# Patient Record
Sex: Male | Born: 1955
Health system: Southern US, Community
[De-identification: ages and names within clinical notes are randomized; demographics above are authoritative.]

## PROBLEM LIST (undated history)

## (undated) ENCOUNTER — Ambulatory Visit: Admission: EM | Payer: BC Managed Care – PPO

## (undated) DIAGNOSIS — Z8701 Personal history of pneumonia (recurrent): Secondary | ICD-10-CM

## (undated) DIAGNOSIS — K5792 Diverticulitis of intestine, part unspecified, without perforation or abscess without bleeding: Secondary | ICD-10-CM

## (undated) DIAGNOSIS — E119 Type 2 diabetes mellitus without complications: Secondary | ICD-10-CM

## (undated) DIAGNOSIS — Z8619 Personal history of other infectious and parasitic diseases: Secondary | ICD-10-CM

## (undated) DIAGNOSIS — I209 Angina pectoris, unspecified: Secondary | ICD-10-CM

## (undated) DIAGNOSIS — E785 Hyperlipidemia, unspecified: Secondary | ICD-10-CM

## (undated) DIAGNOSIS — M1990 Unspecified osteoarthritis, unspecified site: Secondary | ICD-10-CM

## (undated) DIAGNOSIS — I10 Essential (primary) hypertension: Secondary | ICD-10-CM

## (undated) DIAGNOSIS — H40009 Preglaucoma, unspecified, unspecified eye: Secondary | ICD-10-CM

## (undated) DIAGNOSIS — K219 Gastro-esophageal reflux disease without esophagitis: Secondary | ICD-10-CM

## (undated) DIAGNOSIS — I2510 Atherosclerotic heart disease of native coronary artery without angina pectoris: Secondary | ICD-10-CM

## (undated) HISTORY — DX: Unspecified osteoarthritis, unspecified site: M19.90

## (undated) HISTORY — DX: Hyperlipidemia, unspecified: E78.5

## (undated) HISTORY — PX: TONSILLECTOMY: SUR1361

## (undated) HISTORY — DX: Type 2 diabetes mellitus without complications: E11.9

## (undated) HISTORY — DX: Diverticulitis of intestine, part unspecified, without perforation or abscess without bleeding: K57.92

## (undated) HISTORY — PX: SKIN CANCER EXCISION: SHX779

## (undated) HISTORY — DX: Personal history of other infectious and parasitic diseases: Z86.19

## (undated) HISTORY — DX: Personal history of pneumonia (recurrent): Z87.01

## (undated) HISTORY — DX: Essential (primary) hypertension: I10

## (undated) HISTORY — DX: Atherosclerotic heart disease of native coronary artery without angina pectoris: I25.10

## (undated) HISTORY — DX: Preglaucoma, unspecified, unspecified eye: H40.009

---

## 2003-06-14 ENCOUNTER — Other Ambulatory Visit: Payer: Self-pay

## 2005-09-07 ENCOUNTER — Emergency Department: Payer: Self-pay | Admitting: Unknown Physician Specialty

## 2006-07-25 DIAGNOSIS — I2510 Atherosclerotic heart disease of native coronary artery without angina pectoris: Secondary | ICD-10-CM

## 2006-07-25 HISTORY — DX: Atherosclerotic heart disease of native coronary artery without angina pectoris: I25.10

## 2006-07-25 HISTORY — PX: OTHER SURGICAL HISTORY: SHX169

## 2007-03-14 ENCOUNTER — Emergency Department: Payer: Self-pay | Admitting: Emergency Medicine

## 2007-03-20 ENCOUNTER — Ambulatory Visit: Payer: Self-pay | Admitting: Specialist

## 2007-03-26 HISTORY — PX: CORONARY STENT PLACEMENT: SHX1402

## 2007-04-05 ENCOUNTER — Inpatient Hospital Stay: Payer: Self-pay | Admitting: Cardiovascular Disease

## 2007-04-05 ENCOUNTER — Encounter: Payer: Self-pay | Admitting: Cardiovascular Disease

## 2007-04-06 ENCOUNTER — Other Ambulatory Visit: Payer: Self-pay

## 2007-04-06 ENCOUNTER — Encounter: Payer: Self-pay | Admitting: Cardiovascular Disease

## 2007-04-16 ENCOUNTER — Ambulatory Visit (HOSPITAL_COMMUNITY): Admission: RE | Admit: 2007-04-16 | Discharge: 2007-04-16 | Payer: Self-pay | Admitting: *Deleted

## 2007-04-16 ENCOUNTER — Encounter: Payer: Self-pay | Admitting: Cardiovascular Disease

## 2008-12-09 ENCOUNTER — Ambulatory Visit: Payer: Self-pay | Admitting: Gastroenterology

## 2009-02-13 ENCOUNTER — Encounter: Payer: Self-pay | Admitting: Cardiovascular Disease

## 2009-03-21 ENCOUNTER — Emergency Department: Payer: Self-pay | Admitting: Emergency Medicine

## 2009-06-16 ENCOUNTER — Encounter: Payer: Self-pay | Admitting: Cardiovascular Disease

## 2009-06-16 LAB — CONVERTED CEMR LAB
ALT: 45 units/L
AST: 29 units/L
Albumin: 4.6 g/dL
Alkaline Phosphatase: 60 units/L
BUN: 10 mg/dL
CO2: 23 meq/L
Calcium: 9.3 mg/dL
Chloride: 101 meq/L
Creatinine, Ser: 0.86 mg/dL
Glucose, Bld: 87 mg/dL
HCT: 48.8 %
Hemoglobin: 16.2 g/dL
MCV: 90 fL
Potassium: 4.4 meq/L
RBC: 5.42 M/uL
RDW: 13.9 %
Sodium: 134 meq/L
Total Bilirubin: 0.5 mg/dL
Total Protein: 6.8 g/dL
WBC: 7.3 10*3/uL

## 2009-07-06 ENCOUNTER — Encounter: Payer: Self-pay | Admitting: Cardiovascular Disease

## 2009-07-25 HISTORY — PX: COLONOSCOPY: SHX174

## 2009-11-30 ENCOUNTER — Ambulatory Visit: Payer: Self-pay | Admitting: Gastroenterology

## 2009-11-30 LAB — HM COLONOSCOPY

## 2009-12-22 ENCOUNTER — Encounter: Payer: Self-pay | Admitting: Cardiovascular Disease

## 2009-12-22 ENCOUNTER — Ambulatory Visit: Payer: Self-pay

## 2009-12-22 ENCOUNTER — Encounter (HOSPITAL_COMMUNITY): Admission: RE | Admit: 2009-12-22 | Discharge: 2010-02-23 | Payer: Self-pay | Admitting: Cardiovascular Disease

## 2009-12-22 ENCOUNTER — Telehealth (INDEPENDENT_AMBULATORY_CARE_PROVIDER_SITE_OTHER): Payer: Self-pay

## 2009-12-22 ENCOUNTER — Ambulatory Visit: Payer: Self-pay | Admitting: Cardiology

## 2010-01-19 ENCOUNTER — Ambulatory Visit: Payer: Self-pay | Admitting: Gastroenterology

## 2010-02-04 ENCOUNTER — Encounter: Payer: Self-pay | Admitting: Cardiovascular Disease

## 2010-03-01 ENCOUNTER — Encounter: Payer: Self-pay | Admitting: Cardiovascular Disease

## 2010-05-11 ENCOUNTER — Encounter: Payer: Self-pay | Admitting: Cardiovascular Disease

## 2010-06-10 ENCOUNTER — Telehealth: Payer: Self-pay | Admitting: Cardiovascular Disease

## 2010-06-15 ENCOUNTER — Ambulatory Visit: Payer: Self-pay | Admitting: Cardiovascular Disease

## 2010-06-15 DIAGNOSIS — E785 Hyperlipidemia, unspecified: Secondary | ICD-10-CM

## 2010-06-15 DIAGNOSIS — E1169 Type 2 diabetes mellitus with other specified complication: Secondary | ICD-10-CM | POA: Insufficient documentation

## 2010-06-15 DIAGNOSIS — I2510 Atherosclerotic heart disease of native coronary artery without angina pectoris: Secondary | ICD-10-CM | POA: Insufficient documentation

## 2010-06-15 DIAGNOSIS — I10 Essential (primary) hypertension: Secondary | ICD-10-CM | POA: Insufficient documentation

## 2010-06-28 ENCOUNTER — Telehealth: Payer: Self-pay | Admitting: Cardiovascular Disease

## 2010-08-17 ENCOUNTER — Encounter: Payer: Self-pay | Admitting: Cardiovascular Disease

## 2010-08-24 ENCOUNTER — Telehealth: Payer: Self-pay | Admitting: Cardiovascular Disease

## 2010-08-24 NOTE — Assessment & Plan Note (Signed)
Summary: Cardiology Nuclear Study  Nuclear Med Background Indications for Stress Test: Evaluation for Ischemia   History: Heart Catheterization, Myocardial Perfusion Study, Stents  History Comments: 04/06/07 MPS: (+)> Cath: RCA stents x2 overlapping. 04/16/07 Cath: patent RCA stents, residual 60% LAD per intravascular [place], N/O CAD.  Symptoms: Chest Pain, Palpitations    Nuclear Pre-Procedure Cardiac Risk Factors: Hypertension, Lipids, Smoker Caffeine/Decaff Intake: None NPO After: 10:00 PM Lungs: clear IV 0.9% NS with Angio Cath: 18G     IV Site: (R) AC IV Started by: Stanton Kidney EMT-P Chest Size (in) 46-48     Height (in): 69 Weight (lb): 233 BMI: 34.53  Nuclear Med Study 1 or 2 day study:  1 day     Stress Test Type:  Stress Reading MD:  Marca Ancona, MD     Referring MD:  Julien Nordmann Resting Radionuclide:  Technetium 22m Tetrofosmin     Resting Radionuclide Dose:  11 mCi  Stress Radionuclide:  Technetium 49m Tetrofosmin     Stress Radionuclide Dose:  33 mCi   Stress Protocol Exercise Time (min):  9:30 min     Max HR:  160 bpm     Predicted Max HR:  168 bpm       Percent Max HR:  95.24 %     METS: 10.9    Stress Test Technologist:  Milana Na EMT-P     Nuclear Technologist:  Domenic Polite CNMT  Rest Procedure  Myocardial perfusion imaging was performed at rest 45 minutes following the intravenous administration of Myoview Technetium 65m Tetrofosmin.  Stress Procedure  The patient exercised for 9:30. The patient stopped due to fatigue and denied any chest pain.  There were no significant ST-T wave changes and occ pvcs: Bigemeny.  Myoview was injected at peak exercise and myocardial perfusion imaging was performed after a brief delay.  QPS Raw Data Images:  Normal; no motion artifact; normal heart/lung ratio. Stress Images:  Very small, mild apical perfusion defect.  Rest Images:  Small, mild apical perfusion defect and small mid-anterior perfusion defect.    Subtraction (SDS):  Small, mild fixed apical perfusion defect and small mid anterior perfusion defect seen only on rest imaging and likely artifactual.  Transient Ischemic Dilatation:  .92  (Normal <1.22)  Lung/Heart Ratio:  .38  (Normal <0.45)  Quantitative Gated Spect Images QGS EDV:  119 ml QGS ESV:  44 ml QGS EF:  63 % QGS cine images:  Normal wall motion.    Overall Impression  Exercise Capacity: Good exercise capacity. BP Response: Hypertensive blood pressure response. Clinical Symptoms: Mild chest pain even before study begun, no change with exercise.  ECG Impression: Baseline NSR with poor anterior R wave progression.  Borderline significant ST depression in the inferior leads.  Brief ventricular bigeminy during exercise.  Overall Impression: Probable apical thinning with no ischemia and no definite evidence for infarction. The stress perfusion looks near-normal (better than rest imaging).  Overall Impression Comments: Good exercise tolerance with hypertensive BP response.  Borderline ECG changes in inferior leads are nonspecific.  EF and wall motion normal.   Appended Document: Cardiology Nuclear Study I have discussed results with patient. We will monitor his signs for now. medical management

## 2010-08-24 NOTE — Assessment & Plan Note (Signed)
Summary: ROV   Visit Type:  Follow-up Primary Provider:  Orson Aloe, M.D.  CC:  Denies chest pain or shortness of breath..  History of Present Illness: [name] is 55 year old gentleman with known coronary artery disease, stent placed in his proximal to mid RCA in September 2008, remote history of smoking stopped in 08 recent episodes of chest pain earlier this year with negative stress test at that time who presented to reestablish care. He was seen [facility] last in December 2010.  He reports that he has been doing well. He denies any chest pain. 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 He has had back and knee pain. He has trouble sleeping.  LDL in November 2010 with 77.  More recently it was in the 80s. He is an obese and has difficulty with his weight.  Stress test was last in May 2011 showing no significant. Prior stress test was September 2008  Normal sinus rhythm with rate 73 beats per minute, no significant ST or T wave changes  Preventive Screening-Counseling & Management  Alcohol-Tobacco     Smoking Status: never  Caffeine-Diet-Exercise     Does Patient Exercise: yes      Drug Use:  no.    Current Medications (verified): 1)  Plavix 75 Mg Tabs (Clopidogrel Bisulfate) .[name].. 1 Tab Once Daily 2)  Aspirin 325 Mg Tabs (Aspirin) .[name].. 1 Tab Once Daily 3)  Crestor 20 Mg Tabs (Rosuvastatin Calcium) .[name].. 1 Tablet Once Daily With Every Other Day of 40 Mg Qhs 4)  Enalapril Maleate 20 Mg Tabs (Enalapril Maleate) .... One Tablet Once Daily 5)  Nitrostat 0.4 Mg Subl (Nitroglycerin) .[name].. 1 Tablet Under Tongue At Onset of Chest Pain; You May Repeat Every 5 Minutes For Up To 3 Doses. 6)  Omeprazole 40 Mg Cpdr (Omeprazole) .... One Tablet Once Daily 7)  Fish Oil Concentrate 1000 Mg Caps (Omega-3 Fatty Acids) .... One Tablet Two Times A Day 8)  Centrum Silver  Tabs (Multiple  Vitamins-Minerals) .... One Tablet Once Daily 9)  Glucosamine 500 Mg Caps (Glucosamine Sulfate) .... One Tablet Once Daily  Allergies (verified): No Known Drug Allergies  Past History:  Past Surgical History: Last updated: 02/04/2010 Stent placed in September of 2008  Family History: Last updated: 07/06/10 Father: Deceased; old age Mother: Deceased lung & liver cancer   Social History: Last updated: 2010/07/06 Full Time Single  Tobacco Use - No.  Alcohol Use - yes Regular Exercise - yes Drug Use - no  Risk Factors: Exercise: yes (07-06-10)  Risk Factors: Smoking Status: never (07-06-2010)  Past Medical History: CAD Hyperlipidemia Hypertension  Family History: Father: Deceased; old age Mother: Deceased lung & liver cancer   Social History: Full Time Single  Tobacco Use - No.  Alcohol Use - yes Regular Exercise - yes Drug Use - no Smoking Status:  never Does Patient Exercise:  yes Drug Use:  no  Review of Systems  The patient denies fever, weight loss, weight gain, vision loss, decreased hearing, hoarseness, chest pain, syncope, dyspnea on exertion, peripheral edema, prolonged cough, abdominal pain, incontinence, muscle weakness, depression, and enlarged lymph nodes.    Vital Signs:  Patient profile:   55 year old male Height:      69 inches Weight:      232 pounds BMI:     34.38 Pulse rate:   72 / minute BP sitting:  152 / 92  (left arm) Cuff size:   large  Vitals Entered By: Bishop Dublin, CMA (June 15, 2010 10:52 AM)  Physical Exam  General:  Well developed, well nourished, in no acute distress. Head:  normocephalic and atraumatic Neck:  Neck supple, no JVD. No masses, thyromegaly or abnormal cervical nodes. Lungs:  Clear bilaterally to auscultation and percussion. Heart:  Non-displaced PMI, chest non-tender; regular rate and rhythm, S1, S2 without murmurs, rubs or gallops. Carotid upstroke normal, no bruit. Pedals normal pulses. No  edema, no varicosities. Abdomen:  Bowel sounds positive; abdomen soft and non-tender without masses Msk:  Back normal, normal gait. Muscle strength and tone normal. Pulses:  pulses normal in all 4 extremities Extremities:  No clubbing or cyanosis. Neurologic:  Alert and oriented x 3. Skin:  Intact without lesions or rashes. Psych:  Normal affect.   Impression & Recommendations:  Problem # 1:  CAD, NATIVE VESSEL (ICD-414.01) no symptoms of angina at this time. Recent negative stress test  His updated medication list for this problem includes:    Plavix 75 Mg Tabs (Clopidogrel bisulfate) .[name]... 1 tab once daily    Aspirin 325 Mg Tabs (Aspirin) .[name]... 1 tab once daily    Enalapril Maleate 20 Mg Tabs (Enalapril maleate) ..... One tablet once daily    Nitrostat 0.4 Mg Subl (Nitroglycerin) .[name]... 1 tablet under tongue at onset of chest pain; you may repeat every 5 minutes for up to 3 doses.  Problem # 2:  HYPERTENSION, BENIGN (ICD-401.1) Blood pressure is borderline elevated today. He reports good blood pressures at home. We will have him monitor his blood pressure at home.  His updated medication list for this problem includes:    Aspirin 325 Mg Tabs (Aspirin) .[name]... 1 tab once daily    Enalapril Maleate 20 Mg Tabs (Enalapril maleate) ..... One tablet once daily  Problem # 3:  OVERWEIGHT/OBESITY (ICD-278.02) We have encouraged him to exercise, watch his diet in an effort to lose weight  Problem # 4:  HYPERLIPIDEMIA-MIXED (ICD-272.4) cholesterol is not at goal. We have encouraged him to weight  His updated medication list for this problem includes:    Crestor 20 Mg Tabs (Rosuvastatin calcium) .[name]... 1 tablet once daily with every other day of 40 mg qhs  Patient Instructions: 1)  Your physician recommends that you schedule a follow-up appointment in: 1 year 2)  Your physician has recommended you make the following change in your medication: Start taking Cialis 20mg  as needed.    Prescriptions: CIALIS 5 MG TABS (TADALAFIL) Take 1 tablet by mouth once a day  #30 x 0   Entered by:   Cloyde Reams RN   Authorized by:   Dossie Arbour MD   Signed by:   Cloyde Reams RN on 06/15/2010   Method used:   Print then Give to Patient   RxID:   [id number]

## 2010-08-24 NOTE — Progress Notes (Signed)
Summary: Nuc. Pre-Procedure  Phone Note Outgoing Call Call back at [facility] Phone [phone]   Call placed by: Irean Hong, RN,  Dec 22, 2009 10:42 AM Summary of Call: Left message with information on Myoview Information Sheet (see scanned document for details).      Nuclear Med Background Indications for Stress Test: Evaluation for Ischemia   History: Heart Catheterization  History Comments: 04/06/07 RCA stents x2 overlapping. 04/16/07 Cath: patent RCA stents, residual 60% LAD per intravascular [place], N/O CAD.     Nuclear Pre-Procedure Cardiac Risk Factors: Hypertension, Lipids, Smoker

## 2010-08-24 NOTE — Progress Notes (Signed)
Summary: RX  Phone Note Refill Request Call back at Home Phone [phone] Message from:  Patient on June 28, 2010 3:20 PM  Refills Requested: Medication #1:  CRESTOR 20 MG TABS 1 tablet once daily with every other day of 40 mg qhs   Notes: SHOULD BE 40 MG [facility]  Initial call taken by: Harlon Flor,  June 28, 2010 3:21 PM    Prescriptions: CRESTOR 20 MG TABS (ROSUVASTATIN CALCIUM) 1 tablet once daily with every other day of 40 mg qhs  #30 x 6   Entered by:   Lysbeth Galas CMA   Authorized by:   Dossie Arbour MD   Signed by:   Lysbeth Galas CMA on 06/28/2010   Method used:   Electronically to        [facility] [phone]* (retail)       [address] [place], [place]  [address]       Ph: [phone]       Fax: [phone]   RxID:   [id number]   Appended Document: RX Crestor per append to lab result pt should be taking Crestor 40mg  once daily. New Rx sent to Pharmacy. [facility]   Clinical Lists Changes  Medications: Changed medication from CRESTOR 20 MG TABS (ROSUVASTATIN CALCIUM) 1 tablet once daily with every other day of 40 mg qhs to CRESTOR 40 MG TABS (ROSUVASTATIN CALCIUM) Take one tablet by mouth daily. - Signed Rx of CRESTOR 40 MG TABS (ROSUVASTATIN CALCIUM) Take one tablet by mouth daily.;  #30 x 6;  Signed;  Entered by: Lanny Hurst RN;  Authorized by: Dossie Arbour MD;  Method used: Electronically to [facility] [phone]*, [address] [place], [place], [place]  [address], Ph: [phone], Fax: [phone]    Prescriptions: CRESTOR 40 MG TABS (ROSUVASTATIN CALCIUM) Take one tablet by mouth daily.  #30 x 6   Entered by:   Lanny Hurst RN   Authorized by:   Dossie Arbour MD   Signed by:   Lanny Hurst RN on 06/28/2010   Method used:   Electronically to        [facility] [phone]* (retail)       [address] [place], [place]  [address]       Ph: [phone]       Fax: [phone]  RxID:   [id number]

## 2010-08-24 NOTE — Letter (Signed)
Summary: Medical Record Release  Medical Record Release   Imported By: Harlon Flor 06/02/2010 15:54:21  _____________________________________________________________________  External Attachment:    Type:   Image     Comment:   External Document

## 2010-08-24 NOTE — Cardiovascular Report (Signed)
[facility]   [facility] Cath Report   Imported By: Roderic Ovens 06/30/2010 15:30:06  _____________________________________________________________________  External Attachment:    Type:   Image     Comment:   External Document

## 2010-08-24 NOTE — Miscellaneous (Signed)
Summary: Nitro  Clinical Lists Changes  Medications: Added new medication of NITROSTAT 0.4 MG SUBL (NITROGLYCERIN) 1 tablet under tongue at onset of chest pain; you may repeat every 5 minutes for up to 3 doses. - Signed Rx of NITROSTAT 0.4 MG SUBL (NITROGLYCERIN) 1 tablet under tongue at onset of chest pain; you may repeat every 5 minutes for up to 3 doses.;  #25 x 2;  Signed;  Entered by: Benedict Needy, RN;  Authorized by: Dossie Arbour MD;  Method used: Electronically to [facility]. [phone]*, [address] [place], [place], [place]  [address], Ph: [phone], Fax: [phone]    Prescriptions: NITROSTAT 0.4 MG SUBL (NITROGLYCERIN) 1 tablet under tongue at onset of chest pain; you may repeat every 5 minutes for up to 3 doses.  #25 x 2   Entered by:   Benedict Needy, RN   Authorized by:   Dossie Arbour MD   Signed by:   Benedict Needy, RN on 03/01/2010   Method used:   Electronically to        [facility] [phone]* (retail)       [address] [place], [place]  [address]       Ph: [phone]       Fax: [phone]   RxID:   [id number]

## 2010-08-24 NOTE — Progress Notes (Signed)
Summary: RESULTS  Phone Note Call from Patient Call back at [phone]   Caller: Patient Call For: [facility] Summary of Call: PT RETURNED YOUR CALL REGARDING LAB RESULTS Initial call taken by: [place],  June 10, 2010 9:43 AM  Follow-up for Phone Call        Attempted TCB.  LMOM TCB. Cloyde Reams RN  June 10, 2010 10:15 AM  Notified patient of cholesterol results and medications. Follow-up by: Bishop Dublin, CMA,  June 10, 2010 10:23 AM

## 2010-08-24 NOTE — Cardiovascular Report (Signed)
Summary: [facility]   [facility]   Imported By: Roderic Ovens 06/30/2010 16:01:29  _____________________________________________________________________  External Attachment:    Type:   Image     Comment:   External Document

## 2010-08-24 NOTE — Letter (Signed)
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 [address]   [place], [place] [address]   Phone: [phone]  Fax: [phone]    12/22/2009  TO: Eric [name] IT MAY CONCERN   RE: Eric [name] [id number] [id number]   The above named individual is under my medical care and may return to work on: June 1,2011.  Please excuse absences for 12/21/09-12/22/09.  If you have any further questions or need additional information, please call.     Sincerely,    Dr Julien Nordmann

## 2010-08-24 NOTE — Letter (Signed)
Summary: [facility] Office Note   [facility] Office Note   Imported By: Roderic Ovens 06/30/2010 15:31:48  _____________________________________________________________________  External Attachment:    Type:   Image     Comment:   External Document

## 2010-08-28 ENCOUNTER — Encounter: Payer: Self-pay | Admitting: Cardiovascular Disease

## 2010-08-31 ENCOUNTER — Telehealth: Payer: Self-pay | Admitting: Cardiovascular Disease

## 2010-09-01 NOTE — Progress Notes (Signed)
  Phone Note Call from Patient   Caller: Patient Details for Reason: BP medication change. 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  His blood pressure last week while at Dr. Blockers office was 145/90.  Mr. Otting did not start the increase dose of enalapril until today because he wanted to check his blood pressure for a week to see if BP indeed did stay increased and therefore he did get higher readings so he will start the increased dose of enalapril today.  He just wanted to make sure that Dr. Mariah Milling was aware of this and did agree with Dr. Sharrell Ku decision. Initial call taken by: Bishop Dublin, CMA,  August 24, 2010 11:35 AM  Follow-up for Phone Call        I told Mr. Mees that Dr. Mariah Milling would agree to the increased dose of enalapril and to keep a log of blood pressure and heart rate for about two weeks and then contact Dr. Sharrell Ku office if does not see any decrease in BP. Follow-up by: Bishop Dublin, CMA,  August 24, 2010 11:37 AM  Additional Follow-up for Phone Call Additional follow up Details #1::        enalapril 40 is ok.  It may not drop pressure much as it is reaching the upper end. Will probably need to add a low dose diuretic to what he is taking. Would track numbers and call us back over next two weeks     Appended Document:  Spoke to pt, notified him that enalapril 40mg  is ok, he will monitor BP and write results down for the next 2 weeks and call our office.

## 2010-09-09 NOTE — Progress Notes (Signed)
Summary: RX  Phone Note Refill Request Call back at Home Phone [phone] Message from:  Patient on August 31, 2010 1:40 PM  Refills Requested: Medication #1:  PLAVIX 75 MG TABS 1 tab once daily [facility] [address]  Initial call taken by: Harlon Flor,  August 31, 2010 1:40 PM    Prescriptions: PLAVIX 75 MG TABS (CLOPIDOGREL BISULFATE) 1 tab once daily  #30 x 6   Entered by:   Bishop Dublin, CMA   Authorized by:   Dossie Arbour MD   Signed by:   Bishop Dublin, CMA on 08/31/2010   Method used:   Electronically to        [facility] [phone]* (retail)       [address] [place], [place]  [address]       Ph: [phone]       Fax: [phone]   RxID:   [id number]

## 2010-09-28 ENCOUNTER — Telehealth: Payer: Self-pay | Admitting: Cardiovascular Disease

## 2010-10-12 NOTE — Letter (Signed)
Summary: At Home BP Readings  At Home BP Readings   Imported By: Harlon Flor 10/05/2010 10:34:22  _____________________________________________________________________  External Attachment:    Type:   Image     Comment:   External Document  Appended Document: At Home BP Readings Recent phone note states pt's BP's have been high recently and he wants to know if he should be seen. Pt has had recent shoulder injection for bone spurs and believes this pain is main reason BP's are elevated.

## 2010-10-12 NOTE — Progress Notes (Signed)
Summary: BP Readings  Phone Note Call from Patient Call back at Home Phone [phone]   Caller: Self Call For: Gollan Summary of Call: Pt is calling with BP results.  Pt wants to know if he needs to make an appt. Initial call taken by: Harlon Flor,  September 28, 2010 1:57 PM  Follow-up for Phone Call        [facility] TCB Lysbeth Galas [facility]  September 29, 2010 9:32 AM   [facility] TCB Lysbeth Galas CMA  September 30, 2010 2:16 PM   [facility] TCB Lysbeth Galas CMA  October 01, 2010 10:35 AM   Additional Follow-up for Phone Call Additional follow up Details #1::        notified pt to drop BP readings off at office and we will let Dr.Gollan look at them. Will call patient with recommendations. pt will drop of readings tomorrow. Additional Follow-up by: Lysbeth Galas CMA,  October 04, 2010 9:20 AM     Appended Document: BP Readings would start HCTZ 25 mg daily and monitor BP at home  Appended Document: BP Readings The patient wanted you to know has been in a lot of pain with shoulder spurs and has recieved a cortisone shot; thought this may be the reason BP elevated.  Told patient to start HCTZ 25 mg and to monitor BP at home and call with any concerns.  Appended Document: BP Readings    Clinical Lists Changes  Medications: Changed medication from ENALAPRIL MALEATE 20 MG TABS (ENALAPRIL MALEATE) one tablet once daily to ENALAPRIL MALEATE 20 MG TABS (ENALAPRIL MALEATE) take two tablet once daily Added new medication of HYDROCHLOROTHIAZIDE 25 MG TABS (HYDROCHLOROTHIAZIDE) one tablet once daily - Signed Rx of HYDROCHLOROTHIAZIDE 25 MG TABS (HYDROCHLOROTHIAZIDE) one tablet once daily;  #30 x 6;  Signed;  Entered by: Bishop Dublin, CMA;  Authorized by: Dossie Arbour MD;  Method used: Electronically to [facility]. [phone]*, [address] [place], [place], [place]  [address], Ph: [phone], Fax: [phone]    Prescriptions: HYDROCHLOROTHIAZIDE 25 MG TABS (HYDROCHLOROTHIAZIDE) one tablet once daily  #30  x 6   Entered by:   Bishop Dublin, CMA   Authorized by:   Dossie Arbour MD   Signed by:   Bishop Dublin, CMA on 10/08/2010   Method used:   Electronically to        [facility] [phone]* (retail)       [address] [place], [place]  [address]       Ph: [phone]       Fax: [phone]   RxID:   [id number]

## 2010-11-16 ENCOUNTER — Encounter: Payer: Self-pay | Admitting: Cardiovascular Disease

## 2010-12-07 NOTE — Cardiovascular Report (Signed)
NAME:  STEPHANIE, MCGLONE NO.:  [id number]   MEDICAL RECORD NO.:  [id number]          PATIENT TYPE:  OIB   LOCATION:  2867                         FACILITY:  [facility]   PHYSICIAN:  Darlin Priestly, MD  DATE OF BIRTH:  [birth year]   DATE OF PROCEDURE:  04/16/2007  DATE OF DISCHARGE:                            CARDIAC CATHETERIZATION   PROCEDURES:  1. Coronary angiography.  2. LAD-mid - Intravascular ultrasound imaging.   ATTENDING:  Dr. Lenise Herald   COMPLICATIONS:  None.   INDICATION:  Mr. Kras is a 55 year old male patient of Dr. Dossie Arbour and Dr. Orson Aloe, both in [place], with a history of  hypertension, tobacco use, hyperlipidemia who underwent cardiac  catheterization in [facility] on April 05, 2007 revealing  significant disease in the RCA as well as mid-LAD.  He did undergo  stenting of his RCA by Dr. Jacinto Halim on April 06, 2007.  Since that  time, he has had no recurrent chest pain.  He is now brought for  intravascular ultrasound imaging of the LAD.   DESCRIPTION OF OPERATION:  After obtaining informed consent, the patient  was brought to the cardiac cath lab where the right groin was shaved,  prepped and draped in a sterile fashion.  Anesthesia monitor  established.  Utilizing the modified Seldinger technique, a #6  intraarterial sheath was inserted into the right femoral artery.  A 6-  [place] JR-4 diagnostic catheter was engaged in the right coronary  ostium.   The right coronary artery is a large vessel, it is dominant and gives  rise to both PDA as well as right lateral branch and has two overlapping  stents noted.  There is mild 30% proximal RCA narrowing.  The stents are  widely patent.  There is mild 30% distal RCA irregularities.  The PDA  and posterolateral branch have no evidence of disease.   A 6-French JL-4 is then used to perform left coronary angiography.   The left main is a large vessel with no significant  disease.   The LAD is a large vessel which courses to the apex and gives rise to  one diagonal branch.  There is mild calcification noted in the early mid-  portion of the LAD.   The left coronary artery also gives rise to a medium-size ramus  intermedius 40% ostial lesion.   The circumflex is a medium-size vessel which gives rise to one obtuse  marginal branch.  There is mild 30% proximal circumflex narrowing.   The first OM is a medium-size vessel with no significant disease.   Following the diagnostic angiography, a #6-French JL-4 guiding catheter  with side-holes was coaxially engaged in the left coronary ostium.  Next, a 0.014 Forte __________ wire was advanced out of the guiding  catheter and positioned in the distal LAD without difficulty.  A 4  megahertz IVUS imaging catheter was advanced out of the guiding catheter  and used to cross the mid-LAD stenotic lesion.  Mechanical pullback was  then performed throughout the mid and proximal LAD.  The vessel diameter  at the area of stenosis was  approximately 4.0 x 4.0 mm.  The  intraluminal diameter at the tightest area was 2.1 x 1.9 mm.  It should  be noted that the LAD had mild diffuse disease all the way back to the  left main.  This gave approximately 60% vessel stenosis.   5000 units of intravenous heparin given prior to the IVUS imaging.   Final orthogonal angiograms reveal approximately 60% residual stenosis  in the LAD with TIMI-3 flow to the distal vessel.  At this point,  elected to conclude the procedure.  All balloons, wires and catheters  were removed.  Hemostatic sheath was sewn in place and the patient  transferred directly to the ward in stable condition.   CONCLUSIONS:  Successful intravascular ultrasound imaging of the mid-LAD  stenotic lesion revealing approximately 60% calcified stenosis in the  mid-LAD.      Darlin Priestly, MD  Electronically Signed     RHM/MEDQ  D:  04/16/2007  T:  04/16/2007   Job:  [id number]   cc:   Orson Aloe, MD  Antonieta Iba, MD

## 2011-02-08 ENCOUNTER — Encounter: Payer: Self-pay | Admitting: Cardiovascular Disease

## 2011-02-22 ENCOUNTER — Ambulatory Visit: Payer: Self-pay | Admitting: Cardiovascular Disease

## 2011-02-23 ENCOUNTER — Ambulatory Visit (INDEPENDENT_AMBULATORY_CARE_PROVIDER_SITE_OTHER): Payer: BC Managed Care – PPO | Admitting: Cardiovascular Disease

## 2011-02-23 ENCOUNTER — Encounter: Payer: Self-pay | Admitting: Cardiovascular Disease

## 2011-02-23 DIAGNOSIS — E663 Overweight: Secondary | ICD-10-CM

## 2011-02-23 DIAGNOSIS — I2510 Atherosclerotic heart disease of native coronary artery without angina pectoris: Secondary | ICD-10-CM

## 2011-02-23 DIAGNOSIS — I10 Essential (primary) hypertension: Secondary | ICD-10-CM

## 2011-02-23 DIAGNOSIS — E785 Hyperlipidemia, unspecified: Secondary | ICD-10-CM

## 2011-02-23 MED ORDER — TADALAFIL 5 MG PO TABS: 5.0000 mg | ORAL_TABLET | Freq: Every day | ORAL | Status: DC

## 2011-02-23 NOTE — Patient Instructions (Signed)
You are doing well. No medication changes were made. Please call us if you have new issues that need to be addressed before your next appt.  We will call you for a follow up Appt. In 12 months  

## 2011-02-23 NOTE — Assessment & Plan Note (Signed)
Currently with no symptoms of angina. No further workup at this time. Continue current medication regimen. 

## 2011-02-23 NOTE — Progress Notes (Signed)
Patient ID: Eric [name], male    DOB: 04-Jan-1956, 55 y.o.   MRN: [id number]  HPI Comments: Eric [name] is 55 year old gentleman with coronary artery disease, stent placed in his proximal to mid RCA in September 2008, remote history of smoking stopped in 08, episodes of chest pain in 2011 with negative stress test at that time who presents for routine followup.    He reports that he has been doing well. He denies any chest pain. He has been working as a [employer]. 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  He has trouble sleeping. He does not exercise, snacks on cookies and other fast food and has had continued problems with his weight gain. He reports that his weight is the heaviest today that it has been a while. He just had fast food for lunch.   Stress test was last in May 2011 showing no significant. Prior stress test was September 2008   Normal sinus rhythm with rate 81 beats per minute, no significant ST or T wave changes      Outpatient Encounter Prescriptions as of 02/23/2011  Medication Sig Dispense Refill  . aspirin 325 MG tablet Take 325 mg by mouth daily.        . clopidogrel (PLAVIX) 75 MG tablet Take 75 mg by mouth daily.        . DiphenhydrAMINE HCl (BENADRYL ALLERGY PO) Take by mouth.        . enalapril (VASOTEC) 20 MG tablet Take 40 mg by mouth daily.        . Glucosamine 500 MG CAPS Take 500 mg by mouth daily.        . hydrochlorothiazide 25 MG tablet Take 25 mg by mouth daily.        . Melatonin 3 MG TABS Take 3 mg by mouth once.        . Multiple Vitamins-Minerals (CENTRUM SILVER) tablet Take 1 tablet by mouth daily.        . nitroGLYCERIN (NITROSTAT) 0.4 MG SL tablet Place 0.4 mg under the tongue every 5 (five) minutes as needed. For up to 3 doses.       . Omega-3 Fatty Acids (FISH OIL CONCENTRATE) 1000 MG CAPS Take 1,000 mg by mouth 2 (two) times daily.        [name] omeprazole (PRILOSEC) 40 MG capsule Take 40 mg by mouth daily.        .  rosuvastatin (CRESTOR) 40 MG tablet Take 40 mg by mouth daily.        . tadalafil (CIALIS) 5 MG tablet Take 1 tablet (5 mg total) by mouth daily.  30 tablet  11    Review of Systems  Constitutional: Positive for unexpected weight change.  HENT: Negative.   Eyes: Negative.   Respiratory: Negative.   Cardiovascular: Negative.   Gastrointestinal: Negative.   Musculoskeletal: Negative.   Skin: Negative.   Neurological: Negative.   Hematological: Negative.   Psychiatric/Behavioral: Negative.   All other systems reviewed and are negative.    BP 145/82  Pulse 85  Ht 5\' 9"  (1.753 m)  Wt 247 lb (112.038 kg)  BMI 36.48 kg/m2  Physical Exam  Nursing note and vitals reviewed. Constitutional: He is oriented to person, place, and time. He appears well-developed and well-nourished.       obese  HENT:  Head: Normocephalic.  Nose: Nose normal.  Mouth/Throat: Oropharynx is clear and moist.  Eyes: Conjunctivae are normal. Pupils are  equal, round, and reactive to light.  Neck: Normal range of motion. Neck supple. No JVD present.  Cardiovascular: Normal rate, regular rhythm, S1 normal, S2 normal, normal heart sounds and intact distal pulses.  Exam reveals no gallop and no friction rub.   No murmur heard. Pulmonary/Chest: Effort normal and breath sounds normal. No respiratory distress. He has no wheezes. He has no rales. He exhibits no tenderness.  Abdominal: Soft. Bowel sounds are normal. He exhibits no distension. There is no tenderness.  Musculoskeletal: Normal range of motion. He exhibits no edema and no tenderness.  Lymphadenopathy:    He has no cervical adenopathy.  Neurological: He is alert and oriented to person, place, and time. Coordination normal.  Skin: Skin is warm and dry. No rash noted. No erythema.  Psychiatric: He has a normal mood and affect. His behavior is normal. Judgment and thought content normal.           Assessment and Plan

## 2011-02-23 NOTE — Assessment & Plan Note (Signed)
Cholesterol is at goal on the current lipid regimen. No changes to the medications were made.  

## 2011-02-23 NOTE — Assessment & Plan Note (Signed)
We stand most of his visit talking about his weight and how to change his diet. I suggested he refrain from cookies while driving, have also given him a dietary diet to follow. He does not have time to exercise on the work week but does only work a four-day week and has 3 days off where he could exercise.

## 2011-02-23 NOTE — Assessment & Plan Note (Signed)
Blood pressure is well controlled on today's visit. No changes made to the medications. 

## 2011-03-23 ENCOUNTER — Telehealth: Payer: Self-pay

## 2011-03-23 MED ORDER — CLOPIDOGREL BISULFATE 75 MG PO TABS: 75.0000 mg | ORAL_TABLET | Freq: Every day | ORAL | Status: DC

## 2011-03-23 NOTE — Telephone Encounter (Signed)
Requested refill for plavix 75 mg take one tablet daily.

## 2011-04-22 ENCOUNTER — Telehealth: Payer: Self-pay

## 2011-04-22 MED ORDER — ROSUVASTATIN CALCIUM 40 MG PO TABS: 40.0000 mg | ORAL_TABLET | Freq: Every day | ORAL | Status: DC

## 2011-04-22 MED ORDER — HYDROCHLOROTHIAZIDE 25 MG PO TABS: 25.0000 mg | ORAL_TABLET | Freq: Every day | ORAL | Status: DC

## 2011-04-22 NOTE — Telephone Encounter (Signed)
Refill sent for crestor 40 mg take one tablet daily & hydrochlorothiazide.

## 2011-05-05 LAB — BASIC METABOLIC PANEL
BUN: 8
CO2: 26
Calcium: 9.3
Chloride: 103
Creatinine, Ser: 0.88
GFR calc Af Amer: >60
GFR calc non Af Amer: >60
Glucose, Bld: 92
Potassium: 4
Sodium: 138

## 2011-05-05 LAB — PROTIME-INR
INR: 0.9
Prothrombin Time: 12.7

## 2011-05-05 LAB — CBC
HCT: 47.5
Hemoglobin: 16.3
MCHC: 34.2
MCV: 91.7
Platelets: 258
RBC: 5.18
RDW: 13.4
WBC: 8.5

## 2011-05-05 LAB — APTT: aPTT: 31

## 2011-10-19 ENCOUNTER — Other Ambulatory Visit: Payer: Self-pay | Admitting: *Deleted

## 2011-10-19 MED ORDER — CLOPIDOGREL BISULFATE 75 MG PO TABS: 75.0000 mg | ORAL_TABLET | Freq: Every day | ORAL | Status: DC

## 2011-10-28 ENCOUNTER — Emergency Department: Payer: Self-pay | Admitting: Emergency Medicine

## 2011-10-28 LAB — CBC
HCT: 44.8 % (ref 40.0–52.0)
HGB: 15 g/dL (ref 13.0–18.0)
MCH: 29.9 pg (ref 26.0–34.0)
MCHC: 33.5 g/dL (ref 32.0–36.0)
MCV: 89 fL (ref 80–100)
Platelet: 172 10*3/uL (ref 150–440)
RBC: 5.02 10*6/uL (ref 4.40–5.90)
RDW: 14 % (ref 11.5–14.5)
WBC: 5.1 10*3/uL (ref 3.8–10.6)

## 2011-10-28 LAB — URINALYSIS, COMPLETE
Bacteria: NONE SEEN
Bilirubin,UR: NEGATIVE
Blood: NEGATIVE
Glucose,UR: NEGATIVE mg/dL (ref 0–75)
Ketone: NEGATIVE
Leukocyte Esterase: NEGATIVE
Nitrite: NEGATIVE
Ph: 7 (ref 4.5–8.0)
Protein: NEGATIVE
RBC,UR: 1 /HPF (ref 0–5)
Specific Gravity: 1.008 (ref 1.003–1.030)
Squamous Epithelial: NONE SEEN
WBC UR: NONE SEEN /HPF (ref 0–5)

## 2011-10-28 LAB — COMPREHENSIVE METABOLIC PANEL
Albumin: 4.1 g/dL (ref 3.4–5.0)
Alkaline Phosphatase: 62 U/L (ref 50–136)
Anion Gap: 9 (ref 7–16)
BUN: 9 mg/dL (ref 7–18)
Bilirubin,Total: 0.3 mg/dL (ref 0.2–1.0)
Calcium, Total: 9 mg/dL (ref 8.5–10.1)
Chloride: 101 mmol/L (ref 98–107)
Co2: 26 mmol/L (ref 21–32)
Creatinine: 0.92 mg/dL (ref 0.60–1.30)
EGFR (African American): >60
EGFR (Non-African Amer.): >60
Glucose: 97 mg/dL (ref 65–99)
Osmolality: 271 (ref 275–301)
Potassium: 3.8 mmol/L (ref 3.5–5.1)
SGOT(AST): 76 U/L — ABNORMAL HIGH (ref 15–37)
SGPT (ALT): 109 U/L — ABNORMAL HIGH
Sodium: 136 mmol/L (ref 136–145)
Total Protein: 7.7 g/dL (ref 6.4–8.2)

## 2011-10-28 LAB — LIPASE, BLOOD: Lipase: 103 U/L (ref 73–393)

## 2011-11-18 ENCOUNTER — Telehealth: Payer: Self-pay

## 2011-11-18 MED ORDER — ROSUVASTATIN CALCIUM 40 MG PO TABS: 40.0000 mg | ORAL_TABLET | Freq: Every day | ORAL | Status: DC

## 2011-11-18 MED ORDER — CLOPIDOGREL BISULFATE 75 MG PO TABS: 75.0000 mg | ORAL_TABLET | Freq: Every day | ORAL | Status: DC

## 2011-11-18 MED ORDER — HYDROCHLOROTHIAZIDE 25 MG PO TABS: 25.0000 mg | ORAL_TABLET | Freq: Every day | ORAL | Status: DC

## 2011-11-18 NOTE — Telephone Encounter (Signed)
Refill sent for plavix, hydrochlorothiazide, and crestor.

## 2011-11-21 ENCOUNTER — Telehealth: Payer: Self-pay | Admitting: Cardiovascular Disease

## 2011-11-21 NOTE — Telephone Encounter (Signed)
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 hasnt has one in 2 years. Pt needs it on a Tuesday.

## 2011-11-22 NOTE — Telephone Encounter (Signed)
Would you like to order the stress test?

## 2011-11-23 NOTE — Telephone Encounter (Signed)
LMOM to have patient call to set up GXT in office per Dr. Mariah Milling.

## 2011-11-23 NOTE — Telephone Encounter (Signed)
We could schedule this on a Tuesday  Treadmill in the office with me

## 2011-11-24 ENCOUNTER — Other Ambulatory Visit: Payer: Self-pay

## 2011-11-24 DIAGNOSIS — I2510 Atherosclerotic heart disease of native coronary artery without angina pectoris: Secondary | ICD-10-CM

## 2011-11-24 NOTE — Telephone Encounter (Signed)
Eric [name]

## 2011-11-24 NOTE — Telephone Encounter (Signed)
Notified patient need to have GXT with Dr. Mariah Milling. The patient states does not need for another 3 months.

## 2012-01-16 ENCOUNTER — Encounter: Payer: Self-pay | Admitting: Cardiovascular Disease

## 2012-01-16 ENCOUNTER — Ambulatory Visit (INDEPENDENT_AMBULATORY_CARE_PROVIDER_SITE_OTHER): Payer: BC Managed Care – PPO | Admitting: Cardiovascular Disease

## 2012-01-16 VITALS — BP 100/70 | Ht 69.0 in | Wt 237.0 lb

## 2012-01-16 DIAGNOSIS — I2510 Atherosclerotic heart disease of native coronary artery without angina pectoris: Secondary | ICD-10-CM

## 2012-01-16 MED ORDER — NITROGLYCERIN 0.4 MG SL SUBL: 0.4000 mg | SUBLINGUAL_TABLET | SUBLINGUAL | Status: DC | PRN

## 2012-01-16 MED ORDER — TADALAFIL 5 MG PO TABS: 5.0000 mg | ORAL_TABLET | Freq: Every day | ORAL | Status: DC

## 2012-01-16 NOTE — Procedures (Signed)
Exercise Treadmill Test  Treadmill ordered for DOT physical  Resting EKG shows NSR with rate of 78 bpm, T wave abn in V6, II, III, aVF Resting blood pressure of 100/70 Stand bruce protocal was used.  Patient exercised for 9 min 25 sec,  Peak heart rate of 144 bpm.  This was 85% of the maximum predicted heart rate (target heart rate 141). Achieved 12 METS No symptoms of chest pain or lightheadedness were reported at peak stress or in recovery.  Peak Blood pressure recorded was 158/74. Heart rate at 3 minutes in recovery was 100 bpm.  FINAL IMPRESSION: Normal exercise stress test. No significant EKG changes concerning for ischemia. Excellent exercise tolerance.

## 2012-01-16 NOTE — Patient Instructions (Addendum)
Normal stress test No further testing needed. Cleared for DOT, no further work up needed

## 2012-06-08 ENCOUNTER — Emergency Department: Payer: Self-pay | Admitting: Emergency Medicine

## 2012-06-08 LAB — URINALYSIS, COMPLETE
Bacteria: NONE SEEN
Bilirubin,UR: NEGATIVE
Blood: NEGATIVE
Glucose,UR: NEGATIVE mg/dL (ref 0–75)
Ketone: NEGATIVE
Leukocyte Esterase: NEGATIVE
Nitrite: NEGATIVE
Ph: 7 (ref 4.5–8.0)
Protein: NEGATIVE
RBC,UR: 1 /HPF (ref 0–5)
Specific Gravity: 1.011 (ref 1.003–1.030)
Squamous Epithelial: NONE SEEN
WBC UR: NONE SEEN /HPF (ref 0–5)

## 2012-06-08 LAB — COMPREHENSIVE METABOLIC PANEL
Albumin: 3.9 g/dL (ref 3.4–5.0)
Alkaline Phosphatase: 68 U/L (ref 50–136)
Anion Gap: 9 (ref 7–16)
BUN: 9 mg/dL (ref 7–18)
Bilirubin,Total: 0.4 mg/dL (ref 0.2–1.0)
Calcium, Total: 8.8 mg/dL (ref 8.5–10.1)
Chloride: 104 mmol/L (ref 98–107)
Co2: 23 mmol/L (ref 21–32)
Creatinine: 0.84 mg/dL (ref 0.60–1.30)
EGFR (African American): >60
EGFR (Non-African Amer.): >60
Glucose: 90 mg/dL (ref 65–99)
Osmolality: 270 (ref 275–301)
Potassium: 3.6 mmol/L (ref 3.5–5.1)
SGOT(AST): 67 U/L — ABNORMAL HIGH (ref 15–37)
SGPT (ALT): 92 U/L — ABNORMAL HIGH (ref 12–78)
Sodium: 136 mmol/L (ref 136–145)
Total Protein: 7.9 g/dL (ref 6.4–8.2)

## 2012-06-08 LAB — CBC
HCT: 46.7 % (ref 40.0–52.0)
HGB: 16.3 g/dL (ref 13.0–18.0)
MCH: 30.7 pg (ref 26.0–34.0)
MCHC: 35 g/dL (ref 32.0–36.0)
MCV: 88 fL (ref 80–100)
Platelet: 246 10*3/uL (ref 150–440)
RBC: 5.33 10*6/uL (ref 4.40–5.90)
RDW: 14.2 % (ref 11.5–14.5)
WBC: 9 10*3/uL (ref 3.8–10.6)

## 2012-06-08 LAB — LIPASE, BLOOD: Lipase: 140 U/L (ref 73–393)

## 2012-06-18 ENCOUNTER — Other Ambulatory Visit: Payer: Self-pay | Admitting: Cardiovascular Disease

## 2012-06-18 NOTE — Telephone Encounter (Signed)
Refill sent for HCTZ 

## 2012-07-03 ENCOUNTER — Other Ambulatory Visit: Payer: Self-pay | Admitting: Cardiovascular Disease

## 2012-07-23 ENCOUNTER — Telehealth: Payer: Self-pay

## 2012-07-23 NOTE — Telephone Encounter (Signed)
I can see him once a year

## 2012-07-23 NOTE — Telephone Encounter (Signed)
Patient calling to see if Dr. 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 He thought the stress test was like a follow up appointment. Please advise.

## 2012-08-02 NOTE — Telephone Encounter (Signed)
Notified patient Dr. Mariah Milling will see him once a year for a follow up. Told patient we will have a letter mailed to him 2 months prior to his recall in Aug. 2014.

## 2012-09-22 LAB — HM DIABETES EYE EXAM

## 2012-11-13 ENCOUNTER — Other Ambulatory Visit: Payer: Self-pay | Admitting: Cardiovascular Disease

## 2013-01-16 ENCOUNTER — Other Ambulatory Visit: Payer: Self-pay | Admitting: Cardiovascular Disease

## 2013-01-17 ENCOUNTER — Telehealth (HOSPITAL_COMMUNITY): Payer: Self-pay | Admitting: *Deleted

## 2013-01-17 NOTE — Telephone Encounter (Signed)
PT. NEEDS TO MAKE AN APPT. BEFORE MORE REFILLS. Nina,CMA.

## 2013-01-17 NOTE — Telephone Encounter (Signed)
Refill sent for CRESTOR 40 MG, HYDROCHLOROTHIAZLDE 25 MG, AND NITROSTAT 0.4 MG. Coralee North, CMA

## 2013-01-18 ENCOUNTER — Telehealth: Payer: Self-pay | Admitting: *Deleted

## 2013-01-18 NOTE — Telephone Encounter (Signed)
[facility] Would like to talk to Dr. Mariah Milling about the Plavix and his genetic testing.

## 2013-01-18 NOTE — Telephone Encounter (Signed)
See below

## 2013-01-23 NOTE — Telephone Encounter (Signed)
Maybe we can try to obtain a copy of the test Would probably not use effient, Would go with 81 mg aspirin with brilinta  Perhaps we should talk about this in clinic May be able to go with aspirin alone

## 2013-01-23 NOTE — Telephone Encounter (Signed)
Will wait and address at OV 7/8 Faxing [place] lab results

## 2013-01-23 NOTE — Telephone Encounter (Signed)
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 Mariah Milling to review and suggests we change pt to Effient since Plavix in ineffective based on lab results Pt will remain on Plavix until Dr. Mariah Milling gives suggestion to change him to another medication Will await lab results He is faxing now

## 2013-01-23 NOTE — Telephone Encounter (Signed)
Pharmacy calling back about pt plavix

## 2013-01-29 ENCOUNTER — Ambulatory Visit (INDEPENDENT_AMBULATORY_CARE_PROVIDER_SITE_OTHER): Payer: BC Managed Care – PPO | Admitting: Cardiovascular Disease

## 2013-01-29 ENCOUNTER — Encounter: Payer: Self-pay | Admitting: Cardiovascular Disease

## 2013-01-29 VITALS — BP 120/82 | HR 79 | Ht 69.0 in | Wt 243.5 lb

## 2013-01-29 DIAGNOSIS — I10 Essential (primary) hypertension: Secondary | ICD-10-CM

## 2013-01-29 DIAGNOSIS — E119 Type 2 diabetes mellitus without complications: Secondary | ICD-10-CM

## 2013-01-29 DIAGNOSIS — E785 Hyperlipidemia, unspecified: Secondary | ICD-10-CM

## 2013-01-29 DIAGNOSIS — E663 Overweight: Secondary | ICD-10-CM

## 2013-01-29 DIAGNOSIS — E118 Type 2 diabetes mellitus with unspecified complications: Secondary | ICD-10-CM | POA: Insufficient documentation

## 2013-01-29 DIAGNOSIS — I2510 Atherosclerotic heart disease of native coronary artery without angina pectoris: Secondary | ICD-10-CM

## 2013-01-29 DIAGNOSIS — E1169 Type 2 diabetes mellitus with other specified complication: Secondary | ICD-10-CM | POA: Insufficient documentation

## 2013-01-29 MED ORDER — METFORMIN HCL 500 MG PO TABS: 1500.0000 mg | ORAL_TABLET | Freq: Three times a day (TID) | ORAL | Status: DC

## 2013-01-29 MED ORDER — TADALAFIL 5 MG PO TABS: 5.0000 mg | ORAL_TABLET | Freq: Every day | ORAL | Status: DC | PRN

## 2013-01-29 NOTE — Assessment & Plan Note (Signed)
We have encouraged continued exercise, careful diet management in an effort to lose weight. 

## 2013-01-29 NOTE — Assessment & Plan Note (Signed)
Blood pressure is well controlled on today's visit. No changes made to the medications. 

## 2013-01-29 NOTE — Progress Notes (Signed)
Patient ID: Eric [name], male    DOB: Dec 15, 1955, 57 y.o.   MRN: [id number]  HPI Comments: Mr. Azer is 57 year old gentleman with coronary artery disease, stent placed in his proximal to mid RCA in September 2008, remote history of smoking stopped in 08, episodes of chest pain in 2011 with negative stress test at that time who presents for routine followup. Recent diagnosis of diabetes, hemoglobin A1c 7.4. Problem with his weight.   He continues to work as a [employer], has difficult work shifts at all times. Diet is poor in general. Typically he eats what he wants. Denies any significant chest pain or shortness of breath with exertion. He eats out and has fast food frequently for lunch and dinner. Blood pressure has been well-controlled.  Recent laboratory hemoglobin A1c 7.4, cholesterol well-controlled, less than 120 with LDL 54. 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   Stress test was last in May 2011 showing no significant. Prior stress test was September 2008   Normal sinus rhythm with rate 879 beats per minute, nonspecific T wave abnormality in V5 and V6, 3 and aVF      Outpatient Encounter Prescriptions as of 01/29/2013  Medication Sig Dispense Refill  . aspirin 162 MG EC tablet Take 162 mg by mouth daily.      . clopidogrel (PLAVIX) 75 MG tablet take 1 tablet by mouth once daily  30 tablet  6  . CRESTOR 40 MG tablet take 1 tablet by mouth once daily  30 tablet  0  . cyclobenzaprine (FLEXERIL) 10 MG tablet Take 10 mg by mouth as needed.       . diphenhydrAMINE (SOMINEX) 25 MG tablet Take 25 mg by mouth at bedtime as needed.      . DiphenhydrAMINE HCl (BENADRYL ALLERGY PO) Take by mouth.        Tery Sanfilippo Calcium (STOOL SOFTENER PO) Take by mouth daily.      . enalapril (VASOTEC) 20 MG tablet  Take 40 mg by mouth daily.        . Glucosamine 500 MG CAPS Take 3 tablets daily.      . hydrochlorothiazide (HYDRODIURIL) 25 MG tablet take 1 tablet by mouth once daily  30 tablet  0  . HYDROcodone-acetaminophen (VICODIN) 5-500 MG per tablet Take 1 tablet by mouth every 6 (six) hours as needed.      . Multiple Vitamins-Minerals (CENTRUM SILVER) tablet Take 1 tablet by mouth daily.        [name] NITROSTAT 0.4 MG SL tablet place 1 tablet under the tongue if needed every 5 minutes for chest pain for 3 doses IF NO RELIEF AFTER 3RD DOSE CALL PRESCRIBER OR 911.  25 tablet  0  . Omega-3 Fatty Acids (FISH OIL CONCENTRATE) 1000 MG CAPS Take 1,200 mg by mouth 2 (two) times daily.       [name] omeprazole (PRILOSEC) 40 MG capsule Take 40 mg by mouth daily.        . metFORMIN (GLUCOPHAGE) 500 MG tablet Take 500 mg by mouth.       Review of Systems  Constitutional: Negative.   HENT: Negative.   Eyes: Negative.   Respiratory: Negative.   Cardiovascular: Negative.   Gastrointestinal: Negative.   Musculoskeletal: Negative.   Skin: Negative.   Neurological:  Negative.   Psychiatric/Behavioral: Negative.   All other systems reviewed and are negative.    BP 120/82  Pulse 79  Ht 5\' 9"  (1.753 m)  Wt 243 lb 8 oz (110.451 kg)  BMI 35.94 kg/m2  Physical Exam  Nursing note and vitals reviewed. Constitutional: He is oriented to person, place, and time. He appears well-developed and well-nourished.  HENT:  Head: Normocephalic.  Nose: Nose normal.  Mouth/Throat: Oropharynx is clear and moist.  Eyes: Conjunctivae are normal. Pupils are equal, round, and reactive to light.  Neck: Normal range of motion. Neck supple. No JVD present.  Cardiovascular: Normal rate, regular rhythm, S1 normal, S2 normal, normal heart sounds and intact distal pulses.  Exam reveals no gallop and no friction rub.   No murmur heard. Pulmonary/Chest: Effort normal and breath sounds normal. No respiratory distress. He has no wheezes. He has no  rales. He exhibits no tenderness.  Abdominal: Soft. Bowel sounds are normal. He exhibits no distension. There is no tenderness.  Musculoskeletal: Normal range of motion. He exhibits no edema and no tenderness.  Lymphadenopathy:    He has no cervical adenopathy.  Neurological: He is alert and oriented to person, place, and time. Coordination normal.  Skin: Skin is warm and dry. No rash noted. No erythema.  Psychiatric: He has a normal mood and affect. His behavior is normal. Judgment and thought content normal.      Assessment and Plan

## 2013-01-29 NOTE — Patient Instructions (Addendum)
You are doing well.  Please increase the metformin to 500 mg two to three times a day No bread, low carbo diet Continue on aspirin 81 mg x 2 with plavix  Please call us if you have new issues that need to be addressed before your next appt.  Your physician wants you to follow-up in: 12 months.  You will receive a reminder letter in the mail two months in advance. If you don't receive a letter, please call our office to schedule the follow-up appointment.

## 2013-01-29 NOTE — Assessment & Plan Note (Signed)
Hemoglobin A1c 7.4. Will increase his metformin to 500 mg twice a day, prescription written for 3 times a day depending on his diet (suggested he take this 3 times a day if he continues to eat poorly).

## 2013-01-29 NOTE — Assessment & Plan Note (Signed)
Cholesterol is at goal on the current lipid regimen. No changes to the medications were made.  

## 2013-01-29 NOTE — Assessment & Plan Note (Signed)
Currently with no symptoms of angina. No further workup at this time. Continue current medication regimen. Nonspecific abnormality on his EKG.Eric [name]

## 2013-03-18 ENCOUNTER — Other Ambulatory Visit: Payer: Self-pay | Admitting: Cardiovascular Disease

## 2013-03-18 NOTE — Telephone Encounter (Signed)
Refill Hydrochlorothiazide #30 R#5 and Crestor #30 R#5 sent to [facility].

## 2013-04-10 ENCOUNTER — Other Ambulatory Visit: Payer: Self-pay

## 2013-04-10 ENCOUNTER — Ambulatory Visit: Payer: BC Managed Care – PPO | Admitting: Family Medicine

## 2013-04-10 LAB — HEMOGLOBIN A1C
Est. average glucose Bld gHb Est-mCnc: 134 mg/dL
Hgb A1c MFr Bld: 6.3 % — ABNORMAL HIGH (ref 4.8–5.6)

## 2013-04-16 ENCOUNTER — Ambulatory Visit: Payer: BC Managed Care – PPO | Admitting: Family Medicine

## 2013-04-19 ENCOUNTER — Telehealth: Payer: Self-pay

## 2013-04-19 NOTE — Telephone Encounter (Signed)
Message copied by Marilynne Halsted on Fri Apr 19, 2013 10:17 AM ------      Message from: Antonieta Iba      Created: Fri Apr 19, 2013  7:48 AM       Diabetes number is great, <6.5      Keep up the good work ------

## 2013-04-22 NOTE — Telephone Encounter (Signed)
Spoke w/ pt.  He is aware of results.  States that he has lost 20 lbs recently, as he "really wants to come off of that medicine".

## 2013-05-07 ENCOUNTER — Ambulatory Visit: Payer: BC Managed Care – PPO | Admitting: Family Medicine

## 2013-05-28 ENCOUNTER — Other Ambulatory Visit: Payer: Self-pay | Admitting: *Deleted

## 2013-05-28 ENCOUNTER — Other Ambulatory Visit: Payer: Self-pay | Admitting: Cardiovascular Disease

## 2013-05-28 MED ORDER — CLOPIDOGREL BISULFATE 75 MG PO TABS: ORAL_TABLET | ORAL | Status: DC

## 2013-05-28 NOTE — Telephone Encounter (Signed)
Requested Prescriptions   Signed Prescriptions Disp Refills  . clopidogrel (PLAVIX) 75 MG tablet 30 tablet 6    Sig: take 1 tablet by mouth once daily    Authorizing Provider: Antonieta Iba    Ordering User: Kendrick Fries

## 2013-07-09 ENCOUNTER — Encounter: Payer: Self-pay | Admitting: Family Medicine

## 2013-07-09 ENCOUNTER — Ambulatory Visit (INDEPENDENT_AMBULATORY_CARE_PROVIDER_SITE_OTHER): Payer: BC Managed Care – PPO | Admitting: Family Medicine

## 2013-07-09 VITALS — BP 124/78 | HR 68 | Temp 97.7°F | Ht 68.5 in | Wt 221.8 lb

## 2013-07-09 DIAGNOSIS — K5792 Diverticulitis of intestine, part unspecified, without perforation or abscess without bleeding: Secondary | ICD-10-CM

## 2013-07-09 DIAGNOSIS — E785 Hyperlipidemia, unspecified: Secondary | ICD-10-CM

## 2013-07-09 DIAGNOSIS — I10 Essential (primary) hypertension: Secondary | ICD-10-CM

## 2013-07-09 DIAGNOSIS — K5732 Diverticulitis of large intestine without perforation or abscess without bleeding: Secondary | ICD-10-CM

## 2013-07-09 DIAGNOSIS — K5790 Diverticulosis of intestine, part unspecified, without perforation or abscess without bleeding: Secondary | ICD-10-CM | POA: Insufficient documentation

## 2013-07-09 DIAGNOSIS — E119 Type 2 diabetes mellitus without complications: Secondary | ICD-10-CM

## 2013-07-09 DIAGNOSIS — E663 Overweight: Secondary | ICD-10-CM

## 2013-07-09 DIAGNOSIS — I2510 Atherosclerotic heart disease of native coronary artery without angina pectoris: Secondary | ICD-10-CM

## 2013-07-09 LAB — LIPID PANEL
Cholesterol: 129 mg/dL (ref 0–200)
HDL: 43 mg/dL (ref >39.00)
LDL Cholesterol: 64 mg/dL (ref 0–99)
Total CHOL/HDL Ratio: 3
Triglycerides: 111 mg/dL (ref 0.0–149.0)
VLDL: 22.2 mg/dL (ref 0.0–40.0)

## 2013-07-09 LAB — COMPREHENSIVE METABOLIC PANEL
ALT: 37 U/L (ref 0–53)
AST: 22 U/L (ref 0–37)
Albumin: 4.4 g/dL (ref 3.5–5.2)
Alkaline Phosphatase: 42 U/L (ref 39–117)
BUN: 15 mg/dL (ref 6–23)
CO2: 25 mEq/L (ref 19–32)
Calcium: 9.3 mg/dL (ref 8.4–10.5)
Chloride: 100 mEq/L (ref 96–112)
Creatinine, Ser: 0.9 mg/dL (ref 0.4–1.5)
GFR: 95.17 mL/min (ref >60.00)
Glucose, Bld: 118 mg/dL — ABNORMAL HIGH (ref 70–99)
Potassium: 4.1 mEq/L (ref 3.5–5.1)
Sodium: 135 mEq/L (ref 135–145)
Total Bilirubin: 0.8 mg/dL (ref 0.3–1.2)
Total Protein: 7.4 g/dL (ref 6.0–8.3)

## 2013-07-09 LAB — HEMOGLOBIN A1C: Hgb A1c MFr Bld: 6.4 % (ref 4.6–6.5)

## 2013-07-09 MED ORDER — TRAMADOL HCL 50 MG PO TABS: 50.0000 mg | ORAL_TABLET | Freq: Three times a day (TID) | ORAL | Status: DC | PRN

## 2013-07-09 NOTE — Progress Notes (Signed)
Subjective:    Patient ID: Eric [name], male    DOB: 03/13/1956, 57 y.o.   MRN: [id number]  HPI CC:new pt to establish  Prior saw Dr. Leavy Cella in [place].  Prior was professional wrestler.  H/o recurrent diverticulitis flares, states prior PCP kept cipro and flagyl on file at pharmacy for him.  Last flare was 6 wks ago.  Has had ER visits for diverticulitis - treated with tramadol, cipro and flagyl.  Tramadol tends to help pain better than hydrocodone.  Has had 10 different flares.  Has had colonoscopy, latest 2-3 yrs ago.  Has been suggested citrucel in past.  Requests refill of tramadol.  CAD - s/p stents x2 (2008), sees Dr. Mariah Milling HTN - stable on current regimen. HLD - stable on crestor.  No myalgias. DM - dx 11/2012.  On metformin alone 1000mg  bid.  Has lost 20 lbs.  Healthy eating, more active.  Does not check sugars.  Foot exam today.  Last eye exam 09/2012.  R lateral epicondylitis - currently with tennis elbow.  Requests stretching exercises today.  Shoulder spurs - takes hydrocodone as needed.  Sees ortho Dr. Royann Shivers.  Does not use pain meds regularly.   Lives with brother Occupation: truck Hospital doctor, stocks stores Activity: no regular exercise Diet: good water daily, fruits/vegetables some  Preventative: Thinks last CPE was 11/2012 Colonoscopy 2-3 yrs ago Flu shot 2014 Tetanus 2007  Medications and allergies reviewed and updated in chart.  Past histories reviewed and updated if relevant as below. Patient Active Problem List   Diagnosis Date Noted  . Diabetes 01/29/2013  . HYPERLIPIDEMIA-MIXED 06/15/2010  . OVERWEIGHT/OBESITY 06/15/2010  . HYPERTENSION, BENIGN 06/15/2010  . CAD, NATIVE VESSEL 06/15/2010   Past Medical History  Diagnosis Date  . Coronary artery disease 2008    2 stents   . Hyperlipidemia   . Hypertension   . Arthritis   . History of chicken pox   . Diverticulitis 2009    recurrent episodes  . Diabetes type 2, controlled    Past  Surgical History  Procedure Laterality Date  . Coronary stent placement  9/08  . Colonoscopy     History  Substance Use Topics  . Smoking status: Former Smoker -- 20 years    Types: Cigarettes    Quit date: 07/25/2006  . Smokeless tobacco: Never Used     Comment: Tobacco use-no  . Alcohol Use: Yes     Comment: occasional   Family History  Problem Relation Age of Onset  . Cancer Mother     Lung and liver  . Hypertension Father   . Hypertension Sister   . CAD Brother     CABG  . Diabetes Neg Hx   . Stroke Neg Hx    No Known Allergies Current Outpatient Prescriptions on File Prior to Visit  Medication Sig Dispense Refill  . aspirin 162 MG EC tablet Take 162 mg by mouth daily.      . clopidogrel (PLAVIX) 75 MG tablet take 1 tablet by mouth once daily  30 tablet  6  . cyclobenzaprine (FLEXERIL) 10 MG tablet Take 10 mg by mouth as needed.       . diphenhydrAMINE (SOMINEX) 25 MG tablet Take 25 mg by mouth at bedtime as needed.      Tery Sanfilippo Calcium (STOOL SOFTENER PO) Take by mouth daily.      . enalapril (VASOTEC) 20 MG tablet Take 40 mg by mouth daily.        [name]  Glucosamine 500 MG CAPS Take 3 tablets daily.      . hydrochlorothiazide (HYDRODIURIL) 25 MG tablet take 1 tablet by mouth once daily  30 tablet  5  . HYDROcodone-acetaminophen (VICODIN) 5-500 MG per tablet Take 1 tablet by mouth every 6 (six) hours as needed.      . Multiple Vitamins-Minerals (CENTRUM SILVER) tablet Take 1 tablet by mouth daily.        [name] NITROSTAT 0.4 MG SL tablet place 1 tablet under the tongue if needed every 5 minutes for chest pain for 3 doses IF NO RELIEF AFTER 3RD DOSE CALL PRESCRIBER OR 911.  25 tablet  0  . Omega-3 Fatty Acids (FISH OIL CONCENTRATE) 1000 MG CAPS Take 3,600 mg by mouth daily.       [name] omeprazole (PRILOSEC) 40 MG capsule Take 40 mg by mouth daily.        . tadalafil (CIALIS) 5 MG tablet Take 1 tablet (5 mg total) by mouth daily as needed for erectile dysfunction.  30 tablet  1    No current facility-administered medications on file prior to visit.     Review of Systems  Constitutional: Negative for fever, chills, activity change, appetite change, fatigue and unexpected weight change (weight loss , trying).  HENT: Negative for hearing loss.   Eyes: Negative for visual disturbance.  Respiratory: Negative for cough, chest tightness, shortness of breath and wheezing.   Cardiovascular: Negative for chest pain, palpitations and leg swelling.  Gastrointestinal: Positive for abdominal pain and diarrhea. Negative for nausea, vomiting, constipation, blood in stool and abdominal distention.       H/o diverticulitis flares  Genitourinary: Negative for hematuria and difficulty urinating.  Musculoskeletal: Negative for arthralgias, myalgias and neck pain.  Skin: Negative for rash.  Neurological: Negative for dizziness, seizures, syncope and headaches.  Hematological: Negative for adenopathy. Bruises/bleeds easily.  Psychiatric/Behavioral: Negative for dysphoric mood. The patient is not nervous/anxious.    Per HPI    Objective:   Physical Exam  Nursing note and vitals reviewed. Constitutional: He is oriented to person, place, and time. He appears well-developed and well-nourished. No distress.  HENT:  Head: Normocephalic and atraumatic.  Right Ear: Hearing, tympanic membrane, external ear and ear canal normal.  Left Ear: Hearing, tympanic membrane, external ear and ear canal normal.  Nose: Nose normal.  Mouth/Throat: Oropharynx is clear and moist. No oropharyngeal exudate.  Eyes: Conjunctivae and EOM are normal. Pupils are equal, round, and reactive to light. No scleral icterus.  Neck: Normal range of motion. Neck supple. No thyromegaly present.  Cardiovascular: Normal rate, regular rhythm, normal heart sounds and intact distal pulses.   No murmur heard. Pulses:      Radial pulses are 2+ on the right side, and 2+ on the left side.  Pulmonary/Chest: Effort normal and  breath sounds normal. No respiratory distress. He has no wheezes. He has no rales.  Abdominal: Soft. Bowel sounds are normal. He exhibits no distension and no mass. There is no tenderness. There is no rebound and no guarding.  Musculoskeletal: Normal range of motion. He exhibits no edema.  Lymphadenopathy:    He has no cervical adenopathy.  Neurological: He is alert and oriented to person, place, and time.  CN grossly intact, station and gait intact  Skin: Skin is warm and dry. No rash noted.  Psychiatric: He has a normal mood and affect. His behavior is normal. Judgment and thought content normal.       Assessment & Plan:

## 2013-07-09 NOTE — Assessment & Plan Note (Signed)
Check FLP today as pt fasting. 

## 2013-07-09 NOTE — Assessment & Plan Note (Signed)
Encouraged continued weight loss through healthy diet and lifestyle changes. Wt Readings from Last 3 Encounters:  07/09/13 221 lb 12 oz (100.585 kg)  01/29/13 243 lb 8 oz (110.451 kg)  01/16/12 237 lb (107.502 kg)

## 2013-07-09 NOTE — Assessment & Plan Note (Signed)
Stable, continue meds 

## 2013-07-09 NOTE — Assessment & Plan Note (Signed)
A1c improved on last check to 6.3% - recheck today.  If good control (expected given recent weight loss), will decrease metformin.

## 2013-07-09 NOTE — Assessment & Plan Note (Signed)
Tramadol refilled. Reviewed need to be seen here if persistent lower abd pain, but ok to send in abx given frequency of flares while pt comes in (in future). Has had 2 colonsocopies recently - have requested records.

## 2013-07-09 NOTE — Assessment & Plan Note (Signed)
Stable, asxs. Continue medical management.  F/u with cards.

## 2013-07-09 NOTE — Progress Notes (Signed)
Pre-visit discussion using our clinic review tool. No additional management support is needed unless otherwise documented below in the visit note.  

## 2013-07-09 NOTE — Patient Instructions (Addendum)
Blood work today. I've refilled tramadol for you. If persistent pain past 2-3 days in lower abdomen, please come in to be evaluated. We will call you with results of blood work and plan for metformin. Good to meet you today, call us with questions. Return in 3-4 months for follow up visit.

## 2013-07-12 ENCOUNTER — Encounter: Payer: Self-pay | Admitting: *Deleted

## 2013-09-09 ENCOUNTER — Other Ambulatory Visit: Payer: Self-pay | Admitting: Family Medicine

## 2013-09-09 ENCOUNTER — Other Ambulatory Visit: Payer: Self-pay | Admitting: Cardiovascular Disease

## 2013-09-09 MED ORDER — ENALAPRIL MALEATE 20 MG PO TABS: ORAL_TABLET | ORAL | Status: DC

## 2013-09-09 NOTE — Addendum Note (Signed)
Addended by: Royann Shivers A on: 09/09/2013 03:08 PM   Modules accepted: Orders

## 2013-09-22 DIAGNOSIS — H40009 Preglaucoma, unspecified, unspecified eye: Secondary | ICD-10-CM

## 2013-09-22 HISTORY — DX: Preglaucoma, unspecified, unspecified eye: H40.009

## 2013-10-06 ENCOUNTER — Other Ambulatory Visit: Payer: Self-pay | Admitting: Cardiovascular Disease

## 2013-10-07 ENCOUNTER — Other Ambulatory Visit: Payer: Self-pay | Admitting: Cardiovascular Disease

## 2013-10-08 LAB — HM DIABETES EYE EXAM

## 2013-10-17 ENCOUNTER — Encounter: Payer: Self-pay | Admitting: Family Medicine

## 2013-10-17 ENCOUNTER — Other Ambulatory Visit: Payer: Self-pay | Admitting: Family Medicine

## 2013-11-05 ENCOUNTER — Ambulatory Visit (INDEPENDENT_AMBULATORY_CARE_PROVIDER_SITE_OTHER): Payer: BC Managed Care – PPO | Admitting: Family Medicine

## 2013-11-05 ENCOUNTER — Encounter: Payer: Self-pay | Admitting: Family Medicine

## 2013-11-05 VITALS — BP 128/84 | HR 76 | Temp 97.5°F | Wt 228.0 lb

## 2013-11-05 DIAGNOSIS — I10 Essential (primary) hypertension: Secondary | ICD-10-CM

## 2013-11-05 DIAGNOSIS — E663 Overweight: Secondary | ICD-10-CM

## 2013-11-05 DIAGNOSIS — E785 Hyperlipidemia, unspecified: Secondary | ICD-10-CM

## 2013-11-05 DIAGNOSIS — E119 Type 2 diabetes mellitus without complications: Secondary | ICD-10-CM

## 2013-11-05 DIAGNOSIS — I2510 Atherosclerotic heart disease of native coronary artery without angina pectoris: Secondary | ICD-10-CM

## 2013-11-05 DIAGNOSIS — K5732 Diverticulitis of large intestine without perforation or abscess without bleeding: Secondary | ICD-10-CM

## 2013-11-05 DIAGNOSIS — K5792 Diverticulitis of intestine, part unspecified, without perforation or abscess without bleeding: Secondary | ICD-10-CM

## 2013-11-05 LAB — MICROALBUMIN / CREATININE URINE RATIO
Creatinine,U: 36.8 mg/dL
Microalb Creat Ratio: 1.1 mg/g (ref 0.0–30.0)
Microalb, Ur: 0.4 mg/dL (ref 0.0–1.9)

## 2013-11-05 LAB — HEMOGLOBIN A1C: Hgb A1c MFr Bld: 6.2 % (ref 4.6–6.5)

## 2013-11-05 NOTE — Assessment & Plan Note (Signed)
Body mass index is 34.16 kg/(m^2).  Continued discussion on healthy changes in diet and increased activity for weight loss

## 2013-11-05 NOTE — Assessment & Plan Note (Signed)
Chronic, stable. Continue meds. 

## 2013-11-05 NOTE — Assessment & Plan Note (Signed)
Complaint with aspirin and plavix.

## 2013-11-05 NOTE — Progress Notes (Signed)
Pre visit review using our clinic review tool, if applicable. No additional management support is needed unless otherwise documented below in the visit note. 

## 2013-11-05 NOTE — Progress Notes (Signed)
BP 128/84  Pulse 76  Temp(Src) 97.5 F (36.4 C) (Oral)  Wt 228 lb (103.42 kg)   CC: 4 mo f/u  Subjective:    Patient ID: Eric [name], male    DOB: May 14, 1956, 58 y.o.   MRN: [id number]  HPI: Eric [name] is a 58 y.o. male presenting on 11/05/2013 for Follow-up   CAD - s/p stents x2 (2008), sees Dr. Rockey Situ. Compliant with aspirin and plavix daily. HTN - stable on current regimen of enalapril 40mg  daily and hctz 25mg  daily.  No HA, vision changes, CP/tightness, SOB, leg swelling. HLD - stable on crestor 40mg . No myalgias.  DM - dx 11/2012. On metformin alone 1000mg  bid. Healthy eating, more active. Does not check sugars. Last eye exam 09/2013.  No paresthesias. Lab Results  Component Value Date   HGBA1C 6.4 07/09/2013   Working on low carb diet.  Weight coming down some. Wt Readings from Last 3 Encounters:  11/05/13 228 lb (103.42 kg)  07/09/13 221 lb 12 oz (100.585 kg)  01/29/13 243 lb 8 oz (110.451 kg)  Body mass index is 34.16 kg/(m^2).  Preventative: Unsure last CPE.  Does get DOT physical Q2 years. Colon cancer screening 2012 [name])  Relevant past medical, surgical, family and social history reviewed and updated as indicated.  Allergies and medications reviewed and updated. Current Outpatient Prescriptions on File Prior to Visit  Medication Sig  . aspirin 162 MG EC tablet Take 162 mg by mouth daily.  . clopidogrel (PLAVIX) 75 MG tablet take 1 tablet by mouth once daily  . cyclobenzaprine (FLEXERIL) 10 MG tablet Take 10 mg by mouth as needed.   . diphenhydrAMINE (SOMINEX) 25 MG tablet Take 25 mg by mouth at bedtime as needed.  [name] Calcium (STOOL SOFTENER PO) Take by mouth daily.  . enalapril (VASOTEC) 20 MG tablet take 2 tablets by mouth once daily  . Glucosamine 500 MG CAPS Take 3 tablets daily.  . hydrochlorothiazide (HYDRODIURIL) 25 MG tablet take 1 tablet by mouth once daily  . HYDROcodone-acetaminophen (VICODIN) 5-500 MG per tablet Take 1  tablet by mouth every 6 (six) hours as needed.  . Multiple Vitamins-Minerals (CENTRUM SILVER) tablet Take 1 tablet by mouth daily.    [name] NITROSTAT 0.4 MG SL tablet place 1 tablet under the tongue if needed every 5 minutes for chest pain for 3 doses IF NO RELIEF AFTER 3RD DOSE CALL PRESCRIBER OR 911.  . Omega-3 Fatty Acids (FISH OIL CONCENTRATE) 1000 MG CAPS Take 3,600 mg by mouth daily.   [name] omeprazole (PRILOSEC) 40 MG capsule take 1 capsule by mouth once daily  . rosuvastatin (CRESTOR) 40 MG tablet take 1 tablet by mouth once nightly  . traMADol (ULTRAM) 50 MG tablet Take 1 tablet (50 mg total) by mouth every 8 (eight) hours as needed.   No current facility-administered medications on file prior to visit.    Review of Systems Per HPI unless specifically indicated above    Objective:    BP 128/84  Pulse 76  Temp(Src) 97.5 F (36.4 C) (Oral)  Wt 228 lb (103.42 kg)  Physical Exam  Nursing note and vitals reviewed. Constitutional: He appears well-developed and well-nourished. No distress.  HENT:  Head: Normocephalic and atraumatic.  [name] Ear: External ear normal.  Left Ear: External ear normal.  Nose: Nose normal.  Mouth/Throat: Oropharynx is clear and moist. No oropharyngeal exudate.  Eyes: Conjunctivae and EOM are normal. Pupils are equal, round, and reactive to light. No scleral  icterus.  Neck: Normal range of motion. Neck supple.  Cardiovascular: Normal rate, regular rhythm, normal heart sounds and intact distal pulses.   No murmur heard. Pulmonary/Chest: Effort normal and breath sounds normal. No respiratory distress. He has no wheezes. He has no rales.  Musculoskeletal: He exhibits no edema.  Diabetic foot exam: Normal inspection No skin breakdown No calluses  Normal DP/PT pulses Normal sensation to light touch and monofilament Nails normal  Lymphadenopathy:    He has no cervical adenopathy.  Skin: Skin is warm and dry. No rash noted.  Psychiatric: He has a normal mood  and affect.   Results for orders placed in visit on 10/17/13  HM DIABETES EYE EXAM      Result Value Ref Range   HM Diabetic Eye Exam No Retinopathy  No Retinopathy      Assessment & Plan:  rtc 4-5 mo for CPE.  Problem List Items Addressed This Visit   HYPERLIPIDEMIA-MIXED      Tolerating strong dose of crestor with LDL at goal. Continue med. Lab Results  Component Value Date   LDLCALC 64 07/09/2013      OVERWEIGHT/OBESITY     Body mass index is 34.16 kg/(m^2).  Continued discussion on healthy changes in diet and increased activity for weight loss    HYPERTENSION, BENIGN     Chronic, stable. Continue meds.    CAD, NATIVE VESSEL     Complaint with aspirin and plavix.    Diabetes type 2, controlled - Primary     Chronic, stable on recent checks.   Recheck A1c today per patient request as well as urine microalb/cr ratio.  Anticipate good control. Foot exam today.  UTD eye exam. Pt's goal is for enough weight loss to be able to get off metformin.    Relevant Medications      metFORMIN (GLUCOPHAGE) 500 MG tablet   Other Relevant Orders      Hemoglobin A1c      Microalbumin / creatinine urine ratio      HM DIABETES FOOT EXAM (Completed)   Diverticulitis     Discussed high fiber diet for prevention.        Follow up plan: Return in about 4 months (around 03/07/2014), or as needed, for annual exam, prior fasting for blood work.

## 2013-11-05 NOTE — Assessment & Plan Note (Signed)
Discussed high fiber diet for prevention.

## 2013-11-05 NOTE — Assessment & Plan Note (Signed)
Chronic, stable on recent checks.   Recheck A1c today per patient request as well as urine microalb/cr ratio.  Anticipate good control. Foot exam today.  UTD eye exam. Pt's goal is for enough weight loss to be able to get off metformin.

## 2013-11-05 NOTE — Patient Instructions (Addendum)
Sign release of records for colonoscopy report 2012 from Dr. [name] Continue to work towards healthy diet and lifestyle changes for weight loss - increase walking daily. Blood work today, urine check today. Return in 4-5 months for annual exam. Good to see you today, call us with questions.

## 2013-11-05 NOTE — Assessment & Plan Note (Signed)
Tolerating strong dose of crestor with LDL at goal. Continue med. Lab Results  Component Value Date   LDLCALC 64 07/09/2013

## 2013-11-06 ENCOUNTER — Encounter: Payer: Self-pay | Admitting: *Deleted

## 2013-11-06 ENCOUNTER — Other Ambulatory Visit: Payer: Self-pay | Admitting: *Deleted

## 2013-11-06 ENCOUNTER — Other Ambulatory Visit: Payer: Self-pay | Admitting: Family Medicine

## 2013-11-06 ENCOUNTER — Telehealth: Payer: Self-pay | Admitting: Family Medicine

## 2013-11-06 NOTE — Telephone Encounter (Signed)
Relevant patient education mailed to patient.  

## 2013-11-19 ENCOUNTER — Telehealth: Payer: Self-pay

## 2013-11-19 NOTE — Telephone Encounter (Signed)
Relevant patient education mailed to patient.  

## 2013-11-23 ENCOUNTER — Encounter: Payer: Self-pay | Admitting: Family Medicine

## 2013-12-12 ENCOUNTER — Encounter: Payer: Self-pay | Admitting: Family Medicine

## 2014-01-06 ENCOUNTER — Other Ambulatory Visit: Payer: Self-pay | Admitting: Cardiovascular Disease

## 2014-02-04 ENCOUNTER — Encounter: Payer: Self-pay | Admitting: Cardiovascular Disease

## 2014-02-04 ENCOUNTER — Ambulatory Visit (INDEPENDENT_AMBULATORY_CARE_PROVIDER_SITE_OTHER): Payer: BC Managed Care – PPO | Admitting: Cardiovascular Disease

## 2014-02-04 VITALS — BP 124/82 | HR 72 | Ht 68.0 in | Wt 235.0 lb

## 2014-02-04 DIAGNOSIS — E119 Type 2 diabetes mellitus without complications: Secondary | ICD-10-CM

## 2014-02-04 DIAGNOSIS — I2510 Atherosclerotic heart disease of native coronary artery without angina pectoris: Secondary | ICD-10-CM

## 2014-02-04 DIAGNOSIS — I10 Essential (primary) hypertension: Secondary | ICD-10-CM

## 2014-02-04 DIAGNOSIS — E785 Hyperlipidemia, unspecified: Secondary | ICD-10-CM

## 2014-02-04 MED ORDER — SILDENAFIL CITRATE 100 MG PO TABS: 100.0000 mg | ORAL_TABLET | Freq: Every day | ORAL | Status: DC | PRN

## 2014-02-04 NOTE — Assessment & Plan Note (Signed)
Currently with no symptoms of angina. No further workup at this time. Continue current medication regimen. 

## 2014-02-04 NOTE — Assessment & Plan Note (Signed)
We have encouraged continued exercise, careful diet management in an effort to lose weight. Hemoglobin A1c relatively well controlled

## 2014-02-04 NOTE — Assessment & Plan Note (Signed)
Cholesterol is at goal on the current lipid regimen. No changes to the medications were made.  

## 2014-02-04 NOTE — Assessment & Plan Note (Signed)
Blood pressure is well controlled on today's visit. No changes made to the medications. 

## 2014-02-04 NOTE — Patient Instructions (Signed)
You are doing well. No medication changes were made.  Please call us if you have new issues that need to be addressed before your next appt.  Your physician wants you to follow-up in: 12 months.  You will receive a reminder letter in the mail two months in advance. If you don't receive a letter, please call our office to schedule the follow-up appointment. 

## 2014-02-04 NOTE — Progress Notes (Signed)
Patient ID: Eric [name], male    DOB: Nov 30, 1955, 58 y.o.   MRN: [id number]  HPI Comments: Eric [name] is 58 year old gentleman with coronary artery disease, stent placed in his proximal to mid RCA in September 2008, remote history of smoking stopped in 08, episodes of chest pain in 2011 with negative stress test at that time who presents for routine followup. Recent diagnosis of diabetes, hemoglobin A1c 7.4. Problem with his weight.   He continues to work as a Administrator, has difficult work shifts at all times.  He reports that his diet was doing much better and he lost weight. Since then he has gained some of that weight back. He is down from 244 pounds down to 235 pounds on today's visit.  Denies any significant chest pain or shortness of breath with exertion.  Blood pressure has been well-controlled.  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   Stress test was last in May 2011 showing no significant. Prior stress test was September 2008   Normal sinus rhythm with rate 72 beats per minute, nonspecific T wave abnormality in V5 and V6, 3 and aVF      Outpatient Encounter Prescriptions as of 02/04/2014  Medication Sig  . aspirin 162 MG EC tablet Take 162 mg by mouth daily.  . clopidogrel (PLAVIX) 75 MG tablet take 1 tablet by mouth once daily  . cyclobenzaprine (FLEXERIL) 10 MG tablet Take 10 mg by mouth as needed.   . diphenhydrAMINE (SOMINEX) 25 MG tablet Take 25 mg by mouth at bedtime as needed.  [name] Calcium (STOOL SOFTENER PO) Take by mouth daily.  . enalapril (VASOTEC) 20 MG tablet take 2 tablets by mouth once daily  . Glucosamine 500 MG CAPS Take 3 tablets daily.  . hydrochlorothiazide (HYDRODIURIL) 25 MG tablet take 1 tablet by mouth once daily  . HYDROcodone-acetaminophen (NORCO/VICODIN) 5-325 MG per tablet Take 1 tablet by mouth every 6 (six) hours as  needed for moderate pain.  . metFORMIN (GLUCOPHAGE) 500 MG tablet take 1000 mg every morning  . Multiple Vitamins-Minerals (CENTRUM SILVER) tablet Take 1 tablet by mouth daily.    [name] NITROSTAT 0.4 MG SL tablet place 1 tablet under the tongue if needed every 5 minutes for chest pain for 3 doses IF NO RELIEF AFTER 3RD DOSE CALL PRESCRIBER OR 911.  . Omega-3 Fatty Acids (FISH OIL CONCENTRATE) 1000 MG CAPS Take 1,200 mg by mouth daily.   [name] omeprazole (PRILOSEC) 40 MG capsule take 1 capsule by mouth once daily  . rosuvastatin (CRESTOR) 40 MG tablet take 1 tablet by mouth once nightly  . traMADol (ULTRAM) 50 MG tablet Take 1 tablet (50 mg total) by mouth every 8 (eight) hours as needed.    Review of Systems  Constitutional: Negative.   HENT: Negative.   Eyes: Negative.   Respiratory: Negative.   Cardiovascular: Negative.   Gastrointestinal: Negative.   Endocrine: Negative.   Musculoskeletal: Negative.   Skin: Negative.   Allergic/Immunologic: Negative.   Neurological: Negative.   Hematological: Negative.   Psychiatric/Behavioral: Negative.   All other systems reviewed and are negative.   BP 124/82  Pulse 72  Ht 5\' 8"  (1.727 m)  Wt 235 lb (106.595 kg)  BMI 35.74 kg/m2  Physical Exam  Nursing note and vitals reviewed. Constitutional: He is oriented to person, place, and time. He appears well-developed and well-nourished.  HENT:  Head: Normocephalic.  Nose: Nose normal.  Mouth/Throat: Oropharynx is clear and moist.  Eyes: Conjunctivae are normal. Pupils are equal, round, and reactive to light.  Neck: Normal range of motion. Neck supple. No JVD present.  Cardiovascular: Normal rate, regular rhythm, S1 normal, S2 normal, normal heart sounds and intact distal pulses.  Exam reveals no gallop and no friction rub.   No murmur heard. Pulmonary/Chest: Effort normal and breath sounds normal. No respiratory distress. He has no wheezes. He has no rales. He exhibits no tenderness.  Abdominal:  Soft. Bowel sounds are normal. He exhibits no distension. There is no tenderness.  Musculoskeletal: Normal range of motion. He exhibits no edema and no tenderness.  Lymphadenopathy:    He has no cervical adenopathy.  Neurological: He is alert and oriented to person, place, and time. Coordination normal.  Skin: Skin is warm and dry. No rash noted. No erythema.  Psychiatric: He has a normal mood and affect. His behavior is normal. Judgment and thought content normal.      Assessment and Plan

## 2014-02-08 ENCOUNTER — Other Ambulatory Visit: Payer: Self-pay | Admitting: Family Medicine

## 2014-03-01 ENCOUNTER — Other Ambulatory Visit: Payer: Self-pay | Admitting: Family Medicine

## 2014-03-01 DIAGNOSIS — E785 Hyperlipidemia, unspecified: Secondary | ICD-10-CM

## 2014-03-01 DIAGNOSIS — I10 Essential (primary) hypertension: Secondary | ICD-10-CM

## 2014-03-01 DIAGNOSIS — E119 Type 2 diabetes mellitus without complications: Secondary | ICD-10-CM

## 2014-03-01 DIAGNOSIS — Z125 Encounter for screening for malignant neoplasm of prostate: Secondary | ICD-10-CM

## 2014-03-04 ENCOUNTER — Other Ambulatory Visit (INDEPENDENT_AMBULATORY_CARE_PROVIDER_SITE_OTHER): Payer: BC Managed Care – PPO

## 2014-03-04 DIAGNOSIS — I10 Essential (primary) hypertension: Secondary | ICD-10-CM

## 2014-03-04 DIAGNOSIS — E119 Type 2 diabetes mellitus without complications: Secondary | ICD-10-CM

## 2014-03-04 DIAGNOSIS — E785 Hyperlipidemia, unspecified: Secondary | ICD-10-CM

## 2014-03-04 DIAGNOSIS — Z125 Encounter for screening for malignant neoplasm of prostate: Secondary | ICD-10-CM

## 2014-03-04 LAB — BASIC METABOLIC PANEL
BUN: 14 mg/dL (ref 6–23)
CO2: 26 mEq/L (ref 19–32)
Calcium: 9.6 mg/dL (ref 8.4–10.5)
Chloride: 100 mEq/L (ref 96–112)
Creatinine, Ser: 0.9 mg/dL (ref 0.4–1.5)
GFR: 89.08 mL/min (ref >60.00)
Glucose, Bld: 120 mg/dL — ABNORMAL HIGH (ref 70–99)
Potassium: 4.7 mEq/L (ref 3.5–5.1)
Sodium: 135 mEq/L (ref 135–145)

## 2014-03-04 LAB — LIPID PANEL
Cholesterol: 122 mg/dL (ref 0–200)
HDL: 34.2 mg/dL — ABNORMAL LOW (ref >39.00)
LDL Cholesterol: 55 mg/dL (ref 0–99)
NonHDL: 87.8
Total CHOL/HDL Ratio: 4
Triglycerides: 165 mg/dL — ABNORMAL HIGH (ref 0.0–149.0)
VLDL: 33 mg/dL (ref 0.0–40.0)

## 2014-03-04 LAB — PSA: PSA: 0.74 ng/mL (ref 0.10–4.00)

## 2014-03-04 LAB — HEMOGLOBIN A1C: Hgb A1c MFr Bld: 6.5 % (ref 4.6–6.5)

## 2014-03-11 ENCOUNTER — Encounter (INDEPENDENT_AMBULATORY_CARE_PROVIDER_SITE_OTHER): Payer: Self-pay

## 2014-03-11 ENCOUNTER — Encounter: Payer: Self-pay | Admitting: Family Medicine

## 2014-03-11 ENCOUNTER — Ambulatory Visit (INDEPENDENT_AMBULATORY_CARE_PROVIDER_SITE_OTHER): Payer: BC Managed Care – PPO | Admitting: Family Medicine

## 2014-03-11 VITALS — BP 118/76 | HR 80 | Temp 98.0°F | Ht 68.5 in | Wt 238.2 lb

## 2014-03-11 DIAGNOSIS — I2510 Atherosclerotic heart disease of native coronary artery without angina pectoris: Secondary | ICD-10-CM

## 2014-03-11 DIAGNOSIS — E785 Hyperlipidemia, unspecified: Secondary | ICD-10-CM

## 2014-03-11 DIAGNOSIS — Z0000 Encounter for general adult medical examination without abnormal findings: Secondary | ICD-10-CM | POA: Insufficient documentation

## 2014-03-11 DIAGNOSIS — I10 Essential (primary) hypertension: Secondary | ICD-10-CM

## 2014-03-11 DIAGNOSIS — E119 Type 2 diabetes mellitus without complications: Secondary | ICD-10-CM

## 2014-03-11 DIAGNOSIS — E663 Overweight: Secondary | ICD-10-CM

## 2014-03-11 NOTE — Assessment & Plan Note (Signed)
Preventative protocols reviewed and updated unless pt declined. Discussed healthy diet and lifestyle.  

## 2014-03-11 NOTE — Assessment & Plan Note (Signed)
Chronic, trig and HDL not optimal.  Reviewed dietary and lifestyle changes to improve these cholesterol levels.

## 2014-03-11 NOTE — Assessment & Plan Note (Signed)
No anginal sxs. Continue current regimen.

## 2014-03-11 NOTE — Progress Notes (Signed)
BP 118/76  Pulse 80  Temp(Src) 98 F (36.7 C) (Oral)  Ht 5' 8.5" (1.74 m)  Wt 238 lb 4 oz (108.069 kg)  BMI 35.69 kg/m2   CC: CPE  Subjective:    Patient ID: Eric [name], male    DOB: 01/17/1956, 58 y.o.   MRN: [id number]  HPI: Eric [name] is a 58 y.o. male presenting on 03/11/2014 for Annual Exam   Wt Readings from Last 3 Encounters:  03/11/14 238 lb 4 oz (108.069 kg)  02/04/14 235 lb (106.595 kg)  11/05/13 228 lb (103.42 kg)   Body mass index is 35.69 kg/(m^2).  Preventative: COLONOSCOPY Date: 2011 mult diverticula, int hem, rpt 10 yrs Eric [name]) Prostate cancer screening - has had normal in past. Will screen today and then readdress in 2-3 years. Flu shot 03/10/2014 Pneumovax 03/10/2014 [facility]). Tetanus 2007  Lives with brother  Occupation: truck driver, stocks stores  Activity: no regular exercise  Diet: good water, fruits/vegetables some   Relevant past medical, surgical, family and social history reviewed and updated as indicated.  Allergies and medications reviewed and updated. Current Outpatient Prescriptions on File Prior to Visit  Medication Sig  . aspirin 162 MG EC tablet Take 162 mg by mouth daily.  . clopidogrel (PLAVIX) 75 MG tablet take 1 tablet by mouth once daily  . cyclobenzaprine (FLEXERIL) 10 MG tablet Take 10 mg by mouth as needed.   . diphenhydrAMINE (SOMINEX) 25 MG tablet Take 25 mg by mouth at bedtime as needed.  [name] Calcium (STOOL SOFTENER PO) Take by mouth daily.  . enalapril (VASOTEC) 20 MG tablet take 2 tablets by mouth once daily  . Glucosamine 500 MG CAPS Take 3 tablets daily.  . hydrochlorothiazide (HYDRODIURIL) 25 MG tablet take 1 tablet by mouth once daily  . HYDROcodone-acetaminophen (NORCO/VICODIN) 5-325 MG per tablet Take 1 tablet by mouth every 6 (six) hours as needed for moderate pain.  . metFORMIN (GLUCOPHAGE) 500 MG tablet take 1000 mg every morning  . Multiple Vitamins-Minerals (CENTRUM SILVER) tablet Take  1 tablet by mouth daily.    [name] NITROSTAT 0.4 MG SL tablet place 1 tablet under the tongue if needed every 5 minutes for chest pain for 3 doses IF NO RELIEF AFTER 3RD DOSE CALL PRESCRIBER OR 911.  . Omega-3 Fatty Acids (FISH OIL CONCENTRATE) 1000 MG CAPS Take 1,200 mg by mouth daily.   [name] omeprazole (PRILOSEC) 40 MG capsule take 1 capsule by mouth once daily  . rosuvastatin (CRESTOR) 40 MG tablet take 1 tablet by mouth once nightly  . sildenafil (VIAGRA) 100 MG tablet Take 1 tablet (100 mg total) by mouth daily as needed for erectile dysfunction.  . traMADol (ULTRAM) 50 MG tablet Take 1 tablet (50 mg total) by mouth every 8 (eight) hours as needed.   No current facility-administered medications on file prior to visit.    Review of Systems  Constitutional: Negative for fever, chills, activity change, appetite change, fatigue and unexpected weight change.  HENT: Negative for hearing loss.   Eyes: Negative for visual disturbance.  Respiratory: Positive for wheezing (night time). Negative for cough, chest tightness and shortness of breath.   Cardiovascular: Negative for chest pain, palpitations and leg swelling.  Gastrointestinal: Negative for nausea, vomiting, abdominal pain, diarrhea, constipation, blood in stool and abdominal distention.  Genitourinary: Negative for hematuria and difficulty urinating.  Musculoskeletal: Negative for arthralgias, myalgias and neck pain.  Skin: Negative for rash.  Neurological: Negative for dizziness, seizures, syncope and  headaches.  Hematological: Negative for adenopathy. Does not bruise/bleed easily.  Psychiatric/Behavioral: Negative for dysphoric mood. The patient is not nervous/anxious.    Per HPI unless specifically indicated above    Objective:    BP 118/76  Pulse 80  Temp(Src) 98 F (36.7 C) (Oral)  Ht 5' 8.5" (1.74 m)  Wt 238 lb 4 oz (108.069 kg)  BMI 35.69 kg/m2  Physical Exam  Nursing note and vitals reviewed. Constitutional: He is oriented  to person, place, and time. He appears well-developed and well-nourished. No distress.  HENT:  Head: Normocephalic and atraumatic.  [name] Ear: Hearing, tympanic membrane, external ear and ear canal normal.  Left Ear: Hearing, tympanic membrane, external ear and ear canal normal.  Nose: Nose normal.  Mouth/Throat: Uvula is midline, oropharynx is clear and moist and mucous membranes are normal. No oropharyngeal exudate, posterior oropharyngeal edema or posterior oropharyngeal erythema.  Eyes: Conjunctivae and EOM are normal. Pupils are equal, round, and reactive to light. No scleral icterus.  Neck: Normal range of motion. Neck supple. No thyromegaly present.  Cardiovascular: Normal rate, regular rhythm, normal heart sounds and intact distal pulses.   No murmur heard. Pulses:      Radial pulses are 2+ on the [name] side, and 2+ on the left side.  Pulmonary/Chest: Effort normal and breath sounds normal. No respiratory distress. He has no wheezes. He has no rales.  Abdominal: Soft. Bowel sounds are normal. He exhibits no distension and no mass. There is no tenderness. There is no rebound and no guarding.  Genitourinary: Rectum normal and prostate normal. Rectal exam shows no external hemorrhoid, no internal hemorrhoid, no fissure, no mass, no tenderness and anal tone normal. Prostate is not enlarged (20gm) and not tender.  Musculoskeletal: Normal range of motion. He exhibits no edema.  Lymphadenopathy:    He has no cervical adenopathy.  Neurological: He is alert and oriented to person, place, and time.  CN grossly intact, station and gait intact  Skin: Skin is warm and dry. No rash noted.  Psychiatric: He has a normal mood and affect. His behavior is normal. Judgment and thought content normal.   Results for orders placed in visit on 03/04/14  LIPID PANEL      Result Value Ref Range   Cholesterol 122  0 - 200 mg/dL   Triglycerides 165.0 (*) 0.0 - 149.0 mg/dL   HDL 34.20 (*) >39.00 mg/dL    VLDL 33.0  0.0 - 40.0 mg/dL   LDL Cholesterol 55  0 - 99 mg/dL   Total CHOL/HDL Ratio 4     NonHDL 08.65    BASIC METABOLIC PANEL      Result Value Ref Range   Sodium 135  135 - 145 mEq/L   Potassium 4.7  3.5 - 5.1 mEq/L   Chloride 100  96 - 112 mEq/L   CO2 26  19 - 32 mEq/L   Glucose, Bld 120 (*) 70 - 99 mg/dL   BUN 14  6 - 23 mg/dL   Creatinine, Ser 0.9  0.4 - 1.5 mg/dL   Calcium 9.6  8.4 - 10.5 mg/dL   GFR 89.08  >60.00 mL/min  HEMOGLOBIN A1C      Result Value Ref Range   Hemoglobin A1C 6.5  4.6 - 6.5 %  PSA      Result Value Ref Range   PSA 0.74  0.10 - 4.00 ng/mL      Assessment & Plan:   Problem List Items Addressed This Visit  HYPERLIPIDEMIA-MIXED     Chronic, trig and HDL not optimal.  Reviewed dietary and lifestyle changes to improve these cholesterol levels.    Obesity     Body mass index is 35.69 kg/(m^2). Reviewed weight gain noted. Pt motivated to make healthy changes to lifestyle for weight loss.    HYPERTENSION, BENIGN     Chronic, stable. Continue regimen.    CAD, NATIVE VESSEL     No anginal sxs. Continue current regimen.    Diabetes type 2, controlled     Chronic, stable. Continue current regimen of metformin bid.    Health maintenance examination - Primary     Preventative protocols reviewed and updated unless pt declined. Discussed healthy diet and lifestyle.         Follow up plan: Return in about 6 months (around 09/11/2014), or as needed, for diabetes follow up.

## 2014-03-11 NOTE — Assessment & Plan Note (Signed)
Chronic, stable. Continue regimen. 

## 2014-03-11 NOTE — Progress Notes (Signed)
Pre visit review using our clinic review tool, if applicable. No additional management support is needed unless otherwise documented below in the visit note. 

## 2014-03-11 NOTE — Assessment & Plan Note (Signed)
Body mass index is 35.69 kg/(m^2). Reviewed weight gain noted. Pt motivated to make healthy changes to lifestyle for weight loss.

## 2014-03-11 NOTE — Patient Instructions (Signed)
Good to see you today, call us with questions Work on cholesterol levels through healthy diet changes - Decrease added sugars, eliminate trans fats, increase fiber and limit alcohol.  All these changes together can drop triglycerides by almost 50%. Increase aerobic exercises to help raise good cholesterol (HDL) . Return as needed or in 6 months for follow up visit.

## 2014-03-11 NOTE — Assessment & Plan Note (Signed)
Chronic, stable. Continue current regimen of metformin bid.  

## 2014-04-01 ENCOUNTER — Other Ambulatory Visit: Payer: Self-pay | Admitting: Cardiovascular Disease

## 2014-04-08 LAB — HM DIABETES EYE EXAM

## 2014-04-19 ENCOUNTER — Emergency Department: Payer: Self-pay | Admitting: Emergency Medicine

## 2014-04-19 LAB — COMPREHENSIVE METABOLIC PANEL
Albumin: 3.8 g/dL (ref 3.4–5.0)
Alkaline Phosphatase: 47 U/L
Anion Gap: 6 — ABNORMAL LOW (ref 7–16)
BUN: 10 mg/dL (ref 7–18)
Bilirubin,Total: 0.4 mg/dL (ref 0.2–1.0)
Calcium, Total: 8.9 mg/dL (ref 8.5–10.1)
Chloride: 103 mmol/L (ref 98–107)
Co2: 27 mmol/L (ref 21–32)
Creatinine: 1.02 mg/dL (ref 0.60–1.30)
EGFR (African American): >60
EGFR (Non-African Amer.): >60
Glucose: 132 mg/dL — ABNORMAL HIGH (ref 65–99)
Osmolality: 273 (ref 275–301)
Potassium: 4.1 mmol/L (ref 3.5–5.1)
SGOT(AST): 41 U/L — ABNORMAL HIGH (ref 15–37)
SGPT (ALT): 67 U/L — ABNORMAL HIGH
Sodium: 136 mmol/L (ref 136–145)
Total Protein: 7.5 g/dL (ref 6.4–8.2)

## 2014-04-19 LAB — URINALYSIS, COMPLETE
Bacteria: NONE SEEN
Bilirubin,UR: NEGATIVE
Blood: NEGATIVE
Glucose,UR: NEGATIVE mg/dL (ref 0–75)
Ketone: NEGATIVE
Leukocyte Esterase: NEGATIVE
Nitrite: NEGATIVE
Ph: 6 (ref 4.5–8.0)
Protein: NEGATIVE
RBC,UR: <1 /HPF (ref 0–5)
Specific Gravity: 1.005 (ref 1.003–1.030)
Squamous Epithelial: NONE SEEN
WBC UR: NONE SEEN /HPF (ref 0–5)

## 2014-04-19 LAB — CBC WITH DIFFERENTIAL/PLATELET
Basophil #: 0.1 10*3/uL (ref 0.0–0.1)
Basophil %: 0.9 %
Eosinophil #: 0.7 10*3/uL (ref 0.0–0.7)
Eosinophil %: 6.6 %
HCT: 49.3 % (ref 40.0–52.0)
HGB: 15.8 g/dL (ref 13.0–18.0)
Lymphocyte #: 2 10*3/uL (ref 1.0–3.6)
Lymphocyte %: 19.1 %
MCH: 29.1 pg (ref 26.0–34.0)
MCHC: 32.2 g/dL (ref 32.0–36.0)
MCV: 91 fL (ref 80–100)
Monocyte #: 1 x10 3/mm (ref 0.2–1.0)
Monocyte %: 9.5 %
Neutrophil #: 6.7 10*3/uL — ABNORMAL HIGH (ref 1.4–6.5)
Neutrophil %: 63.9 %
Platelet: 191 10*3/uL (ref 150–440)
RBC: 5.44 10*6/uL (ref 4.40–5.90)
RDW: 13.7 % (ref 11.5–14.5)
WBC: 10.5 10*3/uL (ref 3.8–10.6)

## 2014-04-19 LAB — LIPASE, BLOOD: Lipase: 132 U/L (ref 73–393)

## 2014-04-20 ENCOUNTER — Encounter: Payer: Self-pay | Admitting: Family Medicine

## 2014-04-29 ENCOUNTER — Ambulatory Visit (INDEPENDENT_AMBULATORY_CARE_PROVIDER_SITE_OTHER): Payer: BC Managed Care – PPO | Admitting: Family Medicine

## 2014-04-29 ENCOUNTER — Encounter: Payer: Self-pay | Admitting: Family Medicine

## 2014-04-29 VITALS — BP 130/80 | HR 84 | Temp 98.1°F | Wt 236.8 lb

## 2014-04-29 DIAGNOSIS — K5732 Diverticulitis of large intestine without perforation or abscess without bleeding: Secondary | ICD-10-CM

## 2014-04-29 NOTE — Progress Notes (Signed)
BP 130/80  Pulse 84  Temp(Src) 98.1 F (36.7 C) (Oral)  Wt 236 lb 12 oz (107.389 kg)   CC: ER f/u  Subjective:    Patient ID: Eric [name], male    DOB: 10-23-1955, 58 y.o.   MRN: [id number]  HPI: Eric [name] is a 58 y.o. male presenting on 04/29/2014 for Follow-up   Recently seen at [facility] ER with diverticulitis. RLQ pain - was worried about appendix. Seen on 04/19/2014. No records available.  CT scan showed diverticulitis, placed on cipro/flagyl. Today is last day of abx.  COLONOSCOPY Date: 2011 mult diverticula, int hem, rpt 10 yrs [name])  ER doc suggested surgical evaluation. H/o recurrent diverticulitis - with ascending, descending and transverse colon affected.  Lab Results  Component Value Date   HGBA1C 6.5 03/04/2014    Relevant past medical, surgical, family and social history reviewed and updated as indicated.  Allergies and medications reviewed and updated. Current Outpatient Prescriptions on File Prior to Visit  Medication Sig  . aspirin 162 MG EC tablet Take 162 mg by mouth daily.  . clopidogrel (PLAVIX) 75 MG tablet take 1 tablet by mouth once daily  . CRESTOR 40 MG tablet take 1 tablet by mouth once daily  . cyclobenzaprine (FLEXERIL) 10 MG tablet Take 10 mg by mouth as needed.   . diphenhydrAMINE (SOMINEX) 25 MG tablet Take 25 mg by mouth at bedtime as needed.  [name] Calcium (STOOL SOFTENER PO) Take by mouth daily.  . enalapril (VASOTEC) 20 MG tablet take 2 tablets by mouth once daily  . Glucosamine 500 MG CAPS Take 3 tablets daily.  . hydrochlorothiazide (HYDRODIURIL) 25 MG tablet take 1 tablet by mouth once daily  . HYDROcodone-acetaminophen (NORCO/VICODIN) 5-325 MG per tablet Take 1 tablet by mouth every 6 (six) hours as needed for moderate pain.  . metFORMIN (GLUCOPHAGE) 500 MG tablet take 1000 mg every morning  . Multiple Vitamins-Minerals (CENTRUM SILVER) tablet Take 1 tablet by mouth daily.    [name] NITROSTAT 0.4 MG SL tablet place 1  tablet under the tongue if needed every 5 minutes for chest pain for 3 doses IF NO RELIEF AFTER 3RD DOSE CALL PRESCRIBER OR 911.  . Omega-3 Fatty Acids (FISH OIL CONCENTRATE) 1000 MG CAPS Take 1,200 mg by mouth daily.   [name] omeprazole (PRILOSEC) 40 MG capsule take 1 capsule by mouth once daily  . traMADol (ULTRAM) 50 MG tablet Take 1 tablet (50 mg total) by mouth every 8 (eight) hours as needed.   No current facility-administered medications on file prior to visit.    Review of Systems Per HPI unless specifically indicated above    Objective:    BP 130/80  Pulse 84  Temp(Src) 98.1 F (36.7 C) (Oral)  Wt 236 lb 12 oz (107.389 kg)  Physical Exam  Nursing note and vitals reviewed. Constitutional: He appears well-developed and well-nourished. No distress.  HENT:  Mouth/Throat: Oropharynx is clear and moist. No oropharyngeal exudate.  Cardiovascular: Normal rate, regular rhythm, normal heart sounds and intact distal pulses.   No murmur heard. Pulmonary/Chest: Effort normal and breath sounds normal. No respiratory distress. He has no wheezes. He has no rales.  Abdominal: Soft. Normal appearance and bowel sounds are normal. He exhibits no distension and no mass. There is no hepatosplenomegaly. There is no tenderness. There is no rigidity, no rebound, no guarding, no CVA tenderness and negative Murphy's sign.  Psychiatric: He has a normal mood and affect.   Results for orders  placed in visit on 04/20/14  HM DIABETES EYE EXAM      Result Value Ref Range   HM Diabetic Eye Exam No Retinopathy  No Retinopathy      Assessment & Plan:   Problem List Items Addressed This Visit   Diverticulitis - Primary     No ER records available. Overall improving well. UTD colonoscopy. I don't think we are at stage for surgical evaluation - no recent diverticulitis, and entire colon affected in the past. Emphasized importance of preventative measures. Discussed low residue diet while active flare, then  restart high fiber diet for prevention. Provided with fiber in diet handout.        Follow up plan: Return if symptoms worsen or fail to improve.

## 2014-04-29 NOTE — Progress Notes (Signed)
Pre visit review using our clinic review tool, if applicable. No additional management support is needed unless otherwise documented below in the visit note. 

## 2014-04-29 NOTE — Assessment & Plan Note (Addendum)
No ER records available. Overall improving well. UTD colonoscopy. I don't think we are at stage for surgical evaluation - no recent diverticulitis, and entire colon affected in the past. Emphasized importance of preventative measures. Discussed low residue diet while active flare, then restart high fiber diet for prevention. Provided with fiber in diet handout.

## 2014-04-29 NOTE — Patient Instructions (Signed)
I'm glad you're getting better. Bland diet while having diverticulitis flare, then once feeling better return to high fiber diet.  Fiber in food handout provided.

## 2014-05-06 ENCOUNTER — Telehealth: Payer: Self-pay

## 2014-05-06 ENCOUNTER — Other Ambulatory Visit: Payer: Self-pay | Admitting: Cardiovascular Disease

## 2014-05-06 NOTE — Telephone Encounter (Signed)
Error

## 2014-05-06 NOTE — Telephone Encounter (Addendum)
Please review for refill on his metformin

## 2014-05-07 MED ORDER — METFORMIN HCL 500 MG PO TABS: 1500.0000 mg | ORAL_TABLET | Freq: Every day | ORAL | Status: DC

## 2014-05-07 NOTE — Telephone Encounter (Addendum)
Spoke with patient. He normally takes 3 500mg  tablets at once in the AM. Changed on med list and refilled.

## 2014-05-07 NOTE — Telephone Encounter (Signed)
plz verify what metformin dose pt is taking - our chart has 1000mg  in am but pharmacy requests 500mg  tid.

## 2014-05-07 NOTE — Addendum Note (Signed)
Addended by: Royann Shivers A on: 05/07/2014 11:39 AM   Modules accepted: Orders

## 2014-06-03 ENCOUNTER — Ambulatory Visit: Payer: Self-pay | Admitting: Unknown Physician Specialty

## 2014-06-06 ENCOUNTER — Other Ambulatory Visit: Payer: Self-pay | Admitting: Cardiovascular Disease

## 2014-07-01 ENCOUNTER — Ambulatory Visit (INDEPENDENT_AMBULATORY_CARE_PROVIDER_SITE_OTHER): Payer: BC Managed Care – PPO | Admitting: Cardiovascular Disease

## 2014-07-01 ENCOUNTER — Encounter: Payer: Self-pay | Admitting: Cardiovascular Disease

## 2014-07-01 VITALS — BP 132/78 | HR 76 | Ht 68.5 in | Wt 245.5 lb

## 2014-07-01 DIAGNOSIS — E785 Hyperlipidemia, unspecified: Secondary | ICD-10-CM

## 2014-07-01 DIAGNOSIS — E119 Type 2 diabetes mellitus without complications: Secondary | ICD-10-CM

## 2014-07-01 DIAGNOSIS — J069 Acute upper respiratory infection, unspecified: Secondary | ICD-10-CM | POA: Insufficient documentation

## 2014-07-01 DIAGNOSIS — I2510 Atherosclerotic heart disease of native coronary artery without angina pectoris: Secondary | ICD-10-CM

## 2014-07-01 DIAGNOSIS — I10 Essential (primary) hypertension: Secondary | ICD-10-CM

## 2014-07-01 MED ORDER — AZITHROMYCIN 250 MG PO TABS: ORAL_TABLET | ORAL | Status: DC

## 2014-07-01 NOTE — Assessment & Plan Note (Signed)
Currently with no symptoms of angina. No further workup at this time. Continue current medication regimen. 

## 2014-07-01 NOTE — Progress Notes (Signed)
Patient ID: Eric [name], male    DOB: 06/04/1956, 58 y.o.   MRN: [id number]  HPI Comments: Eric [name] is 58 year old gentleman with coronary artery disease, stent placed in his proximal to mid RCA in September 2008, remote history of smoking stopped in 08, episodes of chest pain in 2011 with negative stress test at that time who presents for routine followup of his coronary artery disease. Recent diagnosis of diabetes, hemoglobin A1c 7.4. Problem with his weight. He does report his episodes of diverticulitis requiring Flagyl and ciprofloxacin  In follow-up today, he reports that he is doing well. Recent upper respiratory infection with sinus congestion, significant cough with sputum. Symptoms are getting worse. He is requesting antibiotics Otherwise denies having any anginal symptoms, no chest pain, no head pressure He reports that his diabetes and cholesterol has been well-controlled Review his hemoglobin A1c typically in the 6 range Total cholesterol 122, LDL 55 He continues to work as a Administrator, has difficult work shifts at all times.  Diet is still difficult, trying to lose weight Denies any significant chest pain or shortness of breath with exertion.  Blood pressure has been well-controlled.  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   Stress test was last in May 2011 showing no significant. Prior stress test was September 2008     Outpatient Encounter Prescriptions as of 07/01/2014  Medication Sig  . aspirin 162 MG EC tablet Take 162 mg by mouth daily.  . clopidogrel (PLAVIX) 75 MG tablet take 1 tablet by mouth once daily  . CRESTOR 40 MG tablet take 1 tablet by mouth once daily  . cyclobenzaprine (FLEXERIL) 10 MG tablet Take 10 mg by mouth as needed.   .  diphenhydrAMINE (SOMINEX) 25 MG tablet Take 25 mg by mouth at bedtime as needed.  [name] Calcium (STOOL SOFTENER PO) Take by mouth daily.  . enalapril (VASOTEC) 20 MG tablet take 2 tablets by mouth once daily  . fluconazole (DIFLUCAN) 150 MG tablet Take 150 mg by mouth once a week.  . Glucosamine 500 MG CAPS Take 3 tablets daily.  . hydrochlorothiazide (HYDRODIURIL) 25 MG tablet take 1 tablet by mouth once daily  . HYDROcodone-acetaminophen (NORCO/VICODIN) 5-325 MG per tablet Take 1 tablet by mouth every 6 (six) hours as needed for moderate pain.  . metFORMIN (GLUCOPHAGE) 500 MG tablet Take 3 tablets (1,500 mg total) by mouth daily with breakfast.  . Multiple Vitamins-Minerals (CENTRUM SILVER) tablet Take 1 tablet by mouth daily.    [name] NITROSTAT 0.4 MG SL tablet place 1 tablet under the tongue if needed every 5 minutes for chest pain for 3 doses IF NO RELIEF AFTER 3RD DOSE CALL PRESCRIBER OR 911.  . Omega-3 Fatty Acids (FISH OIL CONCENTRATE) 1000 MG CAPS Take 1,200 mg by mouth daily.   [name] omeprazole (PRILOSEC) 40 MG capsule take 1 capsule by mouth once daily  . oxyCODONE-acetaminophen (PERCOCET/ROXICET) 5-325 MG per tablet Take 1 tablet by mouth every 6 (six) hours as needed for severe pain.  . traMADol (ULTRAM) 50 MG tablet Take 1 tablet (50 mg total) by mouth every 8 (eight) hours as needed.  . [DISCONTINUED] ciprofloxacin (CIPRO) 500 MG tablet Take 500 mg by mouth 2 (two) times daily.  . [DISCONTINUED] metroNIDAZOLE (FLAGYL) 500  MG tablet Take 500 mg by mouth 3 (three) times daily.   Social Hx:  reports that he quit smoking about 7 years ago. His smoking use included Cigarettes. He smoked 0.00 packs per day for 20 years. He has never used smokeless tobacco. He reports that he drinks alcohol. He reports that he does not use illicit drugs.  Review of Systems  Constitutional: Negative.   HENT: Positive for congestion.   Respiratory: Positive for cough.   Cardiovascular: Negative.    Gastrointestinal: Negative.   Endocrine: Negative.   Musculoskeletal: Negative.   Neurological: Negative.   Hematological: Negative.   Psychiatric/Behavioral: Negative.   All other systems reviewed and are negative.   BP 132/78 mmHg  Pulse 76  Ht 5' 8.5" (1.74 m)  Wt 245 lb 8 oz (111.358 kg)  BMI 36.78 kg/m2  Physical Exam  Constitutional: He is oriented to person, place, and time. He appears well-developed and well-nourished.  HENT:  Head: Normocephalic.  Nose: Nose normal.  Mouth/Throat: Oropharynx is clear and moist.  Eyes: Conjunctivae are normal. Pupils are equal, round, and reactive to light.  Neck: Normal range of motion. Neck supple. No JVD present.  Cardiovascular: Normal rate, regular rhythm, S1 normal, S2 normal, normal heart sounds and intact distal pulses.  Exam reveals no gallop and no friction rub.   No murmur heard. Pulmonary/Chest: Effort normal and breath sounds normal. No respiratory distress. He has no wheezes. He has no rales. He exhibits no tenderness.  Abdominal: Soft. Bowel sounds are normal. He exhibits no distension. There is no tenderness.  Musculoskeletal: Normal range of motion. He exhibits no edema or tenderness.  Lymphadenopathy:    He has no cervical adenopathy.  Neurological: He is alert and oriented to person, place, and time. Coordination normal.  Skin: Skin is warm and dry. No rash noted. No erythema.  Psychiatric: He has a normal mood and affect. His behavior is normal. Judgment and thought content normal.      Assessment and Plan   Nursing note and vitals reviewed.

## 2014-07-01 NOTE — Patient Instructions (Addendum)
You are doing well. No medication changes were made.  Please take Z pak 2 pills today then one pill daily  Please call us if you have new issues that need to be addressed before your next appt.  Your physician wants you to follow-up in: 6 months.  You will receive a reminder letter in the mail two months in advance. If you don't receive a letter, please call our office to schedule the follow-up appointment.

## 2014-07-01 NOTE — Assessment & Plan Note (Signed)
We have encouraged continued exercise, careful diet management in an effort to lose weight. 

## 2014-07-01 NOTE — Assessment & Plan Note (Signed)
Blood pressure is well controlled on today's visit. No changes made to the medications. 

## 2014-07-01 NOTE — Assessment & Plan Note (Signed)
He is requesting Z-Pak for worsening symptoms of sinus congestion, thick cough. Most likely viral though he reports having previous symptoms in the past responding well to Z-Pak. Z-Pak provided to him and requested that he call us if symptoms do not improve

## 2014-07-01 NOTE — Assessment & Plan Note (Signed)
Cholesterol is at goal on the current lipid regimen. No changes to the medications were made.  

## 2014-07-02 ENCOUNTER — Telehealth: Payer: Self-pay | Admitting: *Deleted

## 2014-07-02 NOTE — Telephone Encounter (Signed)
Would hold Plavix 5 days Continue on low-dose aspirin Restart Plavix after one or 2 days recovery

## 2014-07-02 NOTE — Telephone Encounter (Signed)
Patient called regarding a procedure he is having on Tues. 07/08/14 for removal of fatty tumor on his neck at [facility]. Needs to know how long to be off Plavix.

## 2014-07-03 NOTE — Telephone Encounter (Signed)
Left message for pt to call back  °

## 2014-07-03 NOTE — Telephone Encounter (Signed)
Spoke w/ pt's brother, as they live together and use the same voicemail. Advised him of Dr. Donivan Scull recommendation.  He verbalizes understanding and will have pt call us w/ any questions or concerns.

## 2014-08-08 ENCOUNTER — Other Ambulatory Visit: Payer: Self-pay | Admitting: Cardiovascular Disease

## 2014-09-16 ENCOUNTER — Encounter: Payer: Self-pay | Admitting: Family Medicine

## 2014-09-16 ENCOUNTER — Ambulatory Visit (INDEPENDENT_AMBULATORY_CARE_PROVIDER_SITE_OTHER): Payer: BLUE CROSS/BLUE SHIELD | Admitting: Family Medicine

## 2014-09-16 VITALS — BP 118/80 | HR 74 | Temp 97.6°F | Wt 243.0 lb

## 2014-09-16 DIAGNOSIS — E785 Hyperlipidemia, unspecified: Secondary | ICD-10-CM

## 2014-09-16 DIAGNOSIS — E119 Type 2 diabetes mellitus without complications: Secondary | ICD-10-CM

## 2014-09-16 DIAGNOSIS — I10 Essential (primary) hypertension: Secondary | ICD-10-CM

## 2014-09-16 DIAGNOSIS — I2510 Atherosclerotic heart disease of native coronary artery without angina pectoris: Secondary | ICD-10-CM

## 2014-09-16 DIAGNOSIS — E669 Obesity, unspecified: Secondary | ICD-10-CM

## 2014-09-16 LAB — HEMOGLOBIN A1C: Hgb A1c MFr Bld: 6.8 % — ABNORMAL HIGH (ref 4.6–6.5)

## 2014-09-16 LAB — BASIC METABOLIC PANEL
BUN: 10 mg/dL (ref 6–23)
CO2: 30 mEq/L (ref 19–32)
Calcium: 9.9 mg/dL (ref 8.4–10.5)
Chloride: 101 mEq/L (ref 96–112)
Creatinine, Ser: 0.93 mg/dL (ref 0.40–1.50)
GFR: 88.91 mL/min (ref >60.00)
Glucose, Bld: 114 mg/dL — ABNORMAL HIGH (ref 70–99)
Potassium: 4.7 mEq/L (ref 3.5–5.1)
Sodium: 136 mEq/L (ref 135–145)

## 2014-09-16 NOTE — Assessment & Plan Note (Signed)
Chronic, stable. Continue current regimen. 

## 2014-09-16 NOTE — Assessment & Plan Note (Addendum)
Reviewed healthy diet and lifestyle changes to affect sustainable weight loss.  

## 2014-09-16 NOTE — Progress Notes (Signed)
Pre visit review using our clinic review tool, if applicable. No additional management support is needed unless otherwise documented below in the visit note. 

## 2014-09-16 NOTE — Patient Instructions (Signed)
Return in 6 months for physical, sooner if needed. You are doing well today. Continue medicines as up to now. Blood work today. Work on weight over next 6 months.

## 2014-09-16 NOTE — Assessment & Plan Note (Signed)
Stable, asxs.

## 2014-09-16 NOTE — Progress Notes (Signed)
BP 118/80 mmHg  Pulse 74  Temp(Src) 97.6 F (36.4 C) (Oral)  Wt 243 lb (110.224 kg)   CC: 6 mo f/u visit  Subjective:    Patient ID: Eric [name], male    DOB: 1956/07/16, 59 y.o.   MRN: [id number]  HPI: Eric [name] is a 59 y.o. male presenting on 09/16/2014 for Follow-up   Started new supplement over last 1 month for joints - Vioprex (white peony, ginger, turmeric, and propolis). Taking this instead of osteo biflex  CAD - s/p stents x2 (2008), sees Dr. Rockey Situ. Compliant with aspirin and plavix daily.   HTN - stable on current regimen of enalapril 20mg  bid and hctz 25mg  daily. No HA, vision changes, CP/tightness, SOB, leg swelling.  HLD - stable on crestor 40mg . No myalgias. Also on 1cap fish oil daily.  Obesity - discussed weigh gain noted.  DM - dx 11/2012. On metformin alone 500mg  tid. Healthy eating, Does not check sugars. No low sugar sxs like dizziness or shaking/passing out. Last eye exam 03/2014. No paresthesias. Pneumovax: 02/2014 Lab Results  Component Value Date   HGBA1C 6.5 03/04/2014   Diabetic Foot Exam - Simple   Simple Foot Form  Diabetic Foot exam was performed with the following findings:  Yes 09/16/2014 11:34 AM  Visual Inspection  No deformities, no ulcerations, no other skin breakdown bilaterally:  Yes  Sensation Testing  Intact to touch and monofilament testing bilaterally:  Yes  Pulse Check  Posterior Tibialis and Dorsalis pulse intact bilaterally:  Yes  Comments       Relevant past medical, surgical, family and social history reviewed and updated as indicated. Interim medical history since our last visit reviewed. Allergies and medications reviewed and updated. Current Outpatient Prescriptions on File Prior to Visit  Medication Sig  . aspirin 162 MG EC tablet Take 162 mg by mouth daily.  . clopidogrel (PLAVIX) 75 MG tablet take 1 tablet by mouth once daily  . CRESTOR 40 MG tablet take 1 tablet by mouth once daily  . cyclobenzaprine  (FLEXERIL) 10 MG tablet Take 10 mg by mouth as needed.   . diphenhydrAMINE (SOMINEX) 25 MG tablet Take 25 mg by mouth at bedtime as needed.  [name] Calcium (STOOL SOFTENER PO) Take by mouth daily.  . enalapril (VASOTEC) 20 MG tablet take 2 tablets by mouth once daily  . hydrochlorothiazide (HYDRODIURIL) 25 MG tablet take 1 tablet by mouth once daily  . HYDROcodone-acetaminophen (NORCO/VICODIN) 5-325 MG per tablet Take 1 tablet by mouth every 6 (six) hours as needed for moderate pain.  . metFORMIN (GLUCOPHAGE) 500 MG tablet Take 3 tablets (1,500 mg total) by mouth daily with breakfast.  . Multiple Vitamins-Minerals (CENTRUM SILVER) tablet Take 1 tablet by mouth daily.    [name] NITROSTAT 0.4 MG SL tablet place 1 tablet under the tongue if needed every 5 minutes for chest pain for 3 doses IF NO RELIEF AFTER 3RD DOSE CALL PRESCRIBER OR 911.  . Omega-3 Fatty Acids (FISH OIL CONCENTRATE) 1000 MG CAPS Take 1,200 mg by mouth daily.   [name] omeprazole (PRILOSEC) 40 MG capsule take 1 capsule by mouth once daily  . oxyCODONE-acetaminophen (PERCOCET/ROXICET) 5-325 MG per tablet Take 1 tablet by mouth every 6 (six) hours as needed for severe pain.  . traMADol (ULTRAM) 50 MG tablet Take 1 tablet (50 mg total) by mouth every 8 (eight) hours as needed. (Patient not taking: Reported on 09/16/2014)   No current facility-administered medications on file prior to  visit.    Review of Systems Per HPI unless specifically indicated above     Objective:    BP 118/80 mmHg  Pulse 74  Temp(Src) 97.6 F (36.4 C) (Oral)  Wt 243 lb (110.224 kg)  Wt Readings from Last 3 Encounters:  09/16/14 243 lb (110.224 kg)  07/01/14 245 lb 8 oz (111.358 kg)  04/29/14 236 lb 12 oz (107.389 kg)    Physical Exam  Constitutional: He appears well-developed and well-nourished. No distress.  HENT:  Head: Normocephalic and atraumatic.  Right Ear: External ear normal.  Left Ear: External ear normal.  Nose: Nose normal.  Mouth/Throat:  Oropharynx is clear and moist. No oropharyngeal exudate.  Eyes: Conjunctivae and EOM are normal. Pupils are equal, round, and reactive to light. No scleral icterus.  Neck: Normal range of motion. Neck supple.  Cardiovascular: Normal rate, regular rhythm, normal heart sounds and intact distal pulses.   No murmur heard. Pulmonary/Chest: Effort normal and breath sounds normal. No respiratory distress. He has no wheezes. He has no rales.  Musculoskeletal: He exhibits no edema.  See HPI for foot exam if done  Lymphadenopathy:    He has no cervical adenopathy.  Skin: Skin is warm and dry. No rash noted.  Psychiatric: He has a normal mood and affect.  Nursing note and vitals reviewed.  Results for orders placed or performed in visit on 04/20/14  HM DIABETES EYE EXAM  Result Value Ref Range   HM Diabetic Eye Exam No Retinopathy No Retinopathy      Assessment & Plan:   Problem List Items Addressed This Visit    Obesity    Reviewed healthy diet and lifestyle changes to affect sustainable weight loss.      HYPERTENSION, BENIGN    Chronic, stable. Continue current regimen.      Hyperlipidemia    Chronic, stable. Continue statin and fish oil.      Diabetes type 2, controlled - Primary    Chronic, stable. Continue regimen. Check A1c today.      Relevant Orders   Hemoglobin M4B   Basic metabolic panel   CAD, NATIVE VESSEL    Stable, asxs.          Follow up plan: Return in about 6 months (around 03/17/2015), or as needed, for annual exam, prior fasting for blood work.

## 2014-09-16 NOTE — Assessment & Plan Note (Signed)
Chronic, stable. Continue statin and fish oil.

## 2014-09-16 NOTE — Assessment & Plan Note (Signed)
Chronic, stable. Continue regimen. Check A1c today.

## 2014-09-18 ENCOUNTER — Telehealth: Payer: Self-pay

## 2014-09-18 NOTE — Telephone Encounter (Signed)
Patient aware of lab results and directions.

## 2014-09-18 NOTE — Telephone Encounter (Signed)
-----   Message from Ria Bush, MD sent at 09/18/2014  9:43 AM EST ----- plz notify A1c stable at 6.8% - continue current regimen. Kidneys normal.

## 2014-10-06 ENCOUNTER — Other Ambulatory Visit: Payer: Self-pay | Admitting: Cardiovascular Disease

## 2014-10-14 ENCOUNTER — Other Ambulatory Visit: Payer: Self-pay | Admitting: Cardiovascular Disease

## 2014-10-14 NOTE — Telephone Encounter (Signed)
Refill sent for omeprazole.  

## 2014-11-03 ENCOUNTER — Other Ambulatory Visit: Payer: Self-pay | Admitting: Cardiovascular Disease

## 2014-12-05 ENCOUNTER — Other Ambulatory Visit: Payer: Self-pay

## 2014-12-05 ENCOUNTER — Other Ambulatory Visit: Payer: Self-pay | Admitting: Cardiovascular Disease

## 2014-12-05 MED ORDER — CLOPIDOGREL BISULFATE 75 MG PO TABS: 75.0000 mg | ORAL_TABLET | Freq: Every day | ORAL | Status: DC

## 2014-12-05 NOTE — Telephone Encounter (Signed)
Refill plavix 75.

## 2014-12-09 ENCOUNTER — Other Ambulatory Visit: Payer: Self-pay | Admitting: Cardiovascular Disease

## 2014-12-11 ENCOUNTER — Other Ambulatory Visit: Payer: Self-pay | Admitting: Family Medicine

## 2014-12-11 MED ORDER — METFORMIN HCL 500 MG PO TABS: 1500.0000 mg | ORAL_TABLET | Freq: Every day | ORAL | Status: DC

## 2014-12-11 NOTE — Addendum Note (Signed)
Addended by: Royann Shivers A on: 12/11/2014 03:57 PM   Modules accepted: Orders

## 2014-12-31 ENCOUNTER — Other Ambulatory Visit: Payer: Self-pay | Admitting: *Deleted

## 2014-12-31 MED ORDER — NITROGLYCERIN 0.4 MG SL SUBL: SUBLINGUAL_TABLET | SUBLINGUAL | Status: DC

## 2015-01-06 ENCOUNTER — Ambulatory Visit (INDEPENDENT_AMBULATORY_CARE_PROVIDER_SITE_OTHER): Payer: BLUE CROSS/BLUE SHIELD | Admitting: Cardiovascular Disease

## 2015-01-06 ENCOUNTER — Encounter: Payer: Self-pay | Admitting: Cardiovascular Disease

## 2015-01-06 VITALS — BP 122/72 | HR 71 | Ht 69.0 in | Wt 244.5 lb

## 2015-01-06 DIAGNOSIS — E119 Type 2 diabetes mellitus without complications: Secondary | ICD-10-CM | POA: Diagnosis not present

## 2015-01-06 DIAGNOSIS — E785 Hyperlipidemia, unspecified: Secondary | ICD-10-CM | POA: Diagnosis not present

## 2015-01-06 DIAGNOSIS — I2510 Atherosclerotic heart disease of native coronary artery without angina pectoris: Secondary | ICD-10-CM | POA: Diagnosis not present

## 2015-01-06 DIAGNOSIS — I10 Essential (primary) hypertension: Secondary | ICD-10-CM | POA: Diagnosis not present

## 2015-01-06 MED ORDER — ENALAPRIL MALEATE 20 MG PO TABS: 40.0000 mg | ORAL_TABLET | Freq: Every day | ORAL | Status: DC

## 2015-01-06 MED ORDER — CLOPIDOGREL BISULFATE 75 MG PO TABS: 75.0000 mg | ORAL_TABLET | Freq: Every day | ORAL | Status: DC

## 2015-01-06 MED ORDER — SILDENAFIL CITRATE 20 MG PO TABS: 20.0000 mg | ORAL_TABLET | Freq: Three times a day (TID) | ORAL | Status: DC

## 2015-01-06 MED ORDER — HYDROCHLOROTHIAZIDE 25 MG PO TABS: 25.0000 mg | ORAL_TABLET | Freq: Every day | ORAL | Status: DC

## 2015-01-06 MED ORDER — TRAMADOL HCL 50 MG PO TABS: 50.0000 mg | ORAL_TABLET | Freq: Three times a day (TID) | ORAL | Status: DC | PRN

## 2015-01-06 MED ORDER — NITROGLYCERIN 0.4 MG SL SUBL: SUBLINGUAL_TABLET | SUBLINGUAL | Status: DC

## 2015-01-06 MED ORDER — ROSUVASTATIN CALCIUM 40 MG PO TABS: 40.0000 mg | ORAL_TABLET | Freq: Every day | ORAL | Status: DC

## 2015-01-06 NOTE — Patient Instructions (Signed)
You are doing well. No medication changes were made.  Please call us if you have new issues that need to be addressed before your next appt.  Your physician wants you to follow-up in: 12 months.  You will receive a reminder letter in the mail two months in advance. If you don't receive a letter, please call our office to schedule the follow-up appointment. 
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
## 2015-01-06 NOTE — Assessment & Plan Note (Signed)
Recent hemoglobin A1c 6.8 We have encouraged continued exercise, careful diet management in an effort to lose weight.

## 2015-01-06 NOTE — Assessment & Plan Note (Signed)
Cholesterol is at goal on the current lipid regimen. No changes to the medications were made.  

## 2015-01-06 NOTE — Assessment & Plan Note (Signed)
Currently with no symptoms of angina. No further workup at this time. Continue current medication regimen. 

## 2015-01-06 NOTE — Assessment & Plan Note (Signed)
Blood pressure is well controlled on today's visit. No changes made to the medications. 

## 2015-02-02 ENCOUNTER — Other Ambulatory Visit: Payer: Self-pay

## 2015-02-02 MED ORDER — OMEPRAZOLE 40 MG PO CPDR: 40.0000 mg | DELAYED_RELEASE_CAPSULE | Freq: Every day | ORAL | Status: DC

## 2015-02-02 NOTE — Telephone Encounter (Signed)
Refill sent for omeprazole.  

## 2015-03-08 ENCOUNTER — Other Ambulatory Visit: Payer: Self-pay | Admitting: Family Medicine

## 2015-03-08 DIAGNOSIS — Z125 Encounter for screening for malignant neoplasm of prostate: Secondary | ICD-10-CM

## 2015-03-08 DIAGNOSIS — E785 Hyperlipidemia, unspecified: Secondary | ICD-10-CM

## 2015-03-08 DIAGNOSIS — I10 Essential (primary) hypertension: Secondary | ICD-10-CM

## 2015-03-08 DIAGNOSIS — E119 Type 2 diabetes mellitus without complications: Secondary | ICD-10-CM

## 2015-03-10 ENCOUNTER — Other Ambulatory Visit (INDEPENDENT_AMBULATORY_CARE_PROVIDER_SITE_OTHER): Payer: BLUE CROSS/BLUE SHIELD

## 2015-03-10 DIAGNOSIS — E785 Hyperlipidemia, unspecified: Secondary | ICD-10-CM

## 2015-03-10 DIAGNOSIS — I10 Essential (primary) hypertension: Secondary | ICD-10-CM | POA: Diagnosis not present

## 2015-03-10 DIAGNOSIS — Z125 Encounter for screening for malignant neoplasm of prostate: Secondary | ICD-10-CM | POA: Diagnosis not present

## 2015-03-10 DIAGNOSIS — E119 Type 2 diabetes mellitus without complications: Secondary | ICD-10-CM

## 2015-03-10 LAB — LIPID PANEL
Cholesterol: 110 mg/dL (ref 0–200)
HDL: 38.9 mg/dL — ABNORMAL LOW (ref >39.00)
LDL Cholesterol: 57 mg/dL (ref 0–99)
NonHDL: 71.16
Total CHOL/HDL Ratio: 3
Triglycerides: 71 mg/dL (ref 0.0–149.0)
VLDL: 14.2 mg/dL (ref 0.0–40.0)

## 2015-03-10 LAB — PSA: PSA: 0.66 ng/mL (ref 0.10–4.00)

## 2015-03-10 LAB — COMPREHENSIVE METABOLIC PANEL
ALT: 64 U/L — ABNORMAL HIGH (ref 0–53)
AST: 35 U/L (ref 0–37)
Albumin: 4.2 g/dL (ref 3.5–5.2)
Alkaline Phosphatase: 46 U/L (ref 39–117)
BUN: 8 mg/dL (ref 6–23)
CO2: 30 mEq/L (ref 19–32)
Calcium: 9.6 mg/dL (ref 8.4–10.5)
Chloride: 101 mEq/L (ref 96–112)
Creatinine, Ser: 0.86 mg/dL (ref 0.40–1.50)
GFR: 97.15 mL/min (ref >60.00)
Glucose, Bld: 122 mg/dL — ABNORMAL HIGH (ref 70–99)
Potassium: 4.8 mEq/L (ref 3.5–5.1)
Sodium: 137 mEq/L (ref 135–145)
Total Bilirubin: 0.6 mg/dL (ref 0.2–1.2)
Total Protein: 6.8 g/dL (ref 6.0–8.3)

## 2015-03-10 LAB — MICROALBUMIN / CREATININE URINE RATIO
Creatinine,U: 37 mg/dL
Microalb Creat Ratio: 1.9 mg/g (ref 0.0–30.0)
Microalb, Ur: <0.7 mg/dL (ref 0.0–1.9)

## 2015-03-10 LAB — HEMOGLOBIN A1C: Hgb A1c MFr Bld: 6.5 % (ref 4.6–6.5)

## 2015-03-17 ENCOUNTER — Encounter: Payer: Self-pay | Admitting: Family Medicine

## 2015-03-17 ENCOUNTER — Ambulatory Visit (INDEPENDENT_AMBULATORY_CARE_PROVIDER_SITE_OTHER): Payer: BLUE CROSS/BLUE SHIELD | Admitting: Family Medicine

## 2015-03-17 VITALS — BP 146/88 | HR 77 | Temp 97.9°F | Ht 69.0 in | Wt 243.0 lb

## 2015-03-17 DIAGNOSIS — K5730 Diverticulosis of large intestine without perforation or abscess without bleeding: Secondary | ICD-10-CM

## 2015-03-17 DIAGNOSIS — IMO0001 Reserved for inherently not codable concepts without codable children: Secondary | ICD-10-CM

## 2015-03-17 DIAGNOSIS — I2510 Atherosclerotic heart disease of native coronary artery without angina pectoris: Secondary | ICD-10-CM

## 2015-03-17 DIAGNOSIS — Z0000 Encounter for general adult medical examination without abnormal findings: Secondary | ICD-10-CM | POA: Diagnosis not present

## 2015-03-17 DIAGNOSIS — I10 Essential (primary) hypertension: Secondary | ICD-10-CM

## 2015-03-17 DIAGNOSIS — E119 Type 2 diabetes mellitus without complications: Secondary | ICD-10-CM

## 2015-03-17 DIAGNOSIS — E785 Hyperlipidemia, unspecified: Secondary | ICD-10-CM

## 2015-03-17 NOTE — Assessment & Plan Note (Signed)
Discussed recommended high fiber diet

## 2015-03-17 NOTE — Assessment & Plan Note (Signed)
Chronic, elevated today, pt attributes to dietary choices last night, states normally great control at home. No changes made.

## 2015-03-17 NOTE — Assessment & Plan Note (Signed)
Discussed healthy diet and lifestyle changes to affect sustainable weight loss  

## 2015-03-17 NOTE — Assessment & Plan Note (Signed)
Asxs. Followed by cards.

## 2015-03-17 NOTE — Assessment & Plan Note (Signed)
Chronic, stable. Continue metformin.  

## 2015-03-17 NOTE — Assessment & Plan Note (Signed)
Preventative protocols reviewed and updated unless pt declined. Discussed healthy diet and lifestyle.  

## 2015-03-17 NOTE — Assessment & Plan Note (Signed)
Chronic, stable. Continue current regimen. 

## 2015-03-17 NOTE — Progress Notes (Signed)
BP 146/88 mmHg  Pulse 77  Temp(Src) 97.9 F (36.6 C) (Oral)  Ht 5\' 9"  (1.753 m)  Wt 243 lb (110.224 kg)  BMI 35.87 kg/m2  SpO2 97%   CC: CPE  Subjective:    Patient ID: Eric [name], male    DOB: 1956/05/30, 58 y.o.   MRN: [id number]  HPI: Eric [name] is a 59 y.o. male presenting on 03/17/2015 for Annual Exam   H/o recurrent diverticulitis throughout entire colon. Last flare 04/2014.   bp elevated today - attributes to poor dinner choices last night.  Preventative: COLONOSCOPY Date: 2011 mult diverticula, int hem, rpt 10 yrs [name]) Prostate cancer screening - has had normal in past. Screened 2015 and then readdress in 2-3 years.  Flu shot yearly Pneumovax 03/10/2014 [facility]) Tetanus 2007 zostavax 07/2013 Seat belt use discussed Sunscreen use discussed, denies changing moles  Lives with brother  Occupation: truck driver, stocks stores  Activity: no regular exercise  Diet: good water, fruits/vegetables some   Relevant past medical, surgical, family and social history reviewed and updated as indicated. Interim medical history since our last visit reviewed. Allergies and medications reviewed and updated. Current Outpatient Prescriptions on File Prior to Visit  Medication Sig  . aspirin 162 MG EC tablet Take 162 mg by mouth daily.  . clopidogrel (PLAVIX) 75 MG tablet Take 1 tablet (75 mg total) by mouth daily.  . cyclobenzaprine (FLEXERIL) 10 MG tablet Take 10 mg by mouth as needed.   . diphenhydrAMINE (SOMINEX) 25 MG tablet Take 25 mg by mouth at bedtime as needed.  [name] Calcium (STOOL SOFTENER PO) Take by mouth daily.  . enalapril (VASOTEC) 20 MG tablet Take 2 tablets (40 mg total) by mouth daily.  . hydrochlorothiazide (HYDRODIURIL) 25 MG tablet Take 1 tablet (25 mg total) by mouth daily.  [name] HYDROcodone-acetaminophen (NORCO/VICODIN) 5-325 MG per tablet Take 1 tablet by mouth every 6 (six) hours as needed for moderate pain.  . metFORMIN  (GLUCOPHAGE) 500 MG tablet take 3 tablet by mouth once daily with BREAKFAST  . Multiple Vitamins-Minerals (CENTRUM SILVER) tablet Take 1 tablet by mouth daily.    . nitroGLYCERIN (NITROSTAT) 0.4 MG SL tablet place 1 tablet under the tongue if needed every 5 minutes for chest pain for 3 doses IF NO RELIEF AFTER 3RD DOSE CALL PRESCRIBER OR 911.  . Omega-3 Fatty Acids (FISH OIL CONCENTRATE) 1000 MG CAPS Take three tablets daily.  [name] omeprazole (PRILOSEC) 40 MG capsule Take 1 capsule (40 mg total) by mouth daily.  [name] OVER THE COUNTER MEDICATION Take 2,800 mg by mouth daily. Vioprex (joint/ligament)  . oxyCODONE-acetaminophen (PERCOCET/ROXICET) 5-325 MG per tablet Take 1 tablet by mouth every 6 (six) hours as needed for severe pain.  . rosuvastatin (CRESTOR) 40 MG tablet Take 1 tablet (40 mg total) by mouth daily.  . traMADol (ULTRAM) 50 MG tablet Take 1 tablet (50 mg total) by mouth every 8 (eight) hours as needed.  . sildenafil (REVATIO) 20 MG tablet Take 1 tablet (20 mg total) by mouth 3 (three) times daily. (Patient not taking: Reported on 03/17/2015)   No current facility-administered medications on file prior to visit.    Review of Systems  Constitutional: Negative for fever, chills, activity change, appetite change, fatigue and unexpected weight change.  HENT: Negative for hearing loss.   Eyes: Negative for visual disturbance.  Respiratory: Negative for cough, chest tightness, shortness of breath and wheezing.   Cardiovascular: Negative for chest pain, palpitations and leg  swelling.  Gastrointestinal: Negative for nausea, vomiting, abdominal pain, diarrhea, constipation, blood in stool and abdominal distention.  Genitourinary: Negative for hematuria and difficulty urinating.  Musculoskeletal: Negative for myalgias, arthralgias and neck pain.  Skin: Negative for rash.  Neurological: Negative for dizziness, seizures, syncope and headaches.  Hematological: Negative for adenopathy. Does not  bruise/bleed easily.  Psychiatric/Behavioral: Negative for dysphoric mood. The patient is not nervous/anxious.    Per HPI unless specifically indicated above     Objective:    BP 146/88 mmHg  Pulse 77  Temp(Src) 97.9 F (36.6 C) (Oral)  Ht 5\' 9"  (1.753 m)  Wt 243 lb (110.224 kg)  BMI 35.87 kg/m2  SpO2 97%  Wt Readings from Last 3 Encounters:  03/17/15 243 lb (110.224 kg)  01/06/15 244 lb 8 oz (110.904 kg)  09/16/14 243 lb (110.224 kg)    Physical Exam  Constitutional: He is oriented to person, place, and time. He appears well-developed and well-nourished. No distress.  HENT:  Head: Normocephalic and atraumatic.  [name] Ear: Hearing, tympanic membrane, external ear and ear canal normal.  Left Ear: Hearing, tympanic membrane, external ear and ear canal normal.  Nose: Nose normal.  Mouth/Throat: Uvula is midline, oropharynx is clear and moist and mucous membranes are normal. No oropharyngeal exudate, posterior oropharyngeal edema or posterior oropharyngeal erythema.  Eyes: Conjunctivae and EOM are normal. Pupils are equal, round, and reactive to light. No scleral icterus.  Neck: Normal range of motion. Neck supple.  Cardiovascular: Normal rate, regular rhythm, normal heart sounds and intact distal pulses.   No murmur heard. Pulses:      Radial pulses are 2+ on the [name] side, and 2+ on the left side.  Pulmonary/Chest: Effort normal and breath sounds normal. No respiratory distress. He has no wheezes. He has no rales.  Abdominal: Soft. Bowel sounds are normal. He exhibits no distension and no mass. There is no tenderness. There is no rebound and no guarding.  Genitourinary:  DRE - deferred  Musculoskeletal: Normal range of motion. He exhibits no edema.  Lymphadenopathy:    He has no cervical adenopathy.  Neurological: He is alert and oriented to person, place, and time.  CN grossly intact, station and gait intact  Skin: Skin is warm and dry. No rash noted.  Psychiatric: He has  a normal mood and affect. His behavior is normal. Judgment and thought content normal.  Nursing note and vitals reviewed.  Results for orders placed or performed in visit on 03/10/15  Lipid panel  Result Value Ref Range   Cholesterol 110 0 - 200 mg/dL   Triglycerides 71.0 0.0 - 149.0 mg/dL   HDL 38.90 (L) >39.00 mg/dL   VLDL 14.2 0.0 - 40.0 mg/dL   LDL Cholesterol 57 0 - 99 mg/dL   Total CHOL/HDL Ratio 3    NonHDL 71.16   Comprehensive metabolic panel  Result Value Ref Range   Sodium 137 135 - 145 mEq/L   Potassium 4.8 3.5 - 5.1 mEq/L   Chloride 101 96 - 112 mEq/L   CO2 30 19 - 32 mEq/L   Glucose, Bld 122 (H) 70 - 99 mg/dL   BUN 8 6 - 23 mg/dL   Creatinine, Ser 0.86 0.40 - 1.50 mg/dL   Total Bilirubin 0.6 0.2 - 1.2 mg/dL   Alkaline Phosphatase 46 39 - 117 U/L   AST 35 0 - 37 U/L   ALT 64 (H) 0 - 53 U/L   Total Protein 6.8 6.0 - 8.3 g/dL  Albumin 4.2 3.5 - 5.2 g/dL   Calcium 9.6 8.4 - 10.5 mg/dL   GFR 97.15 >60.00 mL/min  Hemoglobin A1c  Result Value Ref Range   Hgb A1c MFr Bld 6.5 4.6 - 6.5 %  PSA  Result Value Ref Range   PSA 0.66 0.10 - 4.00 ng/mL  Microalbumin / creatinine urine ratio  Result Value Ref Range   Microalb, Ur <0.7 0.0 - 1.9 mg/dL   Creatinine,U 37.0 mg/dL   Microalb Creat Ratio 1.9 0.0 - 30.0 mg/g      Assessment & Plan:   Problem List Items Addressed This Visit    Hyperlipidemia    Chronic, stable. Continue current regimen.      Obesity, Class II, BMI 35-39.9, with comorbidity    Discussed healthy diet and lifestyle changes to affect sustainable weight loss.      HYPERTENSION, BENIGN    Chronic, elevated today, pt attributes to dietary choices last night, states normally great control at home. No changes made.      CAD, NATIVE VESSEL    Asxs. Followed by cards.      Diabetes type 2, controlled    Chronic, stable. Continue metformin.       Diverticulosis    Discussed recommended high fiber diet      Health maintenance examination  - Primary    Preventative protocols reviewed and updated unless pt declined. Discussed healthy diet and lifestyle.           Follow up plan: Return in about 6 months (around 09/17/2015), or as needed, for follow up visit.

## 2015-03-17 NOTE — Patient Instructions (Addendum)
Look into Coenzyme Q10 for muscle aches. labwork looking overall good. You are doing well today. Hep C screen next blood work. Return as needed or in 6 months for diabetes check.  Health Maintenance A healthy lifestyle and preventative care can promote health and wellness.  Maintain regular health, dental, and eye exams.  Eat a healthy diet. Foods like vegetables, fruits, whole grains, low-fat dairy products, and lean protein foods contain the nutrients you need and are low in calories. Decrease your intake of foods high in solid fats, added sugars, and salt. Get information about a proper diet from your health care provider, if necessary.  Regular physical exercise is one of the most important things you can do for your health. Most adults should get at least 150 minutes of moderate-intensity exercise (any activity that increases your heart rate and causes you to sweat) each week. In addition, most adults need muscle-strengthening exercises on 2 or more days a week.   Maintain a healthy weight. The body mass index (BMI) is a screening tool to identify possible weight problems. It provides an estimate of body fat based on height and weight. Your health care provider can find your BMI and can help you achieve or maintain a healthy weight. For males 20 years and older:  A BMI below 18.5 is considered underweight.  A BMI of 18.5 to 24.9 is normal.  A BMI of 25 to 29.9 is considered overweight.  A BMI of 30 and above is considered obese.  Maintain normal blood lipids and cholesterol by exercising and minimizing your intake of saturated fat. Eat a balanced diet with plenty of fruits and vegetables. Blood tests for lipids and cholesterol should begin at age 67 and be repeated every 5 years. If your lipid or cholesterol levels are high, you are over age 55, or you are at high risk for heart disease, you may need your cholesterol levels checked more frequently.Ongoing high lipid and cholesterol  levels should be treated with medicines if diet and exercise are not working.  If you smoke, find out from your health care provider how to quit. If you do not use tobacco, do not start.  Lung cancer screening is recommended for adults aged 34-80 years who are at high risk for developing lung cancer because of a history of smoking. A yearly low-dose CT scan of the lungs is recommended for people who have at least a 30-pack-year history of smoking and are current smokers or have quit within the past 15 years. A pack year of smoking is smoking an average of 1 pack of cigarettes a day for 1 year (for example, a 30-pack-year history of smoking could mean smoking 1 pack a day for 30 years or 2 packs a day for 15 years). Yearly screening should continue until the smoker has stopped smoking for at least 15 years. Yearly screening should be stopped for people who develop a health problem that would prevent them from having lung cancer treatment.  If you choose to drink alcohol, do not have more than 2 drinks per day. One drink is considered to be 12 oz (360 mL) of beer, 5 oz (150 mL) of wine, or 1.5 oz (45 mL) of liquor.  Avoid the use of street drugs. Do not share needles with anyone. Ask for help if you need support or instructions about stopping the use of drugs.  High blood pressure causes heart disease and increases the risk of stroke. Blood pressure should be checked at least  every 1-2 years. Ongoing high blood pressure should be treated with medicines if weight loss and exercise are not effective.  If you are 4-67 years old, ask your health care provider if you should take aspirin to prevent heart disease.  Diabetes screening involves taking a blood sample to check your fasting blood sugar level. This should be done once every 3 years after age 57 if you are at a normal weight and without risk factors for diabetes. Testing should be considered at a younger age or be carried out more frequently if you  are overweight and have at least 1 risk factor for diabetes.  Colorectal cancer can be detected and often prevented. Most routine colorectal cancer screening begins at the age of 67 and continues through age 11. However, your health care provider may recommend screening at an earlier age if you have risk factors for colon cancer. On a yearly basis, your health care provider may provide home test kits to check for hidden blood in the stool. A small camera at the end of a tube may be used to directly examine the colon (sigmoidoscopy or colonoscopy) to detect the earliest forms of colorectal cancer. Talk to your health care provider about this at age 85 when routine screening begins. A direct exam of the colon should be repeated every 5-10 years through age 73, unless early forms of precancerous polyps or small growths are found.  People who are at an increased risk for hepatitis B should be screened for this virus. You are considered at high risk for hepatitis B if:  You were born in a country where hepatitis B occurs often. Talk with your health care provider about which countries are considered high risk.  Your parents were born in a high-risk country and you have not received a shot to protect against hepatitis B (hepatitis B vaccine).  You have HIV or AIDS.  You use needles to inject street drugs.  You live with, or have sex with, someone who has hepatitis B.  You are a man who has sex with other men (MSM).  You get hemodialysis treatment.  You take certain medicines for conditions like cancer, organ transplantation, and autoimmune conditions.  Hepatitis C blood testing is recommended for all people born from [birth year] through 1964 and any individual with known risk factors for hepatitis C.  Healthy men should no longer receive prostate-specific antigen (PSA) blood tests as part of routine cancer screening. Talk to your health care provider about prostate cancer screening.  Testicular cancer  screening is not recommended for adolescents or adult males who have no symptoms. Screening includes self-exam, a health care provider exam, and other screening tests. Consult with your health care provider about any symptoms you have or any concerns you have about testicular cancer.  Practice safe sex. Use condoms and avoid high-risk sexual practices to reduce the spread of sexually transmitted infections (STIs).  You should be screened for STIs, including gonorrhea and chlamydia if:  You are sexually active and are younger than 24 years.  You are older than 24 years, and your health care provider tells you that you are at risk for this type of infection.  Your sexual activity has changed since you were last screened, and you are at an increased risk for chlamydia or gonorrhea. Ask your health care provider if you are at risk.  If you are at risk of being infected with HIV, it is recommended that you take a prescription medicine daily to prevent  HIV infection. This is called pre-exposure prophylaxis (PrEP). You are considered at risk if:  You are a man who has sex with other men (MSM).  You are a heterosexual man who is sexually active with multiple partners.  You take drugs by injection.  You are sexually active with a partner who has HIV.  Talk with your health care provider about whether you are at high risk of being infected with HIV. If you choose to begin PrEP, you should first be tested for HIV. You should then be tested every 3 months for as long as you are taking PrEP.  Use sunscreen. Apply sunscreen liberally and repeatedly throughout the day. You should seek shade when your shadow is shorter than you. Protect yourself by wearing long sleeves, pants, a wide-brimmed hat, and sunglasses year round whenever you are outdoors.  Tell your health care provider of new moles or changes in moles, especially if there is a change in shape or color. Also, tell your health care provider if a  mole is larger than the size of a pencil eraser.  A one-time screening for abdominal aortic aneurysm (AAA) and surgical repair of large AAAs by ultrasound is recommended for men aged 56-75 years who are current or former smokers.  Stay current with your vaccines (immunizations). Document Released: 01/07/2008 Document Revised: 07/16/2013 Document Reviewed: 12/06/2010 [facility] Patient Information 2015 [place], [place]. This information is not intended to replace advice given to you by your health care provider. Make sure you discuss any questions you have with your health care provider.

## 2015-03-17 NOTE — Progress Notes (Signed)
Pre visit review using our clinic review tool, if applicable. No additional management support is needed unless otherwise documented below in the visit note. 

## 2015-03-19 ENCOUNTER — Encounter: Payer: Self-pay | Admitting: Emergency Medicine

## 2015-03-19 ENCOUNTER — Emergency Department
Admission: EM | Admit: 2015-03-19 | Discharge: 2015-03-19 | Disposition: A | Discharge status: Home / Self Care | Payer: BLUE CROSS/BLUE SHIELD | Attending: Student | Admitting: Student

## 2015-03-19 ENCOUNTER — Emergency Department: Payer: BLUE CROSS/BLUE SHIELD

## 2015-03-19 DIAGNOSIS — Z79899 Other long term (current) drug therapy: Secondary | ICD-10-CM | POA: Diagnosis not present

## 2015-03-19 DIAGNOSIS — Z87891 Personal history of nicotine dependence: Secondary | ICD-10-CM | POA: Diagnosis not present

## 2015-03-19 DIAGNOSIS — Z7902 Long term (current) use of antithrombotics/antiplatelets: Secondary | ICD-10-CM | POA: Insufficient documentation

## 2015-03-19 DIAGNOSIS — X58XXXA Exposure to other specified factors, initial encounter: Secondary | ICD-10-CM | POA: Diagnosis not present

## 2015-03-19 DIAGNOSIS — Y998 Other external cause status: Secondary | ICD-10-CM | POA: Insufficient documentation

## 2015-03-19 DIAGNOSIS — S29019A Strain of muscle and tendon of unspecified wall of thorax, initial encounter: Secondary | ICD-10-CM

## 2015-03-19 DIAGNOSIS — I10 Essential (primary) hypertension: Secondary | ICD-10-CM | POA: Diagnosis not present

## 2015-03-19 DIAGNOSIS — Y9389 Activity, other specified: Secondary | ICD-10-CM | POA: Insufficient documentation

## 2015-03-19 DIAGNOSIS — E119 Type 2 diabetes mellitus without complications: Secondary | ICD-10-CM | POA: Diagnosis not present

## 2015-03-19 DIAGNOSIS — Z23 Encounter for immunization: Secondary | ICD-10-CM | POA: Diagnosis not present

## 2015-03-19 DIAGNOSIS — S29012A Strain of muscle and tendon of back wall of thorax, initial encounter: Secondary | ICD-10-CM | POA: Insufficient documentation

## 2015-03-19 DIAGNOSIS — Z7982 Long term (current) use of aspirin: Secondary | ICD-10-CM | POA: Insufficient documentation

## 2015-03-19 DIAGNOSIS — S3992XA Unspecified injury of lower back, initial encounter: Secondary | ICD-10-CM | POA: Diagnosis present

## 2015-03-19 DIAGNOSIS — Y9289 Other specified places as the place of occurrence of the external cause: Secondary | ICD-10-CM | POA: Diagnosis not present

## 2015-03-19 LAB — URINALYSIS COMPLETE WITH MICROSCOPIC (ARMC ONLY)
Bacteria, UA: NONE SEEN
Bilirubin Urine: NEGATIVE
Glucose, UA: NEGATIVE mg/dL
Hgb urine dipstick: NEGATIVE
Ketones, ur: NEGATIVE mg/dL
Leukocytes, UA: NEGATIVE
Nitrite: NEGATIVE
Protein, ur: NEGATIVE mg/dL
Specific Gravity, Urine: 1.01 (ref 1.005–1.030)
Squamous Epithelial / LPF: NONE SEEN
pH: 7 (ref 5.0–8.0)

## 2015-03-19 MED ORDER — ORPHENADRINE CITRATE 30 MG/ML IJ SOLN
60.0000 mg | INTRAMUSCULAR | Status: AC
  Administered 2015-03-19: 60 mg via INTRAMUSCULAR
  Filled 2015-03-19: qty 2

## 2015-03-19 MED ORDER — KETOROLAC TROMETHAMINE 10 MG PO TABS: 10.0000 mg | ORAL_TABLET | Freq: Three times a day (TID) | ORAL | Status: DC

## 2015-03-19 MED ORDER — TETANUS-DIPHTH-ACELL PERTUSSIS 5-2.5-18.5 LF-MCG/0.5 IM SUSP
0.5000 mL | Freq: Once | INTRAMUSCULAR | Status: AC
  Administered 2015-03-19: 0.5 mL via INTRAMUSCULAR
  Filled 2015-03-19: qty 0.5

## 2015-03-19 MED ORDER — CYCLOBENZAPRINE HCL 5 MG PO TABS: 5.0000 mg | ORAL_TABLET | Freq: Three times a day (TID) | ORAL | Status: DC | PRN

## 2015-03-19 MED ORDER — KETOROLAC TROMETHAMINE 60 MG/2ML IM SOLN
60.0000 mg | Freq: Once | INTRAMUSCULAR | Status: AC
  Administered 2015-03-19: 60 mg via INTRAMUSCULAR
  Filled 2015-03-19: qty 2

## 2015-03-19 NOTE — Discharge Instructions (Signed)
Thoracic Strain You have injured the muscles or tendons that attach to the upper part of your back behind your chest. This injury is called a thoracic strain, thoracic sprain, or mid-back strain.  CAUSES  The cause of thoracic strain varies. A less severe injury involves pulling a muscle or tendon without tearing it. A more severe injury involves tearing (rupturing) a muscle or tendon. With less severe injuries, there may be little loss of strength. Sometimes, there are breaks (fractures) in the bones to which the muscles are attached. These fractures are rare, unless there was a direct hit (trauma) or you have weak bones due to osteoporosis or age. Longstanding strains may be caused by overuse or improper form during certain movements. Obesity can also increase your risk for back injuries. Sudden strains may occur due to injury or not warming up properly before exercise. Often, there is no obvious cause for a thoracic strain. SYMPTOMS  The main symptom is pain, especially with movement, such as during exercise. DIAGNOSIS  Your caregiver can usually tell what is wrong by taking an X-ray and doing a physical exam. TREATMENT   Physical therapy may be helpful for recovery. Your caregiver can give you exercises to do or refer you to a physical therapist after your pain improves.  After your pain improves, strengthening and conditioning programs appropriate for your sport or occupation may be helpful.  Always warm up before physical activities or athletics. Stretching after physical activity may also help.  Certain over-the-counter medicines may also help. Ask your caregiver if there are medicines that would help you. If this is your first thoracic strain injury, proper care and proper healing time before starting activities should prevent long-term problems. Torn ligaments and tendons require as long to heal as broken bones. Average healing times may be only 1 week for a mild strain. For torn muscles  and tendons, healing time may be up to 6 weeks to 2 months. HOME CARE INSTRUCTIONS   Apply ice to the injured area. Ice massages may also be used as directed.  Put ice in a plastic bag.  Place a towel between your skin and the bag.  Leave the ice on for 15-20 minutes, 03-04 times a day, for the first 2 days.  Only take over-the-counter or prescription medicines for pain, discomfort, or fever as directed by your caregiver.  Keep your appointments for physical therapy if this was prescribed.  Use wraps and back braces as instructed. SEEK IMMEDIATE MEDICAL CARE IF:   You have an increase in bruising, swelling, or pain.  Your pain has not improved with medicines.  You develop new shortness of breath, chest pain, or fever.  Problems seem to be getting worse rather than better. MAKE SURE YOU:   Understand these instructions.  Will watch your condition.  Will get help right away if you are not doing well or get worse. Document Released: 10/01/2003 Document Revised: 10/03/2011 Document Reviewed: 08/27/2010 [facility] Patient Information 2015 [place], [place]. This information is not intended to replace advice given to you by your health care provider. Make sure you discuss any questions you have with your health care provider.  Take the prescription meds as directed. Follow-up with Dr. Darnell Level as needed. Apply ice or moist heat as discussed.

## 2015-03-19 NOTE — ED Notes (Signed)
Patient states pain in left middle back started about 4am and was traveling back from being out of state.  Unable to describe pain, but hurts with inspiration and when twisting to the left side.  Pt was able to work today, but states pain is getting worse.  Denies chest pain or cough.  No difficulties urinating.

## 2015-03-19 NOTE — ED Notes (Addendum)
Patient to ER for c/o pain to back/left rib area since 0400. Patient denies any known injury. States pain is worst with twisting or movement. Denies any urinary symptoms.

## 2015-03-19 NOTE — ED Notes (Signed)
Patient transported to X-ray 

## 2015-03-19 NOTE — ED Notes (Signed)
Pt reports puncturing his left hand with an ice pick and screw driver while trying to open up the back of a BP machine to change the batteries. Pt concerned his tetanus shot is out of date and would like another.  PA notified.

## 2015-03-19 NOTE — ED Notes (Signed)
Patient with no complaints at this time. Respirations even and unlabored. Skin warm/dry. Discharge instructions reviewed with patient at this time. Patient given opportunity to voice concerns/ask questions. Patient discharged at this time and left Emergency Department with steady gait.   
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
## 2015-04-15 LAB — HM DIABETES EYE EXAM

## 2015-04-16 ENCOUNTER — Encounter: Payer: Self-pay | Admitting: Family Medicine

## 2015-04-26 ENCOUNTER — Other Ambulatory Visit: Payer: Self-pay | Admitting: Family Medicine

## 2015-06-25 DIAGNOSIS — Z8701 Personal history of pneumonia (recurrent): Secondary | ICD-10-CM

## 2015-06-25 HISTORY — DX: Personal history of pneumonia (recurrent): Z87.01

## 2015-06-30 ENCOUNTER — Encounter: Payer: Self-pay | Admitting: Cardiovascular Disease

## 2015-06-30 ENCOUNTER — Ambulatory Visit (INDEPENDENT_AMBULATORY_CARE_PROVIDER_SITE_OTHER): Payer: BLUE CROSS/BLUE SHIELD | Admitting: Cardiovascular Disease

## 2015-06-30 VITALS — BP 135/85 | HR 71 | Ht 68.0 in | Wt 249.8 lb

## 2015-06-30 DIAGNOSIS — I2510 Atherosclerotic heart disease of native coronary artery without angina pectoris: Secondary | ICD-10-CM | POA: Diagnosis not present

## 2015-06-30 DIAGNOSIS — E785 Hyperlipidemia, unspecified: Secondary | ICD-10-CM | POA: Diagnosis not present

## 2015-06-30 DIAGNOSIS — I10 Essential (primary) hypertension: Secondary | ICD-10-CM | POA: Diagnosis not present

## 2015-06-30 DIAGNOSIS — E1159 Type 2 diabetes mellitus with other circulatory complications: Secondary | ICD-10-CM | POA: Diagnosis not present

## 2015-06-30 MED ORDER — TRAMADOL HCL 50 MG PO TABS: 50.0000 mg | ORAL_TABLET | Freq: Three times a day (TID) | ORAL | Status: DC | PRN

## 2015-06-30 MED ORDER — CYCLOBENZAPRINE HCL 10 MG PO TABS: 10.0000 mg | ORAL_TABLET | Freq: Three times a day (TID) | ORAL | Status: DC | PRN

## 2015-06-30 MED ORDER — NITROGLYCERIN 0.4 MG SL SUBL: SUBLINGUAL_TABLET | SUBLINGUAL | Status: DC

## 2015-06-30 MED ORDER — SILDENAFIL CITRATE 20 MG PO TABS: 20.0000 mg | ORAL_TABLET | Freq: Three times a day (TID) | ORAL | Status: DC | PRN

## 2015-06-30 NOTE — Patient Instructions (Signed)
You are doing well. No medication changes were made.  Please call us if you have new issues that need to be addressed before your next appt.  Your physician wants you to follow-up in: 6 months.  You will receive a reminder letter in the mail two months in advance. If you don't receive a letter, please call our office to schedule the follow-up appointment.   
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
## 2015-06-30 NOTE — Assessment & Plan Note (Signed)
Cholesterol is at goal on the current lipid regimen. No changes to the medications were made.  

## 2015-06-30 NOTE — Assessment & Plan Note (Signed)
We have encouraged continued exercise, careful diet management in an effort to lose weight. 

## 2015-06-30 NOTE — Assessment & Plan Note (Signed)
blood pressure the high end of normal on arrival Repeat blood pressure improved He will monitor his blood pressure at home

## 2015-06-30 NOTE — Assessment & Plan Note (Signed)
Currently with no symptoms of angina. No further workup at this time. Continue current medication regimen. 

## 2015-07-06 ENCOUNTER — Emergency Department: Payer: BLUE CROSS/BLUE SHIELD

## 2015-07-06 ENCOUNTER — Encounter: Payer: Self-pay | Admitting: Emergency Medicine

## 2015-07-06 ENCOUNTER — Emergency Department
Admission: EM | Admit: 2015-07-06 | Discharge: 2015-07-06 | Disposition: A | Discharge status: Home / Self Care | Payer: BLUE CROSS/BLUE SHIELD | Attending: Emergency Medicine | Admitting: Emergency Medicine

## 2015-07-06 DIAGNOSIS — Z79899 Other long term (current) drug therapy: Secondary | ICD-10-CM | POA: Insufficient documentation

## 2015-07-06 DIAGNOSIS — Z87891 Personal history of nicotine dependence: Secondary | ICD-10-CM | POA: Insufficient documentation

## 2015-07-06 DIAGNOSIS — Z7984 Long term (current) use of oral hypoglycemic drugs: Secondary | ICD-10-CM | POA: Diagnosis not present

## 2015-07-06 DIAGNOSIS — E119 Type 2 diabetes mellitus without complications: Secondary | ICD-10-CM | POA: Diagnosis not present

## 2015-07-06 DIAGNOSIS — I10 Essential (primary) hypertension: Secondary | ICD-10-CM | POA: Insufficient documentation

## 2015-07-06 DIAGNOSIS — Z7982 Long term (current) use of aspirin: Secondary | ICD-10-CM | POA: Diagnosis not present

## 2015-07-06 DIAGNOSIS — K5732 Diverticulitis of large intestine without perforation or abscess without bleeding: Secondary | ICD-10-CM | POA: Diagnosis not present

## 2015-07-06 DIAGNOSIS — R1032 Left lower quadrant pain: Secondary | ICD-10-CM | POA: Diagnosis present

## 2015-07-06 LAB — COMPREHENSIVE METABOLIC PANEL
ALT: 58 U/L (ref 17–63)
AST: 30 U/L (ref 15–41)
Albumin: 4.2 g/dL (ref 3.5–5.0)
Alkaline Phosphatase: 52 U/L (ref 38–126)
Anion gap: 8 (ref 5–15)
BUN: 15 mg/dL (ref 6–20)
CO2: 27 mmol/L (ref 22–32)
Calcium: 8.8 mg/dL — ABNORMAL LOW (ref 8.9–10.3)
Chloride: 98 mmol/L — ABNORMAL LOW (ref 101–111)
Creatinine, Ser: 0.9 mg/dL (ref 0.61–1.24)
GFR calc Af Amer: >60 mL/min (ref >60)
GFR calc non Af Amer: >60 mL/min (ref >60)
Glucose, Bld: 228 mg/dL — ABNORMAL HIGH (ref 65–99)
Potassium: 4.1 mmol/L (ref 3.5–5.1)
Sodium: 133 mmol/L — ABNORMAL LOW (ref 135–145)
Total Bilirubin: 0.6 mg/dL (ref 0.3–1.2)
Total Protein: 7 g/dL (ref 6.5–8.1)

## 2015-07-06 LAB — URINALYSIS COMPLETE WITH MICROSCOPIC (ARMC ONLY)
Bacteria, UA: NONE SEEN
Bilirubin Urine: NEGATIVE
Glucose, UA: 50 mg/dL — AB
Hgb urine dipstick: NEGATIVE
Ketones, ur: NEGATIVE mg/dL
Leukocytes, UA: NEGATIVE
Nitrite: NEGATIVE
Protein, ur: NEGATIVE mg/dL
Specific Gravity, Urine: 1.012 (ref 1.005–1.030)
Squamous Epithelial / LPF: NONE SEEN
pH: 7 (ref 5.0–8.0)

## 2015-07-06 LAB — CBC WITH DIFFERENTIAL/PLATELET
Basophils Absolute: 0.1 10*3/uL (ref 0–0.1)
Basophils Relative: 1 %
Eosinophils Absolute: 0.2 10*3/uL (ref 0–0.7)
Eosinophils Relative: 2 %
HCT: 45.2 % (ref 40.0–52.0)
Hemoglobin: 15.3 g/dL (ref 13.0–18.0)
Lymphocytes Relative: 13 %
Lymphs Abs: 1.2 10*3/uL (ref 1.0–3.6)
MCH: 30.3 pg (ref 26.0–34.0)
MCHC: 33.8 g/dL (ref 32.0–36.0)
MCV: 89.7 fL (ref 80.0–100.0)
Monocytes Absolute: 0.9 10*3/uL (ref 0.2–1.0)
Monocytes Relative: 9 %
Neutro Abs: 7.1 10*3/uL — ABNORMAL HIGH (ref 1.4–6.5)
Neutrophils Relative %: 75 %
Platelets: 182 10*3/uL (ref 150–440)
RBC: 5.04 MIL/uL (ref 4.40–5.90)
RDW: 13.9 % (ref 11.5–14.5)
WBC: 9.5 10*3/uL (ref 3.8–10.6)

## 2015-07-06 LAB — LIPASE, BLOOD: Lipase: 28 U/L (ref 11–51)

## 2015-07-06 MED ORDER — IOHEXOL 240 MG/ML SOLN
25.0000 mL | Freq: Once | INTRAMUSCULAR | Status: AC | PRN
  Administered 2015-07-06: 25 mL via ORAL
  Filled 2015-07-06: qty 25

## 2015-07-06 MED ORDER — MORPHINE SULFATE (PF) 4 MG/ML IV SOLN
4.0000 mg | Freq: Once | INTRAVENOUS | Status: AC
  Administered 2015-07-06: 4 mg via INTRAVENOUS

## 2015-07-06 MED ORDER — ONDANSETRON HCL 4 MG/2ML IJ SOLN
4.0000 mg | Freq: Once | INTRAMUSCULAR | Status: AC
  Administered 2015-07-06: 4 mg via INTRAVENOUS

## 2015-07-06 MED ORDER — MORPHINE SULFATE (PF) 4 MG/ML IV SOLN
INTRAVENOUS | Status: AC
  Administered 2015-07-06: 4 mg via INTRAVENOUS
  Filled 2015-07-06: qty 1

## 2015-07-06 MED ORDER — METRONIDAZOLE 500 MG PO TABS: 500.0000 mg | ORAL_TABLET | Freq: Two times a day (BID) | ORAL | Status: DC

## 2015-07-06 MED ORDER — IOHEXOL 350 MG/ML SOLN
100.0000 mL | Freq: Once | INTRAVENOUS | Status: AC | PRN
  Administered 2015-07-06: 100 mL via INTRAVENOUS
  Filled 2015-07-06: qty 100

## 2015-07-06 MED ORDER — CIPROFLOXACIN HCL 500 MG PO TABS: 500.0000 mg | ORAL_TABLET | Freq: Two times a day (BID) | ORAL | Status: AC

## 2015-07-06 MED ORDER — MORPHINE SULFATE (PF) 4 MG/ML IV SOLN
INTRAVENOUS | Status: AC
  Filled 2015-07-06: qty 1

## 2015-07-06 MED ORDER — HYDROCODONE-ACETAMINOPHEN 5-325 MG PO TABS: 1.0000 | ORAL_TABLET | ORAL | Status: DC | PRN

## 2015-07-06 MED ORDER — ONDANSETRON HCL 4 MG/2ML IJ SOLN
INTRAMUSCULAR | Status: AC
  Administered 2015-07-06: 4 mg via INTRAVENOUS
  Filled 2015-07-06: qty 2

## 2015-07-06 NOTE — ED Notes (Signed)
Pt c/o LLQ pain with sudden onset since last night... Denies N/V/D.[name] States he has a hx of diverticulitis but states this does Not feel the same...denies Hx of kidney stones.

## 2015-07-06 NOTE — Discharge Instructions (Signed)
Diverticulitis °Diverticulitis is inflammation or infection of small pouches in your colon that form when you have a condition called diverticulosis. The pouches in your colon are called diverticula. Your colon, or large intestine, is where water is absorbed and stool is formed. °Complications of diverticulitis can include: °· Bleeding. °· Severe infection. °· Severe pain. °· Perforation of your colon. °· Obstruction of your colon. °CAUSES  °Diverticulitis is caused by bacteria. °Diverticulitis happens when stool becomes trapped in diverticula. This allows bacteria to grow in the diverticula, which can lead to inflammation and infection. °RISK FACTORS °People with diverticulosis are at risk for diverticulitis. Eating a diet that does not include enough fiber from fruits and vegetables may make diverticulitis more likely to develop. °SYMPTOMS  °Symptoms of diverticulitis may include: °· Abdominal pain and tenderness. The pain is normally located on the left side of the abdomen, but may occur in other areas. °· Fever and chills. °· Bloating. °· Cramping. °· Nausea. °· Vomiting. °· Constipation. °· Diarrhea. °· Blood in your stool. °DIAGNOSIS  °Your health care provider will ask you about your medical history and do a physical exam. You may need to have tests done because many medical conditions can cause the same symptoms as diverticulitis. Tests may include: °· Blood tests. °· Urine tests. °· Imaging tests of the abdomen, including X-rays and CT scans. °When your condition is under control, your health care provider may recommend that you have a colonoscopy. A colonoscopy can show how severe your diverticula are and whether something else is causing your symptoms. °TREATMENT  °Most cases of diverticulitis are mild and can be treated at home. Treatment may include: °· Taking over-the-counter pain medicines. °· Following a clear liquid diet. °· Taking antibiotic medicines by mouth for 7-10 days. °More severe cases may  be treated at a hospital. Treatment may include: °· Not eating or drinking. °· Taking prescription pain medicine. °· Receiving antibiotic medicines through an IV tube. °· Receiving fluids and nutrition through an IV tube. °· Surgery. °HOME CARE INSTRUCTIONS  °· Follow your health care provider's instructions carefully. °· Follow a full liquid diet or other diet as directed by your health care provider. After your symptoms improve, your health care provider may tell you to change your diet. He or she may recommend you eat a high-fiber diet. Fruits and vegetables are good sources of fiber. Fiber makes it easier to pass stool. °· Take fiber supplements or probiotics as directed by your health care provider. °· Only take medicines as directed by your health care provider. °· Keep all your follow-up appointments. °SEEK MEDICAL CARE IF:  °· Your pain does not improve. °· You have a hard time eating food. °· Your bowel movements do not return to normal. °SEEK IMMEDIATE MEDICAL CARE IF:  °· Your pain becomes worse. °· Your symptoms do not get better. °· Your symptoms suddenly get worse. °· You have a fever. °· You have repeated vomiting. °· You have bloody or black, tarry stools. °MAKE SURE YOU:  °· Understand these instructions. °· Will watch your condition. °· Will get help right away if you are not doing well or get worse. °  °This information is not intended to replace advice given to you by your health care provider. Make sure you discuss any questions you have with your health care provider. °  °Document Released: 04/20/2005 Document Revised: 07/16/2013 Document Reviewed: 06/05/2013 °Elsevier Interactive Patient Education ©2016 Elsevier Inc[employer] ° °

## 2015-07-06 NOTE — ED Notes (Signed)
Pt discharged home after verbalizing understanding of discharge instructions; nad noted. 
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
## 2015-07-25 ENCOUNTER — Other Ambulatory Visit: Payer: Self-pay | Admitting: Cardiovascular Disease

## 2015-09-05 ENCOUNTER — Other Ambulatory Visit: Payer: Self-pay | Admitting: Family Medicine

## 2015-09-05 DIAGNOSIS — E1159 Type 2 diabetes mellitus with other circulatory complications: Secondary | ICD-10-CM

## 2015-09-05 DIAGNOSIS — Z1159 Encounter for screening for other viral diseases: Secondary | ICD-10-CM

## 2015-09-08 ENCOUNTER — Other Ambulatory Visit: Payer: Self-pay | Admitting: Family Medicine

## 2015-09-08 ENCOUNTER — Other Ambulatory Visit (INDEPENDENT_AMBULATORY_CARE_PROVIDER_SITE_OTHER): Payer: BLUE CROSS/BLUE SHIELD

## 2015-09-08 DIAGNOSIS — E1159 Type 2 diabetes mellitus with other circulatory complications: Secondary | ICD-10-CM

## 2015-09-08 DIAGNOSIS — Z1159 Encounter for screening for other viral diseases: Secondary | ICD-10-CM

## 2015-09-08 LAB — BASIC METABOLIC PANEL
BUN: 13 mg/dL (ref 6–23)
CO2: 28 mEq/L (ref 19–32)
Calcium: 9.8 mg/dL (ref 8.4–10.5)
Chloride: 101 mEq/L (ref 96–112)
Creatinine, Ser: 0.92 mg/dL (ref 0.40–1.50)
GFR: 89.72 mL/min (ref >60.00)
Glucose, Bld: 140 mg/dL — ABNORMAL HIGH (ref 70–99)
Potassium: 4.3 mEq/L (ref 3.5–5.1)
Sodium: 137 mEq/L (ref 135–145)

## 2015-09-08 LAB — HEMOGLOBIN A1C: Hgb A1c MFr Bld: 7.1 % — ABNORMAL HIGH (ref 4.6–6.5)

## 2015-09-09 LAB — HEPATITIS C ANTIBODY: HCV Ab: NEGATIVE

## 2015-09-15 ENCOUNTER — Ambulatory Visit (INDEPENDENT_AMBULATORY_CARE_PROVIDER_SITE_OTHER): Payer: BLUE CROSS/BLUE SHIELD | Admitting: Family Medicine

## 2015-09-15 ENCOUNTER — Encounter: Payer: Self-pay | Admitting: Family Medicine

## 2015-09-15 VITALS — BP 114/68 | HR 78 | Temp 98.0°F | Wt 245.2 lb

## 2015-09-15 DIAGNOSIS — IMO0001 Reserved for inherently not codable concepts without codable children: Secondary | ICD-10-CM

## 2015-09-15 DIAGNOSIS — E785 Hyperlipidemia, unspecified: Secondary | ICD-10-CM | POA: Diagnosis not present

## 2015-09-15 DIAGNOSIS — I10 Essential (primary) hypertension: Secondary | ICD-10-CM

## 2015-09-15 DIAGNOSIS — IMO0002 Reserved for concepts with insufficient information to code with codable children: Secondary | ICD-10-CM

## 2015-09-15 DIAGNOSIS — E1159 Type 2 diabetes mellitus with other circulatory complications: Secondary | ICD-10-CM

## 2015-09-15 DIAGNOSIS — I2510 Atherosclerotic heart disease of native coronary artery without angina pectoris: Secondary | ICD-10-CM | POA: Diagnosis not present

## 2015-09-15 DIAGNOSIS — E1165 Type 2 diabetes mellitus with hyperglycemia: Secondary | ICD-10-CM

## 2015-09-15 NOTE — Assessment & Plan Note (Signed)
Continue crestor and fish oil.

## 2015-09-15 NOTE — Assessment & Plan Note (Signed)
Reviewed with patient trending A1c. Encouraged healthy diet and lifestyle changes for better control. Requests 6 mo lifestyle changes prior to med changes. Declines DSME referral today.  Recheck 6 mo.

## 2015-09-15 NOTE — Patient Instructions (Addendum)
Try CoQ 10 enzyme which may help for muscle function.  Focus over next 6 months on healthy diet and increased regular exercise in routine. Return in 6 months for physical and repeat labs. Continue current medicines as up to now.  Basic Carbohydrate Counting for Diabetes Mellitus Carbohydrate counting is a method for keeping track of the amount of carbohydrates you eat. Eating carbohydrates naturally increases the level of sugar (glucose) in your blood, so it is important for you to know the amount that is okay for you to have in every meal. Carbohydrate counting helps keep the level of glucose in your blood within normal limits. The amount of carbohydrates allowed is different for every person. A dietitian can help you calculate the amount that is right for you. Once you know the amount of carbohydrates you can have, you can count the carbohydrates in the foods you want to eat. Carbohydrates are found in the following foods:  Grains, such as breads and cereals.  Dried beans and soy products.  Starchy vegetables, such as potatoes, peas, and corn.  Fruit and fruit juices.  Milk and yogurt.  Sweets and snack foods, such as cake, cookies, candy, chips, soft drinks, and fruit drinks. CARBOHYDRATE COUNTING There are two ways to count the carbohydrates in your food. You can use either of the methods or a combination of both. Reading the "Nutrition Facts" on [place] The "Nutrition Facts" is an area that is included on the labels of almost all packaged food and beverages in the [place]. It includes the serving size of that food or beverage and information about the nutrients in each serving of the food, including the grams (g) of carbohydrate per serving.  Decide the number of servings of this food or beverage that you will be able to eat or drink. Multiply that number of servings by the number of grams of carbohydrate that is listed on the label for that serving. The total will be the  amount of carbohydrates you will be having when you eat or drink this food or beverage. Learning Standard Serving Sizes of Food When you eat food that is not packaged or does not include "Nutrition Facts" on the label, you need to measure the servings in order to count the amount of carbohydrates.A serving of most carbohydrate-rich foods contains about 15 g of carbohydrates. The following list includes serving sizes of carbohydrate-rich foods that provide 15 g ofcarbohydrate per serving:   1 slice of bread (1 oz) or 1 six-inch tortilla.    of a hamburger bun or English muffin.  4-6 crackers.   cup unsweetened dry cereal.    cup hot cereal.   cup rice or pasta.    cup mashed potatoes or  of a large baked potato.  1 cup fresh fruit or one small piece of fruit.    cup canned or frozen fruit or fruit juice.  1 cup milk.   cup plain fat-free yogurt or yogurt sweetened with artificial sweeteners.   cup cooked dried beans or starchy vegetable, such as peas, corn, or potatoes.  Decide the number of standard-size servings that you will eat. Multiply that number of servings by 15 (the grams of carbohydrates in that serving). For example, if you eat 2 cups of strawberries, you will have eaten 2 servings and 30 g of carbohydrates (2 servings x 15 g = 30 g). For foods such as soups and casseroles, in which more than one food is mixed in, you will need  to count the carbohydrates in each food that is included. EXAMPLE OF CARBOHYDRATE COUNTING Sample Dinner  3 oz chicken breast.   cup of brown rice.   cup of corn.  1 cup milk.   1 cup strawberries with sugar-free whipped topping.  Carbohydrate Calculation Step 1: Identify the foods that contain carbohydrates:   Rice.   Corn.   Milk.   Strawberries. Step 2:Calculate the number of servings eaten of each:   2 servings of rice.   1 serving of corn.   1 serving of milk.   1 serving of  strawberries. Step 3: Multiply each of those number of servings by 15 g:   2 servings of rice x 15 g = 30 g.   1 serving of corn x 15 g = 15 g.   1 serving of milk x 15 g = 15 g.   1 serving of strawberries x 15 g = 15 g. Step 4: Add together all of the amounts to find the total grams of carbohydrates eaten: 30 g + 15 g + 15 g + 15 g = 75 g.   This information is not intended to replace advice given to you by your health care provider. Make sure you discuss any questions you have with your health care provider.   Document Released: 07/11/2005 Document Revised: 08/01/2014 Document Reviewed: 06/07/2013 Elsevier Interactive Patient Education [employer].

## 2015-09-15 NOTE — Progress Notes (Signed)
BP 114/68 mmHg  Pulse 78  Temp(Src) 98 F (36.7 C) (Oral)  Wt 245 lb 4 oz (111.245 kg)   CC: 6 mo f/u DM   Subjective:    Patient ID: Eric [name], male    DOB: 09-05-1955, 60 y.o.   MRN: [id number]  HPI: Eric [name] is a 60 y.o. male presenting on 09/15/2015 for Follow-up   Recurrent diverticulitis flare then L basilar PNA treated with mucinex and abx (doesn't remember name) by [facility] in [place] CXR report. Fully resolved.   CAD - saw Dr Rockey Situ recently. On aspirin 162mg  and plavix daily.  DM - regularly does not check sugars.  Compliant with antihyperglycemic regimen which includes: metformin 1500mg  with breakfast. Denies low sugars or hypoglycemic symptoms.  Denies paresthesias. Last diabetic eye exam 03/2015 (glaucoma).  Pneumovax: 2015.  Prevnar: not due yet. Lab Results  Component Value Date   HGBA1C 7.1* 09/08/2015   Diabetic Foot Exam - Simple   Simple Foot Form  Diabetic Foot exam was performed with the following findings:  Yes 09/15/2015 11:54 AM  Visual Inspection  See comments:  Yes  Sensation Testing  Intact to touch and monofilament testing bilaterally:  Yes  Pulse Check  See comments:  Yes  Comments  Mild skin maceration between 4th/5th toes bilaterally Mildly diminished DP bilaterally       HTN - Compliant with current antihypertensive regimen of enalapril 40mg  daily, hctz 25mg  daily.  Does not check blood pressures at home.  No low blood pressure readings or symptoms of dizziness/syncope.  Denies HA, vision changes, CP/tightness, SOB, leg swelling.    HLD - compliant with crestor 40mg  daily and 3 fish oil daily without myalgias.  Relevant past medical, surgical, family and social history reviewed and updated as indicated. Interim medical history since our last visit reviewed. Allergies and medications reviewed and updated. Current Outpatient Prescriptions on File Prior to Visit  Medication Sig  . aspirin 162 MG EC tablet Take 162 mg by  mouth daily.  . clopidogrel (PLAVIX) 75 MG tablet Take 1 tablet (75 mg total) by mouth daily.  . cyclobenzaprine (FLEXERIL) 10 MG tablet Take 1 tablet (10 mg total) by mouth every 8 (eight) hours as needed for muscle spasms.  . diphenhydrAMINE (SOMINEX) 25 MG tablet Take 25 mg by mouth at bedtime as needed.  [name] Calcium (STOOL SOFTENER PO) Take by mouth daily.  . enalapril (VASOTEC) 20 MG tablet take 2 tablets by mouth once daily  . hydrochlorothiazide (HYDRODIURIL) 25 MG tablet Take 1 tablet (25 mg total) by mouth daily.  . metFORMIN (GLUCOPHAGE) 500 MG tablet take 3 tablets by mouth once daily with BREAKFAST  . Multiple Vitamins-Minerals (CENTRUM SILVER) tablet Take 1 tablet by mouth daily.    . nitroGLYCERIN (NITROSTAT) 0.4 MG SL tablet place 1 tablet under the tongue if needed every 5 minutes for chest pain for 3 doses IF NO RELIEF AFTER 3RD DOSE CALL PRESCRIBER OR 911.  . NON FORMULARY Vioprex 1,400 take two tablets daily for joints.  . Omega-3 Fatty Acids (FISH OIL CONCENTRATE) 1000 MG CAPS Take three tablets daily.  [name] omeprazole (PRILOSEC) 40 MG capsule Take 1 capsule (40 mg total) by mouth daily.  . rosuvastatin (CRESTOR) 40 MG tablet Take 1 tablet (40 mg total) by mouth daily.  . sildenafil (REVATIO) 20 MG tablet Take 1 tablet (20 mg total) by mouth 3 (three) times daily as needed.  . traMADol (ULTRAM) 50 MG tablet Take 1 tablet (  50 mg total) by mouth every 8 (eight) hours as needed.  [name] HYDROcodone-acetaminophen (NORCO/VICODIN) 5-325 MG tablet Take 1 tablet by mouth every 4 (four) hours as needed for moderate pain. (Patient not taking: Reported on 09/15/2015)   No current facility-administered medications on file prior to visit.    Review of Systems Per HPI unless specifically indicated in ROS section     Objective:    BP 114/68 mmHg  Pulse 78  Temp(Src) 98 F (36.7 C) (Oral)  Wt 245 lb 4 oz (111.245 kg)  Wt Readings from Last 3 Encounters:  09/15/15 245 lb 4 oz  (111.245 kg)  06/30/15 249 lb 12 oz (113.286 kg)  03/19/15 240 lb (108.863 kg)   Body mass index is 35.19 kg/(m^2).  Physical Exam  Constitutional: He appears well-developed and well-nourished. No distress.  HENT:  Head: Normocephalic and atraumatic.  [name] Ear: External ear normal.  Left Ear: External ear normal.  Nose: Nose normal.  Mouth/Throat: Oropharynx is clear and moist. No oropharyngeal exudate.  Eyes: Conjunctivae and EOM are normal. Pupils are equal, round, and reactive to light. No scleral icterus.  Neck: Normal range of motion. Neck supple.  Cardiovascular: Normal rate, regular rhythm, normal heart sounds and intact distal pulses.   No murmur heard. Pulmonary/Chest: Effort normal and breath sounds normal. No respiratory distress. He has no wheezes. He has no rales.  Musculoskeletal: He exhibits no edema.  See HPI for foot exam if done  Lymphadenopathy:    He has no cervical adenopathy.  Skin: Skin is warm and dry. No rash noted.  Psychiatric: He has a normal mood and affect.  Nursing note and vitals reviewed.  Results for orders placed or performed in visit on [id number]  Basic metabolic panel  Result Value Ref Range   Sodium 137 135 - 145 mEq/L   Potassium 4.3 3.5 - 5.1 mEq/L   Chloride 101 96 - 112 mEq/L   CO2 28 19 - 32 mEq/L   Glucose, Bld 140 (H) 70 - 99 mg/dL   BUN 13 6 - 23 mg/dL   Creatinine, Ser 0.92 0.40 - 1.50 mg/dL   Calcium 9.8 8.4 - 10.5 mg/dL   GFR 89.72 >60.00 mL/min  Hemoglobin A1c  Result Value Ref Range   Hgb A1c MFr Bld 7.1 (H) 4.6 - 6.5 %      Assessment & Plan:   Problem List Items Addressed This Visit    Obesity, Class II, BMI 35-39.9, with comorbidity    Discussed healthy diet and lifestyle changes to affect sustainable weight loss      HYPERTENSION, BENIGN    Chronic, stable. Continue current regimen.      Hyperlipidemia    Continue crestor and fish oil.      Diabetes type 2, uncontrolled ([place]) - Primary    Reviewed with  patient trending A1c. Encouraged healthy diet and lifestyle changes for better control. Requests 6 mo lifestyle changes prior to med changes. Declines DSME referral today.  Recheck 6 mo.      CAD, NATIVE VESSEL    Appreciate cards care of patient.          Follow up plan: Return in about 6 months (around 03/14/2016), or as needed, for annual exam, prior fasting for blood work.

## 2015-09-15 NOTE — Progress Notes (Signed)
Pre visit review using our clinic review tool, if applicable. No additional management support is needed unless otherwise documented below in the visit note. 

## 2015-09-15 NOTE — Assessment & Plan Note (Signed)
Discussed healthy diet and lifestyle changes to affect sustainable weight loss  

## 2015-09-15 NOTE — Assessment & Plan Note (Signed)
Appreciate cards care of patient.  

## 2015-09-15 NOTE — Assessment & Plan Note (Signed)
Chronic, stable. Continue current regimen. 

## 2015-09-23 ENCOUNTER — Other Ambulatory Visit: Payer: Self-pay | Admitting: Cardiovascular Disease

## 2015-10-20 ENCOUNTER — Other Ambulatory Visit: Payer: Self-pay | Admitting: Cardiovascular Disease

## 2015-10-21 ENCOUNTER — Telehealth: Payer: Self-pay | Admitting: Cardiovascular Disease

## 2015-10-21 NOTE — Telephone Encounter (Signed)
Pt calling stating asking about prescription  if we sent it in.  Checked on the refills looks to be denied relayed this to patient. Stated to him that we denied it and he was to go to PCP for this He understood and would do this.

## 2015-11-03 ENCOUNTER — Other Ambulatory Visit: Payer: Self-pay | Admitting: Cardiovascular Disease

## 2015-11-03 MED ORDER — CIPROFLOXACIN HCL 500 MG PO TABS: 500.0000 mg | ORAL_TABLET | Freq: Two times a day (BID) | ORAL | Status: DC

## 2015-11-03 MED ORDER — METRONIDAZOLE 500 MG PO TABS: 500.0000 mg | ORAL_TABLET | Freq: Three times a day (TID) | ORAL | Status: DC

## 2015-11-04 ENCOUNTER — Emergency Department
Admission: EM | Admit: 2015-11-04 | Discharge: 2015-11-04 | Disposition: A | Discharge status: Home / Self Care | Payer: BLUE CROSS/BLUE SHIELD | Attending: Emergency Medicine | Admitting: Emergency Medicine

## 2015-11-04 ENCOUNTER — Emergency Department: Payer: BLUE CROSS/BLUE SHIELD

## 2015-11-04 ENCOUNTER — Encounter: Payer: Self-pay | Admitting: *Deleted

## 2015-11-04 DIAGNOSIS — Z79899 Other long term (current) drug therapy: Secondary | ICD-10-CM | POA: Diagnosis not present

## 2015-11-04 DIAGNOSIS — E669 Obesity, unspecified: Secondary | ICD-10-CM | POA: Insufficient documentation

## 2015-11-04 DIAGNOSIS — I10 Essential (primary) hypertension: Secondary | ICD-10-CM | POA: Diagnosis not present

## 2015-11-04 DIAGNOSIS — R1031 Right lower quadrant pain: Secondary | ICD-10-CM | POA: Insufficient documentation

## 2015-11-04 DIAGNOSIS — E785 Hyperlipidemia, unspecified: Secondary | ICD-10-CM | POA: Insufficient documentation

## 2015-11-04 DIAGNOSIS — Z87891 Personal history of nicotine dependence: Secondary | ICD-10-CM | POA: Diagnosis not present

## 2015-11-04 DIAGNOSIS — E119 Type 2 diabetes mellitus without complications: Secondary | ICD-10-CM | POA: Insufficient documentation

## 2015-11-04 DIAGNOSIS — M1990 Unspecified osteoarthritis, unspecified site: Secondary | ICD-10-CM | POA: Insufficient documentation

## 2015-11-04 DIAGNOSIS — K5792 Diverticulitis of intestine, part unspecified, without perforation or abscess without bleeding: Secondary | ICD-10-CM | POA: Insufficient documentation

## 2015-11-04 DIAGNOSIS — Z791 Long term (current) use of non-steroidal anti-inflammatories (NSAID): Secondary | ICD-10-CM | POA: Insufficient documentation

## 2015-11-04 DIAGNOSIS — Z7984 Long term (current) use of oral hypoglycemic drugs: Secondary | ICD-10-CM | POA: Diagnosis not present

## 2015-11-04 DIAGNOSIS — Z7982 Long term (current) use of aspirin: Secondary | ICD-10-CM | POA: Diagnosis not present

## 2015-11-04 DIAGNOSIS — I2510 Atherosclerotic heart disease of native coronary artery without angina pectoris: Secondary | ICD-10-CM | POA: Insufficient documentation

## 2015-11-04 LAB — COMPREHENSIVE METABOLIC PANEL
ALT: 82 U/L — ABNORMAL HIGH (ref 17–63)
AST: 45 U/L — ABNORMAL HIGH (ref 15–41)
Albumin: 4.2 g/dL (ref 3.5–5.0)
Alkaline Phosphatase: 51 U/L (ref 38–126)
Anion gap: 7 (ref 5–15)
BUN: 12 mg/dL (ref 6–20)
CO2: 24 mmol/L (ref 22–32)
Calcium: 9 mg/dL (ref 8.9–10.3)
Chloride: 102 mmol/L (ref 101–111)
Creatinine, Ser: 0.86 mg/dL (ref 0.61–1.24)
GFR calc Af Amer: >60 mL/min (ref >60)
GFR calc non Af Amer: >60 mL/min (ref >60)
Glucose, Bld: 104 mg/dL — ABNORMAL HIGH (ref 65–99)
Potassium: 3.8 mmol/L (ref 3.5–5.1)
Sodium: 133 mmol/L — ABNORMAL LOW (ref 135–145)
Total Bilirubin: 0.7 mg/dL (ref 0.3–1.2)
Total Protein: 7.3 g/dL (ref 6.5–8.1)

## 2015-11-04 LAB — CBC
HCT: 45.7 % (ref 40.0–52.0)
Hemoglobin: 15.6 g/dL (ref 13.0–18.0)
MCH: 29.7 pg (ref 26.0–34.0)
MCHC: 34.1 g/dL (ref 32.0–36.0)
MCV: 87 fL (ref 80.0–100.0)
Platelets: 180 10*3/uL (ref 150–440)
RBC: 5.26 MIL/uL (ref 4.40–5.90)
RDW: 14 % (ref 11.5–14.5)
WBC: 7.4 10*3/uL (ref 3.8–10.6)

## 2015-11-04 LAB — URINALYSIS COMPLETE WITH MICROSCOPIC (ARMC ONLY)
Bacteria, UA: NONE SEEN
Bilirubin Urine: NEGATIVE
Glucose, UA: NEGATIVE mg/dL
Hgb urine dipstick: NEGATIVE
Ketones, ur: NEGATIVE mg/dL
Leukocytes, UA: NEGATIVE
Nitrite: NEGATIVE
Protein, ur: NEGATIVE mg/dL
Specific Gravity, Urine: 1.011 (ref 1.005–1.030)
Squamous Epithelial / LPF: NONE SEEN
WBC, UA: NONE SEEN WBC/hpf (ref 0–5)
pH: 7 (ref 5.0–8.0)

## 2015-11-04 LAB — LIPASE, BLOOD: Lipase: 21 U/L (ref 11–51)

## 2015-11-04 MED ORDER — IOPAMIDOL (ISOVUE-300) INJECTION 61%
100.0000 mL | Freq: Once | INTRAVENOUS | Status: AC | PRN
  Administered 2015-11-04: 100 mL via INTRAVENOUS
  Filled 2015-11-04: qty 100

## 2015-11-04 MED ORDER — DICYCLOMINE HCL 20 MG PO TABS: 20.0000 mg | ORAL_TABLET | Freq: Three times a day (TID) | ORAL | Status: DC | PRN

## 2015-11-04 MED ORDER — KETOROLAC TROMETHAMINE 30 MG/ML IJ SOLN
15.0000 mg | Freq: Once | INTRAMUSCULAR | Status: AC
  Administered 2015-11-04: 15 mg via INTRAVENOUS
  Filled 2015-11-04: qty 1

## 2015-11-04 MED ORDER — DIATRIZOATE MEGLUMINE & SODIUM 66-10 % PO SOLN
15.0000 mL | Freq: Once | ORAL | Status: AC
  Administered 2015-11-04: 15 mL via ORAL

## 2015-11-04 NOTE — Discharge Instructions (Signed)
Abdominal Pain, Adult °Many things can cause abdominal pain. Usually, abdominal pain is not caused by a disease and will improve without treatment. It can often be observed and treated at home. Your health care provider will do a physical exam and possibly order blood tests and X-rays to help determine the seriousness of your pain. However, in many cases, more time must pass before a clear cause of the pain can be found. Before that point, your health care provider may not know if you need more testing or further treatment. °HOME CARE INSTRUCTIONS °Monitor your abdominal pain for any changes. The following actions may help to alleviate any discomfort you are experiencing: °· Only take over-the-counter or prescription medicines as directed by your health care provider. °· Do not take laxatives unless directed to do so by your health care provider. °· Try a clear liquid diet (broth, tea, or water) as directed by your health care provider. Slowly move to a bland diet as tolerated. °SEEK MEDICAL CARE IF: °· You have unexplained abdominal pain. °· You have abdominal pain associated with nausea or diarrhea. °· You have pain when you urinate or have a bowel movement. °· You experience abdominal pain that wakes you in the night. °· You have abdominal pain that is worsened or improved by eating food. °· You have abdominal pain that is worsened with eating fatty foods. °· You have a fever. °SEEK IMMEDIATE MEDICAL CARE IF: °· Your pain does not go away within 2 hours. °· You keep throwing up (vomiting). °· Your pain is felt only in portions of the abdomen, such as the right side or the left lower portion of the abdomen. °· You pass bloody or black tarry stools. °MAKE SURE YOU: °· Understand these instructions. °· Will watch your condition. °· Will get help right away if you are not doing well or get worse. °  °This information is not intended to replace advice given to you by your health care provider. Make sure you discuss  any questions you have with your health care provider. °  °Document Released: 04/20/2005 Document Revised: 04/01/2015 Document Reviewed: 03/20/2013 °Elsevier Interactive Patient Education ©2016 [employer] ° ° °Please return immediately if condition worsens. Please contact her primary physician or the physician you were given for referral. If you have any specialist physicians involved in her treatment and plan please also contact them. Thank you for using [facility] [facility]. ° °
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
## 2015-11-04 NOTE — ED Notes (Addendum)
States some RLQ abd pain that began last night, denies any nausea or vomiting, states hx of diverticulitis

## 2015-11-24 ENCOUNTER — Ambulatory Visit (INDEPENDENT_AMBULATORY_CARE_PROVIDER_SITE_OTHER): Payer: BLUE CROSS/BLUE SHIELD | Admitting: Family Medicine

## 2015-11-24 ENCOUNTER — Encounter: Payer: Self-pay | Admitting: Family Medicine

## 2015-11-24 VITALS — BP 136/80 | HR 84 | Temp 97.7°F | Wt 242.0 lb

## 2015-11-24 DIAGNOSIS — R1031 Right lower quadrant pain: Secondary | ICD-10-CM | POA: Diagnosis not present

## 2015-11-24 LAB — CBC WITH DIFFERENTIAL/PLATELET
Basophils Absolute: 0.1 10*3/uL (ref 0.0–0.1)
Basophils Relative: 0.7 % (ref 0.0–3.0)
Eosinophils Absolute: 0.4 10*3/uL (ref 0.0–0.7)
Eosinophils Relative: 5 % (ref 0.0–5.0)
HCT: 48.1 % (ref 39.0–52.0)
Hemoglobin: 16.3 g/dL (ref 13.0–17.0)
Lymphocytes Relative: 22.3 % (ref 12.0–46.0)
Lymphs Abs: 2 10*3/uL (ref 0.7–4.0)
MCHC: 33.9 g/dL (ref 30.0–36.0)
MCV: 87.8 fl (ref 78.0–100.0)
Monocytes Absolute: 0.8 10*3/uL (ref 0.1–1.0)
Monocytes Relative: 9.5 % (ref 3.0–12.0)
Neutro Abs: 5.6 10*3/uL (ref 1.4–7.7)
Neutrophils Relative %: 62.5 % (ref 43.0–77.0)
Platelets: 213 10*3/uL (ref 150.0–400.0)
RBC: 5.48 Mil/uL (ref 4.22–5.81)
RDW: 14.4 % (ref 11.5–15.5)
WBC: 8.9 10*3/uL (ref 4.0–10.5)

## 2015-11-24 LAB — POC URINALSYSI DIPSTICK (AUTOMATED)
Bilirubin, UA: NEGATIVE
Blood, UA: NEGATIVE
Glucose, UA: NEGATIVE
Ketones, UA: NEGATIVE
Leukocytes, UA: NEGATIVE
Nitrite, UA: NEGATIVE
Protein, UA: NEGATIVE
Spec Grav, UA: 1.015
Urobilinogen, UA: 0.2
pH, UA: 7.5

## 2015-11-24 NOTE — Progress Notes (Signed)
Pre visit review using our clinic review tool, if applicable. No additional management support is needed unless otherwise documented below in the visit note. 

## 2015-11-24 NOTE — Progress Notes (Signed)
BP 136/80 mmHg  Pulse 84  Temp(Src) 97.7 F (36.5 C) (Oral)  Wt 242 lb (109.77 kg)   CC: RLQ abd pain  Subjective:    Patient ID: Eric [name], male    DOB: 1956-06-01, 60 y.o.   MRN: [id number]  HPI: Eric [name] is a 60 y.o. male presenting on 11/24/2015 for Abdominal Pain   Seen at ER 11/04/2015 with RLQ abd pain. CT showed diverticulosis without active inflammation, WBC was normal. Treated with dicyclomine 20mg  prn which may be helping. Saturday night had another episode of severe RLQ abdominal pain. Had BBQ ribs for dinner that night. "Feels like diverticulitis". Treated with muscle relaxant which helped as well. Getting better. No fevers/chills, nausea/vomiting, blood in stool. Last night he did have episode of diarrhea. Appetite ok. Describes steady pain. Some improvement after BM. Avoids corn.   CT also showed some fatty liver and aortoiliac atherosclerosis.  He is on enalapril 40mg  daily.   H/o recurrent diverticulitis (2009, 2012, 2015, 2016). Tends to get this once yearly.   COLONOSCOPY Date: 2011 mult diverticula, int hem, rpt 10 yrs [name])  Relevant past medical, surgical, family and social history reviewed and updated as indicated. Interim medical history since our last visit reviewed. Allergies and medications reviewed and updated. Current Outpatient Prescriptions on File Prior to Visit  Medication Sig  . aspirin 162 MG EC tablet Take 162 mg by mouth daily.  . clopidogrel (PLAVIX) 75 MG tablet Take 1 tablet (75 mg total) by mouth daily.  . cyclobenzaprine (FLEXERIL) 10 MG tablet Take 1 tablet (10 mg total) by mouth every 8 (eight) hours as needed for muscle spasms.  [name] dicyclomine (BENTYL) 20 MG tablet Take 1 tablet (20 mg total) by mouth 3 (three) times daily as needed for spasms.  . diphenhydrAMINE (SOMINEX) 25 MG tablet Take 25 mg by mouth at bedtime as needed.  [name] Calcium (STOOL SOFTENER PO) Take by mouth daily.  . enalapril (VASOTEC) 20 MG  tablet take 2 tablets by mouth once daily  . hydrochlorothiazide (HYDRODIURIL) 25 MG tablet Take 1 tablet (25 mg total) by mouth daily.  [name] HYDROcodone-acetaminophen (NORCO/VICODIN) 5-325 MG tablet Take 1 tablet by mouth every 4 (four) hours as needed for moderate pain.  . metFORMIN (GLUCOPHAGE) 500 MG tablet take 3 tablets by mouth once daily with BREAKFAST  . Multiple Vitamins-Minerals (CENTRUM SILVER) tablet Take 1 tablet by mouth daily.    . nitroGLYCERIN (NITROSTAT) 0.4 MG SL tablet place 1 tablet under the tongue if needed every 5 minutes for chest pain for 3 doses IF NO RELIEF AFTER 3RD DOSE CALL PRESCRIBER OR 911.  . NON FORMULARY Vioprex 1,400 take two tablets daily for joints.  . Omega-3 Fatty Acids (FISH OIL CONCENTRATE) 1000 MG CAPS Take three tablets daily.  [name] omeprazole (PRILOSEC) 40 MG capsule take 1 capsule by mouth once daily  . rosuvastatin (CRESTOR) 40 MG tablet Take 1 tablet (40 mg total) by mouth daily.  . traMADol (ULTRAM) 50 MG tablet Take 1 tablet (50 mg total) by mouth every 8 (eight) hours as needed.  . sildenafil (REVATIO) 20 MG tablet Take 1 tablet (20 mg total) by mouth 3 (three) times daily as needed. (Patient not taking: Reported on 11/24/2015)   No current facility-administered medications on file prior to visit.    Review of Systems Per HPI unless specifically indicated in ROS section     Objective:    BP 136/80 mmHg  Pulse 84  Temp(Src) 97.7  F (36.5 C) (Oral)  Wt 242 lb (109.77 kg)  Wt Readings from Last 3 Encounters:  11/24/15 242 lb (109.77 kg)  11/04/15 245 lb (111.131 kg)  09/15/15 245 lb 4 oz (111.245 kg)    Physical Exam  Constitutional: He appears well-developed and well-nourished. No distress.  HENT:  Mouth/Throat: Oropharynx is clear and moist. No oropharyngeal exudate.  Cardiovascular: Normal rate, regular rhythm, normal heart sounds and intact distal pulses.   No murmur heard. Pulmonary/Chest: Effort normal. No respiratory distress. He  has no wheezes. He has no rales.  Abdominal: Soft. Normal appearance and bowel sounds are normal. He exhibits no distension and no mass. There is no hepatosplenomegaly. There is tenderness (mild) in the right lower quadrant. There is no rigidity, no rebound, no guarding, no CVA tenderness and negative Murphy's sign.  Musculoskeletal: He exhibits no edema.  Skin: Skin is warm and dry. No rash noted.  Nursing note and vitals reviewed.  Results for orders placed or performed during the hospital encounter of 11/04/15  Lipase, blood  Result Value Ref Range   Lipase 21 11 - 51 U/L  Comprehensive metabolic panel  Result Value Ref Range   Sodium 133 (L) 135 - 145 mmol/L   Potassium 3.8 3.5 - 5.1 mmol/L   Chloride 102 101 - 111 mmol/L   CO2 24 22 - 32 mmol/L   Glucose, Bld 104 (H) 65 - 99 mg/dL   BUN 12 6 - 20 mg/dL   Creatinine, Ser 0.86 0.61 - 1.24 mg/dL   Calcium 9.0 8.9 - 10.3 mg/dL   Total Protein 7.3 6.5 - 8.1 g/dL   Albumin 4.2 3.5 - 5.0 g/dL   AST 45 (H) 15 - 41 U/L   ALT 82 (H) 17 - 63 U/L   Alkaline Phosphatase 51 38 - 126 U/L   Total Bilirubin 0.7 0.3 - 1.2 mg/dL   GFR calc non Af Amer >60 >60 mL/min   GFR calc Af Amer >60 >60 mL/min   Anion gap 7 5 - 15  CBC  Result Value Ref Range   WBC 7.4 3.8 - 10.6 K/uL   RBC 5.26 4.40 - 5.90 MIL/uL   Hemoglobin 15.6 13.0 - 18.0 g/dL   HCT 45.7 40.0 - 52.0 %   MCV 87.0 80.0 - 100.0 fL   MCH 29.7 26.0 - 34.0 pg   MCHC 34.1 32.0 - 36.0 g/dL   RDW 14.0 11.5 - 14.5 %   Platelets 180 150 - 440 K/uL  Urinalysis complete, with microscopic ([facility] only)  Result Value Ref Range   Color, Urine STRAW (A) YELLOW   APPearance CLEAR (A) CLEAR   Glucose, UA NEGATIVE NEGATIVE mg/dL   Bilirubin Urine NEGATIVE NEGATIVE   Ketones, ur NEGATIVE NEGATIVE mg/dL   Specific Gravity, Urine 1.011 1.005 - 1.030   Hgb urine dipstick NEGATIVE NEGATIVE   pH 7.0 5.0 - 8.0   Protein, ur NEGATIVE NEGATIVE mg/dL   Nitrite NEGATIVE NEGATIVE   Leukocytes, UA  NEGATIVE NEGATIVE   RBC / HPF 0-5 0 - 5 RBC/hpf   WBC, UA NONE SEEN 0 - 5 WBC/hpf   Bacteria, UA NONE SEEN NONE SEEN   Squamous Epithelial / LPF NONE SEEN NONE SEEN      Assessment & Plan:   Problem List Items Addressed This Visit    Right lower quadrant abdominal pain - Primary    3 wks ago RLQ pain s/p eval at ER with labs and abd/pelvic CT - not consistent with  diverticulitis.  Now over last 4 days recurrent RLQ pain, but seems to be improving without abx. Pt has changed to low fiber diet.  Check CBC today - if leukocytosis, treat with abx. Otherwise continue bland low fiber diet. May continue dicyclomine prn.  Last colonoscopy 2011. Has had 3 episodes of diverticulitis since then. Will refer back to GI for recurrent diverticulitis.       Relevant Orders   POCT Urinalysis Dipstick (Automated)   Ambulatory referral to Gastroenterology   CBC with Differential/Platelet       Follow up plan: Return if symptoms worsen or fail to improve.  Ria Bush, MD

## 2015-11-24 NOTE — Assessment & Plan Note (Signed)
3 wks ago RLQ pain s/p eval at ER with labs and abd/pelvic CT - not consistent with diverticulitis.  Now over last 4 days recurrent RLQ pain, but seems to be improving without abx. Pt has changed to low fiber diet.  Check CBC today - if leukocytosis, treat with abx. Otherwise continue bland low fiber diet. May continue dicyclomine prn.  Last colonoscopy 2011. Has had 3 episodes of diverticulitis since then. Will refer back to GI for recurrent diverticulitis.

## 2015-11-24 NOTE — Patient Instructions (Addendum)
CBC today.  While having a flare I want you to eat bland diet - rice, applesauce, toast, clears like ginger ale, chicken broth, jello. Give bowels a rest.   When feeling better, I recommend high fiber diet.  I don't think this is irritable bowel.  We will refer you back to Dr [name].

## 2015-12-22 ENCOUNTER — Other Ambulatory Visit: Payer: Self-pay | Admitting: Cardiovascular Disease

## 2015-12-22 ENCOUNTER — Telehealth: Payer: Self-pay | Admitting: Cardiovascular Disease

## 2015-12-22 NOTE — Telephone Encounter (Signed)
Received cardiac clearance request for pt to proceed w/ colonoscopy on 03/14/16 w/ [facility]. They would like to know if pt can hold plavix 5 days before procedure.  Placed on Dr. Donivan Scull desk for review.

## 2015-12-23 NOTE — Telephone Encounter (Signed)
Route to [facility] [phone].

## 2015-12-23 NOTE — Telephone Encounter (Signed)
Acceptable risk for colonoscopy Would stay on aspirin Okay to hold Plavix 5 days before procedure

## 2016-01-12 ENCOUNTER — Ambulatory Visit (INDEPENDENT_AMBULATORY_CARE_PROVIDER_SITE_OTHER): Payer: BLUE CROSS/BLUE SHIELD | Admitting: Cardiovascular Disease

## 2016-01-12 ENCOUNTER — Encounter: Payer: Self-pay | Admitting: Cardiovascular Disease

## 2016-01-12 VITALS — BP 130/80 | HR 75 | Ht 68.5 in | Wt 244.0 lb

## 2016-01-12 DIAGNOSIS — R1031 Right lower quadrant pain: Secondary | ICD-10-CM

## 2016-01-12 DIAGNOSIS — E785 Hyperlipidemia, unspecified: Secondary | ICD-10-CM

## 2016-01-12 DIAGNOSIS — IMO0002 Reserved for concepts with insufficient information to code with codable children: Secondary | ICD-10-CM

## 2016-01-12 DIAGNOSIS — I10 Essential (primary) hypertension: Secondary | ICD-10-CM | POA: Diagnosis not present

## 2016-01-12 DIAGNOSIS — I2510 Atherosclerotic heart disease of native coronary artery without angina pectoris: Secondary | ICD-10-CM | POA: Diagnosis not present

## 2016-01-12 DIAGNOSIS — IMO0001 Reserved for inherently not codable concepts without codable children: Secondary | ICD-10-CM

## 2016-01-12 DIAGNOSIS — E1165 Type 2 diabetes mellitus with hyperglycemia: Secondary | ICD-10-CM

## 2016-01-12 DIAGNOSIS — E1159 Type 2 diabetes mellitus with other circulatory complications: Secondary | ICD-10-CM

## 2016-01-12 NOTE — Patient Instructions (Signed)
You are doing well. No medication changes were made.  Please call us if you have new issues that need to be addressed before your next appt.  Your physician wants you to follow-up in: 6 months.  You will receive a reminder letter in the mail two months in advance. If you don't receive a letter, please call our office to schedule the follow-up appointment.   
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
## 2016-01-18 ENCOUNTER — Emergency Department
Admission: EM | Admit: 2016-01-18 | Discharge: 2016-01-18 | Disposition: A | Discharge status: Home / Self Care | Payer: No Typology Code available for payment source | Attending: Emergency Medicine | Admitting: Emergency Medicine

## 2016-01-18 DIAGNOSIS — Z955 Presence of coronary angioplasty implant and graft: Secondary | ICD-10-CM | POA: Diagnosis not present

## 2016-01-18 DIAGNOSIS — S161XXA Strain of muscle, fascia and tendon at neck level, initial encounter: Secondary | ICD-10-CM | POA: Insufficient documentation

## 2016-01-18 DIAGNOSIS — Z7984 Long term (current) use of oral hypoglycemic drugs: Secondary | ICD-10-CM | POA: Insufficient documentation

## 2016-01-18 DIAGNOSIS — Z79899 Other long term (current) drug therapy: Secondary | ICD-10-CM | POA: Diagnosis not present

## 2016-01-18 DIAGNOSIS — E785 Hyperlipidemia, unspecified: Secondary | ICD-10-CM | POA: Insufficient documentation

## 2016-01-18 DIAGNOSIS — S29012A Strain of muscle and tendon of back wall of thorax, initial encounter: Secondary | ICD-10-CM | POA: Diagnosis not present

## 2016-01-18 DIAGNOSIS — Y9389 Activity, other specified: Secondary | ICD-10-CM | POA: Diagnosis not present

## 2016-01-18 DIAGNOSIS — Z7982 Long term (current) use of aspirin: Secondary | ICD-10-CM | POA: Insufficient documentation

## 2016-01-18 DIAGNOSIS — M1990 Unspecified osteoarthritis, unspecified site: Secondary | ICD-10-CM | POA: Insufficient documentation

## 2016-01-18 DIAGNOSIS — I10 Essential (primary) hypertension: Secondary | ICD-10-CM | POA: Insufficient documentation

## 2016-01-18 DIAGNOSIS — Y92488 Other paved roadways as the place of occurrence of the external cause: Secondary | ICD-10-CM | POA: Diagnosis not present

## 2016-01-18 DIAGNOSIS — S199XXA Unspecified injury of neck, initial encounter: Secondary | ICD-10-CM | POA: Diagnosis present

## 2016-01-18 DIAGNOSIS — E119 Type 2 diabetes mellitus without complications: Secondary | ICD-10-CM | POA: Diagnosis not present

## 2016-01-18 DIAGNOSIS — I2510 Atherosclerotic heart disease of native coronary artery without angina pectoris: Secondary | ICD-10-CM | POA: Insufficient documentation

## 2016-01-18 DIAGNOSIS — T148XXA Other injury of unspecified body region, initial encounter: Secondary | ICD-10-CM

## 2016-01-18 DIAGNOSIS — Y999 Unspecified external cause status: Secondary | ICD-10-CM | POA: Diagnosis not present

## 2016-01-18 DIAGNOSIS — Z87891 Personal history of nicotine dependence: Secondary | ICD-10-CM | POA: Diagnosis not present

## 2016-01-18 MED ORDER — CYCLOBENZAPRINE HCL 5 MG PO TABS: 5.0000 mg | ORAL_TABLET | Freq: Three times a day (TID) | ORAL | Status: DC | PRN

## 2016-01-18 MED ORDER — HYDROCODONE-ACETAMINOPHEN 5-325 MG PO TABS: 1.0000 | ORAL_TABLET | Freq: Four times a day (QID) | ORAL | Status: DC | PRN
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
## 2016-01-21 ENCOUNTER — Other Ambulatory Visit: Payer: Self-pay | Admitting: Cardiovascular Disease

## 2016-01-29 ENCOUNTER — Encounter: Payer: Self-pay | Admitting: Family Medicine

## 2016-01-29 ENCOUNTER — Ambulatory Visit (INDEPENDENT_AMBULATORY_CARE_PROVIDER_SITE_OTHER): Payer: BLUE CROSS/BLUE SHIELD | Admitting: Family Medicine

## 2016-01-29 VITALS — BP 122/70 | HR 78 | Temp 98.5°F | Wt 237.5 lb

## 2016-01-29 DIAGNOSIS — G5711 Meralgia paresthetica, right lower limb: Secondary | ICD-10-CM | POA: Diagnosis not present
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
## 2016-01-29 NOTE — Patient Instructions (Addendum)
Meralgia paresthetica- likely nerve irritation on the side of the thigh.  Lateral femoral cutaneous nerve.  This usually gets better on its own.  Keep walking and stretching your back.   Take care.  Glad to see you.  Change the date on your lab visit.

## 2016-01-30 DIAGNOSIS — G5710 Meralgia paresthetica, unspecified lower limb: Secondary | ICD-10-CM | POA: Insufficient documentation
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
## 2016-03-06 ENCOUNTER — Other Ambulatory Visit: Payer: Self-pay | Admitting: Family Medicine

## 2016-03-06 DIAGNOSIS — E785 Hyperlipidemia, unspecified: Secondary | ICD-10-CM

## 2016-03-06 DIAGNOSIS — Z125 Encounter for screening for malignant neoplasm of prostate: Secondary | ICD-10-CM

## 2016-03-06 DIAGNOSIS — I10 Essential (primary) hypertension: Secondary | ICD-10-CM

## 2016-03-06 DIAGNOSIS — E1159 Type 2 diabetes mellitus with other circulatory complications: Secondary | ICD-10-CM

## 2016-03-06 DIAGNOSIS — IMO0002 Reserved for concepts with insufficient information to code with codable children: Secondary | ICD-10-CM

## 2016-03-06 DIAGNOSIS — E1165 Type 2 diabetes mellitus with hyperglycemia: Secondary | ICD-10-CM

## 2016-03-08 ENCOUNTER — Other Ambulatory Visit (INDEPENDENT_AMBULATORY_CARE_PROVIDER_SITE_OTHER): Payer: BLUE CROSS/BLUE SHIELD

## 2016-03-08 DIAGNOSIS — E1159 Type 2 diabetes mellitus with other circulatory complications: Secondary | ICD-10-CM

## 2016-03-08 DIAGNOSIS — E1165 Type 2 diabetes mellitus with hyperglycemia: Secondary | ICD-10-CM

## 2016-03-08 DIAGNOSIS — Z125 Encounter for screening for malignant neoplasm of prostate: Secondary | ICD-10-CM

## 2016-03-08 DIAGNOSIS — E785 Hyperlipidemia, unspecified: Secondary | ICD-10-CM

## 2016-03-08 DIAGNOSIS — IMO0002 Reserved for concepts with insufficient information to code with codable children: Secondary | ICD-10-CM

## 2016-03-08 DIAGNOSIS — I10 Essential (primary) hypertension: Secondary | ICD-10-CM | POA: Diagnosis not present

## 2016-03-08 LAB — COMPREHENSIVE METABOLIC PANEL
ALT: 48 U/L (ref 0–53)
AST: 32 U/L (ref 0–37)
Albumin: 4.5 g/dL (ref 3.5–5.2)
Alkaline Phosphatase: 48 U/L (ref 39–117)
BUN: 9 mg/dL (ref 6–23)
CO2: 29 mEq/L (ref 19–32)
Calcium: 9.9 mg/dL (ref 8.4–10.5)
Chloride: 100 mEq/L (ref 96–112)
Creatinine, Ser: 0.95 mg/dL (ref 0.40–1.50)
GFR: 86.31 mL/min (ref >60.00)
Glucose, Bld: 123 mg/dL — ABNORMAL HIGH (ref 70–99)
Potassium: 5 mEq/L (ref 3.5–5.1)
Sodium: 137 mEq/L (ref 135–145)
Total Bilirubin: 0.5 mg/dL (ref 0.2–1.2)
Total Protein: 7.1 g/dL (ref 6.0–8.3)

## 2016-03-08 LAB — LIPID PANEL
Cholesterol: 123 mg/dL (ref 0–200)
HDL: 47.9 mg/dL (ref >39.00)
LDL Cholesterol: 62 mg/dL (ref 0–99)
NonHDL: 75.34
Total CHOL/HDL Ratio: 3
Triglycerides: 65 mg/dL (ref 0.0–149.0)
VLDL: 13 mg/dL (ref 0.0–40.0)

## 2016-03-08 LAB — HEMOGLOBIN A1C: Hgb A1c MFr Bld: 6.4 % (ref 4.6–6.5)

## 2016-03-08 LAB — PSA: PSA: 0.96 ng/mL (ref 0.10–4.00)

## 2016-03-11 ENCOUNTER — Encounter: Payer: Self-pay | Admitting: *Deleted

## 2016-03-14 ENCOUNTER — Ambulatory Visit: Payer: BLUE CROSS/BLUE SHIELD | Admitting: Anesthesiology

## 2016-03-14 ENCOUNTER — Encounter: Payer: Self-pay | Admitting: *Deleted

## 2016-03-14 ENCOUNTER — Encounter
Admission: RE | Disposition: A | Discharge status: Home / Self Care | Payer: Self-pay | Source: Ambulatory Visit | Attending: Gastroenterology

## 2016-03-14 ENCOUNTER — Ambulatory Visit
Admission: RE | Admit: 2016-03-14 | Discharge: 2016-03-14 | Disposition: A | Discharge status: Home / Self Care | Payer: BLUE CROSS/BLUE SHIELD | Source: Ambulatory Visit | Attending: Gastroenterology | Admitting: Gastroenterology

## 2016-03-14 DIAGNOSIS — Z87891 Personal history of nicotine dependence: Secondary | ICD-10-CM | POA: Diagnosis not present

## 2016-03-14 DIAGNOSIS — D124 Benign neoplasm of descending colon: Secondary | ICD-10-CM | POA: Diagnosis not present

## 2016-03-14 DIAGNOSIS — R1032 Left lower quadrant pain: Secondary | ICD-10-CM | POA: Diagnosis not present

## 2016-03-14 DIAGNOSIS — Z7984 Long term (current) use of oral hypoglycemic drugs: Secondary | ICD-10-CM | POA: Insufficient documentation

## 2016-03-14 DIAGNOSIS — Z8619 Personal history of other infectious and parasitic diseases: Secondary | ICD-10-CM | POA: Insufficient documentation

## 2016-03-14 DIAGNOSIS — I10 Essential (primary) hypertension: Secondary | ICD-10-CM | POA: Diagnosis not present

## 2016-03-14 DIAGNOSIS — K5732 Diverticulitis of large intestine without perforation or abscess without bleeding: Secondary | ICD-10-CM | POA: Diagnosis not present

## 2016-03-14 DIAGNOSIS — M1990 Unspecified osteoarthritis, unspecified site: Secondary | ICD-10-CM | POA: Diagnosis not present

## 2016-03-14 DIAGNOSIS — E785 Hyperlipidemia, unspecified: Secondary | ICD-10-CM | POA: Insufficient documentation

## 2016-03-14 DIAGNOSIS — Z955 Presence of coronary angioplasty implant and graft: Secondary | ICD-10-CM | POA: Insufficient documentation

## 2016-03-14 DIAGNOSIS — Z7902 Long term (current) use of antithrombotics/antiplatelets: Secondary | ICD-10-CM | POA: Diagnosis not present

## 2016-03-14 DIAGNOSIS — I2510 Atherosclerotic heart disease of native coronary artery without angina pectoris: Secondary | ICD-10-CM | POA: Diagnosis not present

## 2016-03-14 DIAGNOSIS — E119 Type 2 diabetes mellitus without complications: Secondary | ICD-10-CM | POA: Insufficient documentation

## 2016-03-14 DIAGNOSIS — Z7982 Long term (current) use of aspirin: Secondary | ICD-10-CM | POA: Insufficient documentation

## 2016-03-14 DIAGNOSIS — R1031 Right lower quadrant pain: Secondary | ICD-10-CM | POA: Diagnosis not present

## 2016-03-14 DIAGNOSIS — K5730 Diverticulosis of large intestine without perforation or abscess without bleeding: Secondary | ICD-10-CM | POA: Diagnosis not present

## 2016-03-14 DIAGNOSIS — K219 Gastro-esophageal reflux disease without esophagitis: Secondary | ICD-10-CM | POA: Diagnosis not present

## 2016-03-14 DIAGNOSIS — Z79899 Other long term (current) drug therapy: Secondary | ICD-10-CM | POA: Insufficient documentation

## 2016-03-14 DIAGNOSIS — K635 Polyp of colon: Secondary | ICD-10-CM | POA: Diagnosis not present

## 2016-03-14 DIAGNOSIS — Z8701 Personal history of pneumonia (recurrent): Secondary | ICD-10-CM | POA: Diagnosis not present

## 2016-03-14 DIAGNOSIS — K5790 Diverticulosis of intestine, part unspecified, without perforation or abscess without bleeding: Secondary | ICD-10-CM | POA: Diagnosis not present

## 2016-03-14 HISTORY — PX: COLONOSCOPY WITH PROPOFOL: SHX5780

## 2016-03-14 HISTORY — DX: Angina pectoris, unspecified: I20.9

## 2016-03-14 LAB — GLUCOSE, CAPILLARY: Glucose-Capillary: 113 mg/dL — ABNORMAL HIGH (ref 65–99)

## 2016-03-14 SURGERY — COLONOSCOPY WITH PROPOFOL
Anesthesia: General

## 2016-03-14 MED ORDER — SODIUM CHLORIDE 0.9 % IV SOLN
INTRAVENOUS | Status: DC
  Administered 2016-03-14: 08:00:00 via INTRAVENOUS

## 2016-03-14 MED ORDER — SODIUM CHLORIDE 0.9 % IV SOLN: INTRAVENOUS | Status: DC

## 2016-03-14 MED ORDER — PHENYLEPHRINE HCL 10 MG/ML IJ SOLN
INTRAMUSCULAR | Status: DC | PRN
  Administered 2016-03-14 (×4): 100 ug via INTRAVENOUS

## 2016-03-14 MED ORDER — PROPOFOL 500 MG/50ML IV EMUL
INTRAVENOUS | Status: DC | PRN
  Administered 2016-03-14: 150 ug/kg/min via INTRAVENOUS

## 2016-03-14 MED ORDER — PROPOFOL 10 MG/ML IV BOLUS
INTRAVENOUS | Status: DC | PRN
  Administered 2016-03-14: 100 mg via INTRAVENOUS

## 2016-03-14 MED ORDER — LIDOCAINE HCL (CARDIAC) 20 MG/ML IV SOLN
INTRAVENOUS | Status: DC | PRN
  Administered 2016-03-14: 30 mg via INTRAVENOUS

## 2016-03-14 NOTE — H&P (Signed)
Outpatient short stay form Pre-procedure 03/14/2016 8:59 AM Lollie Sails MD  Primary Physician: Dr. Danise Mina, Dr. Rockey Situ  Reason for visit:  Colonoscopy  History of present illness:  Patient is a 60 year old male presenting today as above. He has a personal history of recurrent diverticulosis. This has not been recurrent pain in one location but rather times we right lower quadrant other times left lower quadrant. He has had CT evidence of diverticulitis in the left lower quadrant. He has been taking a low-dose of fiber daily. Tolerated his prep well. He does take aspirin and Plavix held the aspirin since yesterday held the Plavix for 5 days.    Current Facility-Administered Medications:  .  0.9 %  sodium chloride infusion, , Intravenous, Continuous, Lollie Sails, MD, Last Rate: 50 mL/hr at 03/14/16 0824 .  0.9 %  sodium chloride infusion, , Intravenous, Continuous, Lollie Sails, MD  Prescriptions Prior to Admission  Medication Sig Dispense Refill Last Dose  . aspirin 162 MG EC tablet Take 162 mg by mouth daily.   03/13/2016 at Unknown time  . clopidogrel (PLAVIX) 75 MG tablet take 1 tablet by mouth once daily 90 tablet 3 03/09/2016 at Unknown time  . diphenhydrAMINE (BENADRYL) 25 MG tablet Take 25 mg by mouth every 6 (six) hours as needed.     . docusate calcium (SURFAK) 240 MG capsule Take 240 mg by mouth daily.     . enalapril (VASOTEC) 20 MG tablet take 2 tablets by mouth once daily 90 tablet 3 03/14/2016 at 0400  . metFORMIN (GLUCOPHAGE) 500 MG tablet take 3 tablets by mouth once daily with BREAKFAST 90 tablet 11 03/13/2016 at Unknown time  . Multiple Vitamins-Minerals (CENTRUM SILVER) tablet Take 1 tablet by mouth daily.     03/09/2016 at Unknown time  . Coenzyme Q10 200 MG capsule Take 200 mg by mouth daily.   Taking  . cyclobenzaprine (FLEXERIL) 5 MG tablet Take 1 tablet (5 mg total) by mouth 3 (three) times daily as needed for muscle spasms. 15 tablet 0 Taking  .  dicyclomine (BENTYL) 20 MG tablet Take 1 tablet (20 mg total) by mouth 3 (three) times daily as needed for spasms. 30 tablet 0 Taking  . diphenhydrAMINE (SOMINEX) 25 MG tablet Take 25 mg by mouth at bedtime as needed.   Taking  . Docusate Calcium (STOOL SOFTENER PO) Take by mouth daily.   Taking  . hydrochlorothiazide (HYDRODIURIL) 25 MG tablet take 1 tablet by mouth once daily 90 tablet 3 Taking  . HYDROcodone-acetaminophen (NORCO) 5-325 MG tablet Take 1 tablet by mouth every 6 (six) hours as needed for moderate pain. 10 tablet 0 Taking  . nitroGLYCERIN (NITROSTAT) 0.4 MG SL tablet place 1 tablet under the tongue if needed every 5 minutes for chest pain for 3 doses IF NO RELIEF AFTER 3RD DOSE CALL PRESCRIBER OR 911. 25 tablet 6 Taking  . NON FORMULARY Vioprex 1,400 take two tablets daily for joints.   Taking  . Omega-3 Fatty Acids (FISH OIL CONCENTRATE) 1000 MG CAPS Take three tablets daily.   03/09/2016  . omeprazole (PRILOSEC) 40 MG capsule take 1 capsule by mouth once daily 30 capsule 3 Taking  . rosuvastatin (CRESTOR) 40 MG tablet take 1 tablet by mouth once daily 90 tablet 3 Taking  . sildenafil (REVATIO) 20 MG tablet Take 1 tablet (20 mg total) by mouth 3 (three) times daily as needed. (Patient not taking: Reported on 01/29/2016) 90 tablet 6 Not Taking  . traMADol (  ULTRAM) 50 MG tablet Take 1 tablet (50 mg total) by mouth every 8 (eight) hours as needed. 60 tablet 1 Taking  . Wheat Dextrin (BENEFIBER PO) Take by mouth.   Taking     No Known Allergies   Past Medical History:  Diagnosis Date  . Anginal pain ([place])   . Arthritis   . Coronary artery disease 2008   2 stents   . Diabetes type 2, controlled ([place])   . Diverticulitis 2009, 2012, 2015   recurrent episodes  . Glaucoma suspect 09/2013   Dr. Wallace Going  . History of chicken pox   . History of pneumonia 06/2015  . Hyperlipidemia   . Hypertension     Review of systems:      Physical Exam    Heart and lungs: Regular rate  and rhythm without rub or gallop, lungs are bilaterally clear.    HEENT: Normocephalic atraumatic eyes are anicteric    Other:     Pertinant exam for procedure: Soft nontender nondistended bowel sounds positive normoactive.    Planned proceedures: Colonoscopy and indicated procedures. I have discussed the risks benefits and complications of procedures to include not limited to bleeding, infection, perforation and the risk of sedation and the patient wishes to proceed.    Lollie Sails, MD Gastroenterology 03/14/2016  8:59 AM

## 2016-03-14 NOTE — Transfer of Care (Signed)
Immediate Anesthesia Transfer of Care Note  Patient: Eric [name]  Procedure(s) Performed: Procedure(s): COLONOSCOPY WITH PROPOFOL (N/A)  Patient Location: Endoscopy Unit  Anesthesia Type:General  Level of Consciousness: sedated  Airway & Oxygen Therapy: Patient Spontanous Breathing and Patient connected to nasal cannula oxygen  Post-op Assessment: Report given to RN  Post vital signs: Reviewed and stable  Last Vitals:  Vitals:   03/14/16 0803  BP: 107/69  Pulse: 71  Resp: 18  Temp: 36.2 C    Last Pain:  Vitals:   03/14/16 0803  TempSrc: Tympanic         Complications: No apparent anesthesia complications

## 2016-03-14 NOTE — Anesthesia Preprocedure Evaluation (Signed)
Anesthesia Evaluation  Patient identified by MRN, date of birth, ID band Patient awake    Reviewed: Allergy & Precautions, H&P , NPO status , Patient's Chart, lab work & pertinent test results, reviewed documented beta blocker date and time   History of Anesthesia Complications Negative for: history of anesthetic complications  Airway Mallampati: III  TM Distance: >3 FB Neck ROM: full    Dental no notable dental hx. (+) Caps   Pulmonary neg pulmonary ROS, former smoker,    Pulmonary exam normal breath sounds clear to auscultation       Cardiovascular Exercise Tolerance: Good hypertension, (-) angina+ CAD and + Cardiac Stents  (-) Past MI and (-) CABG Normal cardiovascular exam(-) dysrhythmias Supra Ventricular Tachycardia (-) Valvular Problems/Murmurs Rhythm:regular Rate:Normal     Neuro/Psych negative neurological ROS  negative psych ROS   GI/Hepatic Neg liver ROS, GERD  ,  Endo/Other  diabetes, Type 2, Oral Hypoglycemic Agents  Renal/GU negative Renal ROS  negative genitourinary   Musculoskeletal   Abdominal   Peds  Hematology negative hematology ROS (+)   Anesthesia Other Findings Past Medical History: No date: Anginal pain ([place]) No date: Arthritis 2008: Coronary artery disease     Comment: 2 stents  No date: Diabetes type 2, controlled ([place]) 2009, 2012, 2015: Diverticulitis     Comment: recurrent episodes 09/2013: Glaucoma suspect     Comment: Dr. Wallace Going No date: History of chicken pox 06/2015: History of pneumonia No date: Hyperlipidemia No date: Hypertension   Reproductive/Obstetrics negative OB ROS                             Anesthesia Physical Anesthesia Plan  ASA: III  Anesthesia Plan: General   Post-op Pain Management:    Induction:   Airway Management Planned:   Additional Equipment:   Intra-op Plan:   Post-operative Plan:   Informed Consent: I  have reviewed the patients History and Physical, chart, labs and discussed the procedure including the risks, benefits and alternatives for the proposed anesthesia with the patient or authorized representative who has indicated his/her understanding and acceptance.   Dental Advisory Given  Plan Discussed with: Anesthesiologist, CRNA and Surgeon  Anesthesia Plan Comments:         Anesthesia Quick Evaluation

## 2016-03-14 NOTE — Op Note (Signed)
[facility] Gastroenterology Patient Name: Eric [name] Procedure Date: 03/14/2016 8:56 AM MRN: [id number] Account #: [id number] Date of Birth: 02-May-1956 Admit Type: Outpatient Age: 60 Room: [facility] ENDO ROOM 4 Gender: Male Note Status: Finalized Procedure:            Colonoscopy Indications:          Lower abdominal pain, Follow-up of diverticulitis Providers:            Lollie Sails, MD Referring MD:         Ria Bush (Referring MD) Medicines:            Monitored Anesthesia Care Complications:        No immediate complications. Procedure:            Pre-Anesthesia Assessment:                       - ASA Grade Assessment: III - A patient with severe                        systemic disease.                       After obtaining informed consent, the colonoscope was                        passed under direct vision. Throughout the procedure,                        the patient's blood pressure, pulse, and oxygen                        saturations were monitored continuously. The                        Colonoscope was introduced through the anus and                        advanced to the the cecum, identified by appendiceal                        orifice and ileocecal valve. The colonoscopy was                        performed without difficulty. The patient tolerated the                        procedure well. The quality of the bowel preparation                        was fair. Findings:      Multiple small-mouthed diverticula were found in the sigmoid colon,       descending colon, transverse colon and ascending colon.      A 4 mm polyp was found in the descending colon. The polyp was sessile.       The polyp was removed with a cold snare. Resection and retrieval were       complete.      A less than 1 mm polyp was found in the distal descending colon. The       polyp was sessile. The polyp was removed with a cold biopsy forceps.  Resection  and retrieval were complete.      The retroflexed view of the distal rectum and anal verge was normal and       showed no anal or rectal abnormalities.      prominant rectal vasculature      The digital rectal exam was normal. Impression:           - Preparation of the colon was fair.                       - Diverticulosis in the sigmoid colon, in the                        descending colon, in the transverse colon and in the                        ascending colon.                       - One 4 mm polyp in the descending colon, removed with                        a cold snare. Resected and retrieved.                       - One less than 1 mm polyp in the distal descending                        colon, removed with a cold biopsy forceps. Resected and                        retrieved.                       - The distal rectum and anal verge are normal on                        retroflexion view. Recommendation:       - Discharge patient to home.                       - Telephone GI clinic for pathology results in 1 week. Procedure Code(s):    --- Professional ---                       [phone], Colonoscopy, flexible; with removal of tumor(s),                        polyp(s), or other lesion(s) by snare technique                       [id number], 63, Colonoscopy, flexible; with biopsy, single                        or multiple Diagnosis Code(s):    --- Professional ---                       D12.4, Benign neoplasm of descending colon                       R10.30, Lower abdominal pain, unspecified  K57.32, Diverticulitis of large intestine without                        perforation or abscess without bleeding                       K57.30, Diverticulosis of large intestine without                        perforation or abscess without bleeding CPT copyright 2016 American Medical Association. All rights reserved. The codes documented in this report are preliminary and upon coder  review may  be revised to meet current compliance requirements. Lollie Sails, MD 03/14/2016 9:43:34 AM This report has been signed electronically. Number of Addenda: 0 Note Initiated On: 03/14/2016 8:56 AM Scope Withdrawal Time: 0 hours 5 minutes 32 seconds  Total Procedure Duration: 0 hours 21 minutes 38 seconds       [facility]

## 2016-03-14 NOTE — Anesthesia Postprocedure Evaluation (Signed)
Anesthesia Post Note  Patient: Eric [name]  Procedure(s) Performed: Procedure(s) (LRB): COLONOSCOPY WITH PROPOFOL (N/A)  Patient location during evaluation: Endoscopy Anesthesia Type: General Level of consciousness: awake and alert Pain management: pain level controlled Vital Signs Assessment: post-procedure vital signs reviewed and stable Respiratory status: spontaneous breathing, nonlabored ventilation, respiratory function stable and patient connected to nasal cannula oxygen Cardiovascular status: blood pressure returned to baseline and stable Postop Assessment: no signs of nausea or vomiting Anesthetic complications: no    Last Vitals:  Vitals:   03/14/16 1000 03/14/16 1010  BP: 124/76 135/75  Pulse: 78 72  Resp: (!) 23 17  Temp:      Last Pain:  Vitals:   03/14/16 0942  TempSrc: Tympanic                 Martha Clan

## 2016-03-15 ENCOUNTER — Encounter: Payer: Self-pay | Admitting: Gastroenterology

## 2016-03-15 ENCOUNTER — Other Ambulatory Visit: Payer: BLUE CROSS/BLUE SHIELD

## 2016-03-15 LAB — SURGICAL PATHOLOGY

## 2016-03-16 ENCOUNTER — Encounter: Payer: Self-pay | Admitting: Family Medicine

## 2016-03-18 ENCOUNTER — Encounter: Payer: BLUE CROSS/BLUE SHIELD | Admitting: Family Medicine

## 2016-03-22 ENCOUNTER — Ambulatory Visit (INDEPENDENT_AMBULATORY_CARE_PROVIDER_SITE_OTHER): Payer: BLUE CROSS/BLUE SHIELD | Admitting: Family Medicine

## 2016-03-22 ENCOUNTER — Encounter: Payer: Self-pay | Admitting: Family Medicine

## 2016-03-22 VITALS — BP 134/78 | HR 84 | Temp 97.5°F | Ht 69.0 in | Wt 236.8 lb

## 2016-03-22 DIAGNOSIS — Z23 Encounter for immunization: Secondary | ICD-10-CM | POA: Diagnosis not present

## 2016-03-22 DIAGNOSIS — Z0000 Encounter for general adult medical examination without abnormal findings: Secondary | ICD-10-CM

## 2016-03-22 DIAGNOSIS — I2510 Atherosclerotic heart disease of native coronary artery without angina pectoris: Secondary | ICD-10-CM

## 2016-03-22 DIAGNOSIS — E785 Hyperlipidemia, unspecified: Secondary | ICD-10-CM

## 2016-03-22 DIAGNOSIS — E118 Type 2 diabetes mellitus with unspecified complications: Secondary | ICD-10-CM

## 2016-03-22 DIAGNOSIS — I10 Essential (primary) hypertension: Secondary | ICD-10-CM

## 2016-03-22 DIAGNOSIS — E669 Obesity, unspecified: Secondary | ICD-10-CM | POA: Diagnosis not present

## 2016-03-22 NOTE — Patient Instructions (Addendum)
Flu shot today You are doing well today, labs looking good.  Continue current medicines. Return as needed or in 6 months for diabetes check.  Work on incorporating regular exercise into routine. AHA goal for exercise is 150 min/ wk moderate intensity aerobic exercise.   Health Maintenance, Male A healthy lifestyle and preventative care can promote health and wellness.  Maintain regular health, dental, and eye exams.  Eat a healthy diet. Foods like vegetables, fruits, whole grains, low-fat dairy products, and lean protein foods contain the nutrients you need and are low in calories. Decrease your intake of foods high in solid fats, added sugars, and salt. Get information about a proper diet from your health care provider, if necessary.  Regular physical exercise is one of the most important things you can do for your health. Most adults should get at least 150 minutes of moderate-intensity exercise (any activity that increases your heart rate and causes you to sweat) each week. In addition, most adults need muscle-strengthening exercises on 2 or more days a week.   Maintain a healthy weight. The body mass index (BMI) is a screening tool to identify possible weight problems. It provides an estimate of body fat based on height and weight. Your health care provider can find your BMI and can help you achieve or maintain a healthy weight. For males 20 years and older:  A BMI below 18.5 is considered underweight.  A BMI of 18.5 to 24.9 is normal.  A BMI of 25 to 29.9 is considered overweight.  A BMI of 30 and above is considered obese.  Maintain normal blood lipids and cholesterol by exercising and minimizing your intake of saturated fat. Eat a balanced diet with plenty of fruits and vegetables. Blood tests for lipids and cholesterol should begin at age 56 and be repeated every 5 years. If your lipid or cholesterol levels are high, you are over age 55, or you are at high risk for heart disease,  you may need your cholesterol levels checked more frequently.Ongoing high lipid and cholesterol levels should be treated with medicines if diet and exercise are not working.  If you smoke, find out from your health care provider how to quit. If you do not use tobacco, do not start.  Lung cancer screening is recommended for adults aged 63-80 years who are at high risk for developing lung cancer because of a history of smoking. A yearly low-dose CT scan of the lungs is recommended for people who have at least a 30-pack-year history of smoking and are current smokers or have quit within the past 15 years. A pack year of smoking is smoking an average of 1 pack of cigarettes a day for 1 year (for example, a 30-pack-year history of smoking could mean smoking 1 pack a day for 30 years or 2 packs a day for 15 years). Yearly screening should continue until the smoker has stopped smoking for at least 15 years. Yearly screening should be stopped for people who develop a health problem that would prevent them from having lung cancer treatment.  If you choose to drink alcohol, do not have more than 2 drinks per day. One drink is considered to be 12 oz (360 mL) of beer, 5 oz (150 mL) of wine, or 1.5 oz (45 mL) of liquor.  Avoid the use of street drugs. Do not share needles with anyone. Ask for help if you need support or instructions about stopping the use of drugs.  High blood pressure causes  heart disease and increases the risk of stroke. High blood pressure is more likely to develop in:  People who have blood pressure in the end of the normal range (100-139/85-89 mm Hg).  People who are overweight or obese.  People who are African American.  If you are 20-73 years of age, have your blood pressure checked every 3-5 years. If you are 87 years of age or older, have your blood pressure checked every year. You should have your blood pressure measured twice--once when you are at a hospital or clinic, and once when  you are not at a hospital or clinic. Record the average of the two measurements. To check your blood pressure when you are not at a hospital or clinic, you can use:  An automated blood pressure machine at a pharmacy.  A home blood pressure monitor.  If you are 67-90 years old, ask your health care provider if you should take aspirin to prevent heart disease.  Diabetes screening involves taking a blood sample to check your fasting blood sugar level. This should be done once every 3 years after age 28 if you are at a normal weight and without risk factors for diabetes. Testing should be considered at a younger age or be carried out more frequently if you are overweight and have at least 1 risk factor for diabetes.  Colorectal cancer can be detected and often prevented. Most routine colorectal cancer screening begins at the age of 69 and continues through age 13. However, your health care provider may recommend screening at an earlier age if you have risk factors for colon cancer. On a yearly basis, your health care provider may provide home test kits to check for hidden blood in the stool. A small camera at the end of a tube may be used to directly examine the colon (sigmoidoscopy or colonoscopy) to detect the earliest forms of colorectal cancer. Talk to your health care provider about this at age 54 when routine screening begins. A direct exam of the colon should be repeated every 5-10 years through age 28, unless early forms of precancerous polyps or small growths are found.  People who are at an increased risk for hepatitis B should be screened for this virus. You are considered at high risk for hepatitis B if:  You were born in a country where hepatitis B occurs often. Talk with your health care provider about which countries are considered high risk.  Your parents were born in a high-risk country and you have not received a shot to protect against hepatitis B (hepatitis B vaccine).  You have HIV  or AIDS.  You use needles to inject street drugs.  You live with, or have sex with, someone who has hepatitis B.  You are a man who has sex with other men (MSM).  You get hemodialysis treatment.  You take certain medicines for conditions like cancer, organ transplantation, and autoimmune conditions.  Hepatitis C blood testing is recommended for all people born from [birth year] through 1964 and any individual with known risk factors for hepatitis C.  Healthy men should no longer receive prostate-specific antigen (PSA) blood tests as part of routine cancer screening. Talk to your health care provider about prostate cancer screening.  Testicular cancer screening is not recommended for adolescents or adult males who have no symptoms. Screening includes self-exam, a health care provider exam, and other screening tests. Consult with your health care provider about any symptoms you have or any concerns you have about  testicular cancer.  Practice safe sex. Use condoms and avoid high-risk sexual practices to reduce the spread of sexually transmitted infections (STIs).  You should be screened for STIs, including gonorrhea and chlamydia if:  You are sexually active and are younger than 24 years.  You are older than 24 years, and your health care provider tells you that you are at risk for this type of infection.  Your sexual activity has changed since you were last screened, and you are at an increased risk for chlamydia or gonorrhea. Ask your health care provider if you are at risk.  If you are at risk of being infected with HIV, it is recommended that you take a prescription medicine daily to prevent HIV infection. This is called pre-exposure prophylaxis (PrEP). You are considered at risk if:  You are a man who has sex with other men (MSM).  You are a heterosexual man who is sexually active with multiple partners.  You take drugs by injection.  You are sexually active with a partner who has  HIV.  Talk with your health care provider about whether you are at high risk of being infected with HIV. If you choose to begin PrEP, you should first be tested for HIV. You should then be tested every 3 months for as long as you are taking PrEP.  Use sunscreen. Apply sunscreen liberally and repeatedly throughout the day. You should seek shade when your shadow is shorter than you. Protect yourself by wearing long sleeves, pants, a wide-brimmed hat, and sunglasses year round whenever you are outdoors.  Tell your health care provider of new moles or changes in moles, especially if there is a change in shape or color. Also, tell your health care provider if a mole is larger than the size of a pencil eraser.  A one-time screening for abdominal aortic aneurysm (AAA) and surgical repair of large AAAs by ultrasound is recommended for men aged 41-75 years who are current or former smokers.  Stay current with your vaccines (immunizations).   This information is not intended to replace advice given to you by your health care provider. Make sure you discuss any questions you have with your health care provider.   Document Released: 01/07/2008 Document Revised: 08/01/2014 Document Reviewed: 12/06/2010 Elsevier Interactive Patient Education [employer].

## 2016-03-22 NOTE — Assessment & Plan Note (Signed)
Chronic, stable. Continue current regimen. Pt desires to come in every 6 months for close f/u.

## 2016-03-22 NOTE — Progress Notes (Signed)
BP 134/78   Pulse 84   Temp 97.5 F (36.4 C) (Oral)   Ht 5\' 9"  (1.753 m)   Wt 236 lb 12 oz (107.4 kg)   BMI 34.96 kg/m    CC: CPE Subjective:    Patient ID: Eric [name], male    DOB: 1955/12/02, 60 y.o.   MRN: [id number]  HPI: Eric [name] is a 60 y.o. male presenting on 03/22/2016 for Annual Exam   Preventative: COLONOSCOPY WITH PROPOFOL; 03/14/2016; TAx1, diverticulosis, rpt 5 yrs; Lollie Sails, MD Prostate cancer screening - has had normal in past. Screened 2015 and then readdress in 3 years.  Flu shot yearly  Pneumovax 03/10/2014 [facility]) Tetanus 2007, Tdap 2016 zostavax 07/2013 Seat belt use discussed Sunscreen use discussed, denies changing moles Ex smoker Alcohol - averages 6 pack/wk. No hangover.   Lives with brother  Occupation: truck Geophysicist/field seismologist, stocks stores  Activity: no regular exercise  Diet: good water, fruits/vegetables some - healthier eating choices   Relevant past medical, surgical, family and social history reviewed and updated as indicated. Interim medical history since our last visit reviewed. Allergies and medications reviewed and updated. Current Outpatient Prescriptions on File Prior to Visit  Medication Sig  . aspirin 162 MG EC tablet Take 162 mg by mouth daily.  . clopidogrel (PLAVIX) 75 MG tablet take 1 tablet by mouth once daily  . Coenzyme Q10 200 MG capsule Take 200 mg by mouth daily.  . cyclobenzaprine (FLEXERIL) 5 MG tablet Take 1 tablet (5 mg total) by mouth 3 (three) times daily as needed for muscle spasms.  [name] dicyclomine (BENTYL) 20 MG tablet Take 1 tablet (20 mg total) by mouth 3 (three) times daily as needed for spasms.  . diphenhydrAMINE (BENADRYL) 25 MG tablet Take 25 mg by mouth every 6 (six) hours as needed.  [name] Calcium (STOOL SOFTENER PO) Take by mouth daily.  . enalapril (VASOTEC) 20 MG tablet take 2 tablets by mouth once daily  . hydrochlorothiazide (HYDRODIURIL) 25 MG tablet take 1 tablet by mouth  once daily  . HYDROcodone-acetaminophen (NORCO) 5-325 MG tablet Take 1 tablet by mouth every 6 (six) hours as needed for moderate pain.  . metFORMIN (GLUCOPHAGE) 500 MG tablet take 3 tablets by mouth once daily with BREAKFAST  . Multiple Vitamins-Minerals (CENTRUM SILVER) tablet Take 1 tablet by mouth daily.    . nitroGLYCERIN (NITROSTAT) 0.4 MG SL tablet place 1 tablet under the tongue if needed every 5 minutes for chest pain for 3 doses IF NO RELIEF AFTER 3RD DOSE CALL PRESCRIBER OR 911.  . NON FORMULARY Vioprex 1,400 take two tablets daily for joints.  . Omega-3 Fatty Acids (FISH OIL CONCENTRATE) 1000 MG CAPS Take three tablets daily.  [name] omeprazole (PRILOSEC) 40 MG capsule take 1 capsule by mouth once daily  . rosuvastatin (CRESTOR) 40 MG tablet take 1 tablet by mouth once daily  . sildenafil (REVATIO) 20 MG tablet Take 1 tablet (20 mg total) by mouth 3 (three) times daily as needed. (Patient not taking: Reported on 03/22/2016)   No current facility-administered medications on file prior to visit.     Review of Systems  Constitutional: Negative for activity change, appetite change, chills, fatigue, fever and unexpected weight change.  HENT: Negative for hearing loss.   Eyes: Negative for visual disturbance.  Respiratory: Negative for cough, chest tightness, shortness of breath and wheezing.   Cardiovascular: Negative for chest pain, palpitations and leg swelling.  Gastrointestinal: Negative for abdominal distention,  abdominal pain, blood in stool, constipation, diarrhea, nausea and vomiting.  Genitourinary: Negative for difficulty urinating and hematuria.  Musculoskeletal: Negative for arthralgias, myalgias and neck pain.  Skin: Negative for rash.  Neurological: Negative for dizziness, seizures, syncope and headaches.  Hematological: Negative for adenopathy. Bruises/bleeds easily (aspirin/plavix).  Psychiatric/Behavioral: Negative for dysphoric mood. The patient is not nervous/anxious.      Per HPI unless specifically indicated in ROS section     Objective:    BP 134/78   Pulse 84   Temp 97.5 F (36.4 C) (Oral)   Ht 5\' 9"  (1.753 m)   Wt 236 lb 12 oz (107.4 kg)   BMI 34.96 kg/m   Wt Readings from Last 3 Encounters:  03/22/16 236 lb 12 oz (107.4 kg)  03/14/16 235 lb (106.6 kg)  01/29/16 237 lb 8 oz (107.7 kg)    Physical Exam  Constitutional: He is oriented to person, place, and time. He appears well-developed and well-nourished. No distress.  HENT:  Head: Normocephalic and atraumatic.  [name] Ear: Hearing, tympanic membrane, external ear and ear canal normal.  Left Ear: Hearing, tympanic membrane, external ear and ear canal normal.  Nose: Nose normal.  Mouth/Throat: Uvula is midline, oropharynx is clear and moist and mucous membranes are normal. No oropharyngeal exudate, posterior oropharyngeal edema or posterior oropharyngeal erythema.  Eyes: Conjunctivae and EOM are normal. Pupils are equal, round, and reactive to light. No scleral icterus.  Neck: Normal range of motion. Neck supple. No thyromegaly present.  Cardiovascular: Normal rate, regular rhythm, normal heart sounds and intact distal pulses.   No murmur heard. Pulses:      Radial pulses are 2+ on the [name] side, and 2+ on the left side.  Pulmonary/Chest: Effort normal and breath sounds normal. No respiratory distress. He has no wheezes. He has no rales.  Abdominal: Soft. Bowel sounds are normal. He exhibits no distension and no mass. There is no tenderness. There is no rebound and no guarding.  Musculoskeletal: Normal range of motion. He exhibits no edema.  Lymphadenopathy:    He has no cervical adenopathy.  Neurological: He is alert and oriented to person, place, and time.  CN grossly intact, station and gait intact  Skin: Skin is warm and dry. No rash noted.  Psychiatric: He has a normal mood and affect. His behavior is normal. Judgment and thought content normal.  Nursing note and vitals  reviewed.  Lab Results  Component Value Date   HGBA1C 6.4 03/08/2016    Lab Results  Component Value Date   PSA 0.96 03/08/2016   PSA 0.66 03/10/2015   PSA 0.74 03/04/2014       Assessment & Plan:   Problem List Items Addressed This Visit    CAD, NATIVE VESSEL    Asxs. Appreciate cards care.      Diabetes mellitus type 2, controlled, with complications (HCC)    Chronic, stable. Continue current regimen. Pt desires to come in every 6 months for close f/u.      Health maintenance examination    Preventative protocols reviewed and updated unless pt declined. Discussed healthy diet and lifestyle.       Hyperlipidemia    Chronic, stable, well controlled. Continue crestor and fish oil.      HYPERTENSION, BENIGN    Chronic, stable. Continue current regimen.      Obesity, Class I, BMI 30-34.9    Discussed healthy diet and lifestyle changes to affect sustainable weight loss.  Other Visit Diagnoses    Need for influenza vaccination    -  Primary   Relevant Orders   Flu Vaccine QUAD 36+ mos PF IM (Fluarix & Fluzone Quad PF) (Completed)       Follow up plan: Return in about 6 months (around 09/21/2016), or as needed, for follow up visit.  Ria Bush, MD

## 2016-03-22 NOTE — Progress Notes (Signed)
Pre visit review using our clinic review tool, if applicable. No additional management support is needed unless otherwise documented below in the visit note. 

## 2016-03-22 NOTE — Assessment & Plan Note (Signed)
Preventative protocols reviewed and updated unless pt declined. Discussed healthy diet and lifestyle.  

## 2016-03-22 NOTE — Assessment & Plan Note (Addendum)
Chronic, stable, well controlled. Continue crestor and fish oil.

## 2016-03-22 NOTE — Assessment & Plan Note (Addendum)
Discussed healthy diet and lifestyle changes to affect sustainable weight loss  

## 2016-03-22 NOTE — Assessment & Plan Note (Signed)
Chronic, stable. Continue current regimen. 

## 2016-03-22 NOTE — Assessment & Plan Note (Signed)
Asxs. Appreciate cards care.

## 2016-04-12 DIAGNOSIS — H40053 Ocular hypertension, bilateral: Secondary | ICD-10-CM | POA: Diagnosis not present

## 2016-04-19 DIAGNOSIS — H40003 Preglaucoma, unspecified, bilateral: Secondary | ICD-10-CM | POA: Diagnosis not present

## 2016-04-19 LAB — HM DIABETES EYE EXAM

## 2016-04-20 ENCOUNTER — Other Ambulatory Visit: Payer: Self-pay | Admitting: Family Medicine

## 2016-04-21 ENCOUNTER — Encounter: Payer: Self-pay | Admitting: Family Medicine

## 2016-05-20 ENCOUNTER — Other Ambulatory Visit: Payer: Self-pay | Admitting: Cardiovascular Disease

## 2016-05-25 DIAGNOSIS — H6500 Acute serous otitis media, unspecified ear: Secondary | ICD-10-CM | POA: Diagnosis not present

## 2016-05-25 DIAGNOSIS — J069 Acute upper respiratory infection, unspecified: Secondary | ICD-10-CM | POA: Diagnosis not present

## 2016-06-03 DIAGNOSIS — M7542 Impingement syndrome of left shoulder: Secondary | ICD-10-CM | POA: Diagnosis not present

## 2016-06-03 DIAGNOSIS — M7541 Impingement syndrome of right shoulder: Secondary | ICD-10-CM | POA: Diagnosis not present

## 2016-06-19 ENCOUNTER — Other Ambulatory Visit: Payer: Self-pay | Admitting: Cardiovascular Disease

## 2016-06-28 DIAGNOSIS — J069 Acute upper respiratory infection, unspecified: Secondary | ICD-10-CM | POA: Diagnosis not present

## 2016-07-05 ENCOUNTER — Ambulatory Visit (INDEPENDENT_AMBULATORY_CARE_PROVIDER_SITE_OTHER): Payer: BLUE CROSS/BLUE SHIELD | Admitting: Cardiovascular Disease

## 2016-07-05 ENCOUNTER — Encounter: Payer: Self-pay | Admitting: Cardiovascular Disease

## 2016-07-05 VITALS — BP 130/82 | HR 72 | Ht 68.5 in | Wt 243.0 lb

## 2016-07-05 DIAGNOSIS — I10 Essential (primary) hypertension: Secondary | ICD-10-CM | POA: Diagnosis not present

## 2016-07-05 DIAGNOSIS — E118 Type 2 diabetes mellitus with unspecified complications: Secondary | ICD-10-CM

## 2016-07-05 DIAGNOSIS — E782 Mixed hyperlipidemia: Secondary | ICD-10-CM | POA: Diagnosis not present

## 2016-07-05 DIAGNOSIS — I2510 Atherosclerotic heart disease of native coronary artery without angina pectoris: Secondary | ICD-10-CM | POA: Diagnosis not present

## 2016-07-05 DIAGNOSIS — E669 Obesity, unspecified: Secondary | ICD-10-CM

## 2016-07-05 MED ORDER — CYCLOBENZAPRINE HCL 10 MG PO TABS: 10.0000 mg | ORAL_TABLET | Freq: Three times a day (TID) | ORAL | 1 refills | Status: DC | PRN

## 2016-07-05 MED ORDER — SILDENAFIL CITRATE 20 MG PO TABS: 20.0000 mg | ORAL_TABLET | Freq: Three times a day (TID) | ORAL | 6 refills | Status: DC | PRN

## 2016-07-05 MED ORDER — TRAMADOL HCL 50 MG PO TABS: 50.0000 mg | ORAL_TABLET | Freq: Four times a day (QID) | ORAL | 1 refills | Status: DC | PRN
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
## 2016-07-05 NOTE — Patient Instructions (Signed)

## 2016-07-26 DIAGNOSIS — E119 Type 2 diabetes mellitus without complications: Secondary | ICD-10-CM | POA: Diagnosis not present

## 2016-07-26 DIAGNOSIS — J019 Acute sinusitis, unspecified: Secondary | ICD-10-CM | POA: Diagnosis not present

## 2016-09-05 ENCOUNTER — Other Ambulatory Visit: Payer: Self-pay | Admitting: Family Medicine

## 2016-09-05 DIAGNOSIS — E118 Type 2 diabetes mellitus with unspecified complications: Secondary | ICD-10-CM

## 2016-09-13 ENCOUNTER — Other Ambulatory Visit (INDEPENDENT_AMBULATORY_CARE_PROVIDER_SITE_OTHER): Payer: BLUE CROSS/BLUE SHIELD

## 2016-09-13 DIAGNOSIS — E118 Type 2 diabetes mellitus with unspecified complications: Secondary | ICD-10-CM

## 2016-09-13 LAB — BASIC METABOLIC PANEL
BUN: 9 mg/dL (ref 6–23)
CO2: 31 mEq/L (ref 19–32)
Calcium: 9.6 mg/dL (ref 8.4–10.5)
Chloride: 103 mEq/L (ref 96–112)
Creatinine, Ser: 0.89 mg/dL (ref 0.40–1.50)
GFR: 92.89 mL/min (ref >60.00)
Glucose, Bld: 127 mg/dL — ABNORMAL HIGH (ref 70–99)
Potassium: 5 mEq/L (ref 3.5–5.1)
Sodium: 137 mEq/L (ref 135–145)

## 2016-09-20 ENCOUNTER — Ambulatory Visit (INDEPENDENT_AMBULATORY_CARE_PROVIDER_SITE_OTHER): Payer: BLUE CROSS/BLUE SHIELD | Admitting: Family Medicine

## 2016-09-20 ENCOUNTER — Encounter: Payer: Self-pay | Admitting: Family Medicine

## 2016-09-20 VITALS — BP 154/90 | HR 76 | Temp 97.6°F | Wt 251.5 lb

## 2016-09-20 DIAGNOSIS — I10 Essential (primary) hypertension: Secondary | ICD-10-CM

## 2016-09-20 DIAGNOSIS — E118 Type 2 diabetes mellitus with unspecified complications: Secondary | ICD-10-CM | POA: Diagnosis not present

## 2016-09-20 DIAGNOSIS — E669 Obesity, unspecified: Secondary | ICD-10-CM | POA: Diagnosis not present

## 2016-09-20 NOTE — Assessment & Plan Note (Signed)
Chronic, stable. Continue current regimen. Pt requests Q24mo visit. Foot exam today.

## 2016-09-20 NOTE — Assessment & Plan Note (Signed)
Chronic, elevated today. Pt attributes to last night's dinner with alcohol. Good control on recent office visits and with home readings. No changes today. Recheck next visit. Pt agrees with plan.

## 2016-09-20 NOTE — Assessment & Plan Note (Signed)
Weight gain noted. Discussed with patient.

## 2016-09-20 NOTE — Progress Notes (Signed)
Pre visit review using our clinic review tool, if applicable. No additional management support is needed unless otherwise documented below in the visit note. 

## 2016-09-20 NOTE — Progress Notes (Signed)
BP (!) 154/90   Pulse 76   Temp 97.6 F (36.4 C) (Oral)   Wt 251 lb 8 oz (114.1 kg)   BMI 37.68 kg/m    CC: DM f/u visit Subjective:    Patient ID: Eric [name], male    DOB: 02/19/1956, 61 y.o.   MRN: [id number]  HPI: Eric [name] is a 61 y.o. male presenting on 09/20/2016 for Follow-up   HTN - Compliant with current antihypertensive regimen of HCTZ 25mg  daily and enalapril 40mg  daily. Attributes elevated reading to [place] last night. Does check blood pressures at home: well controlled. No low blood pressure readings or symptoms of dizziness/syncope. Denies HA, vision changes, CP/tightness, SOB, leg swelling.  DM - regularly does not check sugars. Compliant with antihyperglycemic regimen which includes: metformin 1500mg  once daily. Denies low sugars or hypoglycemic symptoms.  Denies paresthesias. Last diabetic eye exam 03/2016.  Pneumovax: 2015.  Prevnar: not due. A1c 07/2016 6.4%.  Lab Results  Component Value Date   HGBA1C 6.4 03/08/2016   Diabetic Foot Exam - Simple   Simple Foot Form Diabetic Foot exam was performed with the following findings:  Yes 09/20/2016 11:49 AM  Visual Inspection No deformities, no ulcerations, no other skin breakdown bilaterally:  Yes Sensation Testing Intact to touch and monofilament testing bilaterally:  Yes Pulse Check Posterior Tibialis and Dorsalis pulse intact bilaterally:  Yes Comments      Relevant past medical, surgical, family and social history reviewed and updated as indicated. Interim medical history since our last visit reviewed. Allergies and medications reviewed and updated.  Outpatient Medications Prior to Visit  Medication Sig Dispense Refill  . aspirin 162 MG EC tablet Take 162 mg by mouth daily.    . clopidogrel (PLAVIX) 75 MG tablet take 1 tablet by mouth once daily 90 tablet 3  . Coenzyme Q10 200 MG capsule Take 200 mg by mouth daily.    . cyclobenzaprine (FLEXERIL) 10 MG tablet Take 1 tablet (10 mg total) by  mouth 3 (three) times daily as needed for muscle spasms. 30 tablet 1  . dicyclomine (BENTYL) 20 MG tablet Take 1 tablet (20 mg total) by mouth 3 (three) times daily as needed for spasms. 30 tablet 0  . diphenhydrAMINE (BENADRYL) 25 MG tablet Take 25 mg by mouth every 6 (six) hours as needed.    [name] Calcium (STOOL SOFTENER PO) Take by mouth daily.    . enalapril (VASOTEC) 20 MG tablet take 2 tablets by mouth once daily 60 tablet 3  . hydrochlorothiazide (HYDRODIURIL) 25 MG tablet take 1 tablet by mouth once daily 90 tablet 3  . HYDROcodone-acetaminophen (NORCO) 5-325 MG tablet Take 1 tablet by mouth every 6 (six) hours as needed for moderate pain. 10 tablet 0  . metFORMIN (GLUCOPHAGE) 500 MG tablet take 3 tablets by mouth once daily with BREAKFAST 90 tablet 11  . Methylcellulose, Laxative, (CITRUCEL PO) Take by mouth. As needed    . Multiple Vitamins-Minerals (CENTRUM SILVER) tablet Take 1 tablet by mouth daily.      . nitroGLYCERIN (NITROSTAT) 0.4 MG SL tablet place 1 tablet under the tongue if needed every 5 minutes for chest pain for 3 doses IF NO RELIEF AFTER 3RD DOSE CALL PRESCRIBER OR 911. 25 tablet 6  . NON FORMULARY Vioprex 1,400 take two tablets daily for joints.    . Omega-3 Fatty Acids (FISH OIL CONCENTRATE) 1000 MG CAPS Take three tablets daily.    [name] omeprazole (PRILOSEC) 40 MG capsule take  1 capsule by mouth once daily 30 capsule 6  . rosuvastatin (CRESTOR) 40 MG tablet take 1 tablet by mouth once daily 90 tablet 3  . sildenafil (REVATIO) 20 MG tablet Take 1 tablet (20 mg total) by mouth 3 (three) times daily as needed. 90 tablet 6  . traMADol (ULTRAM) 50 MG tablet Take 1 tablet (50 mg total) by mouth every 6 (six) hours as needed. 60 tablet 1   No facility-administered medications prior to visit.      Per HPI unless specifically indicated in ROS section below Review of Systems     Objective:    BP (!) 154/90   Pulse 76   Temp 97.6 F (36.4 C) (Oral)   Wt 251 lb 8 oz  (114.1 kg)   BMI 37.68 kg/m   Wt Readings from Last 3 Encounters:  09/20/16 251 lb 8 oz (114.1 kg)  07/05/16 243 lb (110.2 kg)  03/22/16 236 lb 12 oz (107.4 kg)    Physical Exam  Constitutional: He appears well-developed and well-nourished. No distress.  HENT:  Head: Normocephalic and atraumatic.  Right Ear: External ear normal.  Left Ear: External ear normal.  Nose: Nose normal.  Mouth/Throat: Oropharynx is clear and moist. No oropharyngeal exudate.  Eyes: Conjunctivae and EOM are normal. Pupils are equal, round, and reactive to light. No scleral icterus.  Neck: Normal range of motion. Neck supple.  Cardiovascular: Normal rate, regular rhythm, normal heart sounds and intact distal pulses.   No murmur heard. Pulmonary/Chest: Effort normal and breath sounds normal. No respiratory distress. He has no wheezes. He has no rales.  Musculoskeletal: He exhibits no edema.  See HPI for foot exam if done  Lymphadenopathy:    He has no cervical adenopathy.  Skin: Skin is warm and dry. No rash noted.  Psychiatric: He has a normal mood and affect.  Nursing note and vitals reviewed.  Results for orders placed or performed in visit on [id number]  Basic metabolic panel  Result Value Ref Range   Sodium 137 135 - 145 mEq/L   Potassium 5.0 3.5 - 5.1 mEq/L   Chloride 103 96 - 112 mEq/L   CO2 31 19 - 32 mEq/L   Glucose, Bld 127 (H) 70 - 99 mg/dL   BUN 9 6 - 23 mg/dL   Creatinine, Ser 0.89 0.40 - 1.50 mg/dL   Calcium 9.6 8.4 - 10.5 mg/dL   GFR 92.89 >60.00 mL/min      Assessment & Plan:   Problem List Items Addressed This Visit    Diabetes mellitus type 2, controlled, with complications ([place]) - Primary    Chronic, stable. Continue current regimen. Pt requests Q41mo visit. Foot exam today.      HYPERTENSION, BENIGN    Chronic, elevated today. Pt attributes to last night's dinner with alcohol. Good control on recent office visits and with home readings. No changes today. Recheck next visit. Pt  agrees with plan.      Obesity, Class I, BMI 30-34.9    Weight gain noted. Discussed with patient.           Follow up plan: Return in about 6 months (around 03/20/2017) for annual exam, prior fasting for blood work.  Ria Bush, MD

## 2016-09-20 NOTE — Patient Instructions (Signed)
Continue current medicines. You are doing well today Return in 6 months for physical.

## 2016-09-24 ENCOUNTER — Other Ambulatory Visit: Payer: Self-pay | Admitting: Cardiovascular Disease

## 2016-10-18 DIAGNOSIS — H40053 Ocular hypertension, bilateral: Secondary | ICD-10-CM | POA: Diagnosis not present

## 2016-11-02 ENCOUNTER — Other Ambulatory Visit: Payer: Self-pay | Admitting: Cardiovascular Disease

## 2016-11-03 ENCOUNTER — Telehealth: Payer: Self-pay | Admitting: Cardiovascular Disease

## 2016-11-03 ENCOUNTER — Other Ambulatory Visit: Payer: Self-pay | Admitting: Cardiovascular Disease

## 2016-11-03 NOTE — Telephone Encounter (Signed)
Please advise if okay to refill the following medications:  Cipro and Metronidazole.  Please contact the patient regarding the medications if refuses medication refill.

## 2016-11-03 NOTE — Telephone Encounter (Signed)
Spoke with patient.  He says Dr Rockey Situ always prescribes him the antibiotic to keep on hand for diverticulitis when it flares up. Patient is not having any issues at this time. Will route to Dr Rockey Situ to confirm ok to send in the refill.

## 2016-11-03 NOTE — Telephone Encounter (Signed)
Pharmacy calling asking about medications Ciprofloxain and Metronidazole  And why we are denying them Please advise

## 2016-11-04 ENCOUNTER — Other Ambulatory Visit: Payer: Self-pay | Admitting: Cardiovascular Disease

## 2016-11-04 MED ORDER — METRONIDAZOLE 500 MG PO TABS: 500.0000 mg | ORAL_TABLET | Freq: Three times a day (TID) | ORAL | 1 refills | Status: DC

## 2016-11-04 MED ORDER — CIPROFLOXACIN HCL 500 MG PO TABS: 500.0000 mg | ORAL_TABLET | Freq: Two times a day (BID) | ORAL | 1 refills | Status: DC

## 2016-11-04 NOTE — Telephone Encounter (Signed)
Sent in to pharmacy.  

## 2016-11-07 DIAGNOSIS — T7840XA Allergy, unspecified, initial encounter: Secondary | ICD-10-CM | POA: Diagnosis not present

## 2016-11-22 ENCOUNTER — Other Ambulatory Visit: Payer: Self-pay | Admitting: Cardiovascular Disease

## 2016-12-21 ENCOUNTER — Other Ambulatory Visit: Payer: Self-pay | Admitting: Cardiovascular Disease
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
## 2016-12-27 ENCOUNTER — Encounter: Payer: Self-pay | Admitting: Cardiovascular Disease

## 2016-12-27 ENCOUNTER — Ambulatory Visit (INDEPENDENT_AMBULATORY_CARE_PROVIDER_SITE_OTHER): Payer: BLUE CROSS/BLUE SHIELD | Admitting: Cardiovascular Disease

## 2016-12-27 VITALS — BP 114/70 | HR 71 | Ht 69.0 in | Wt 248.0 lb

## 2016-12-27 DIAGNOSIS — I10 Essential (primary) hypertension: Secondary | ICD-10-CM

## 2016-12-27 DIAGNOSIS — E118 Type 2 diabetes mellitus with unspecified complications: Secondary | ICD-10-CM

## 2016-12-27 DIAGNOSIS — E782 Mixed hyperlipidemia: Secondary | ICD-10-CM | POA: Diagnosis not present

## 2016-12-27 DIAGNOSIS — I25118 Atherosclerotic heart disease of native coronary artery with other forms of angina pectoris: Secondary | ICD-10-CM

## 2016-12-27 MED ORDER — SILDENAFIL CITRATE 20 MG PO TABS: 20.0000 mg | ORAL_TABLET | Freq: Three times a day (TID) | ORAL | 6 refills | Status: DC | PRN

## 2016-12-27 MED ORDER — METRONIDAZOLE 500 MG PO TABS: 500.0000 mg | ORAL_TABLET | Freq: Three times a day (TID) | ORAL | 3 refills | Status: DC

## 2016-12-27 MED ORDER — NITROGLYCERIN 0.4 MG SL SUBL: SUBLINGUAL_TABLET | SUBLINGUAL | 6 refills | Status: DC

## 2016-12-27 MED ORDER — CIPROFLOXACIN HCL 500 MG PO TABS: 500.0000 mg | ORAL_TABLET | Freq: Two times a day (BID) | ORAL | 3 refills | Status: DC

## 2016-12-27 NOTE — Patient Instructions (Addendum)

## 2017-01-17 ENCOUNTER — Telehealth: Payer: Self-pay | Admitting: Cardiovascular Disease

## 2017-01-17 DIAGNOSIS — J301 Allergic rhinitis due to pollen: Secondary | ICD-10-CM | POA: Diagnosis not present

## 2017-01-17 DIAGNOSIS — T783XXA Angioneurotic edema, initial encounter: Secondary | ICD-10-CM | POA: Diagnosis not present

## 2017-01-17 NOTE — Telephone Encounter (Signed)
Pt states he is having a reaction to Enalapril. States his lips are swelling. States he saw his ENT, Dr. Beverly Gust and he suggested he call us. Please call.

## 2017-01-17 NOTE — Telephone Encounter (Signed)
Patient states that he went to ENT today and over the past 3 months he has had reactions to something. He reports that the physician feels this is related to his enalapril and requested that he stop taking it. He is concerned about stopping this medication given that he is taking 40 mg twice a day and has been on this for a long time. He states that the swelling is mostly involving his lips and does not affect his breathing or airway in anyway. He states that he roughly had 6 to 8 episodes during the last 3 months. He wanted to continue medication until he can get Dr. Donivan Scull recommendations on what he should do or if he needs to be on something else. Instructed him to seek care if at anytime he develops any swelling in other areas or trouble breathing. He verbalized understanding of what to monitor for and had no further questions at this time. He mentioned that Dr. Rockey Situ was out of the office and made him aware that there are others available if needed but he wants to wait for him to return. He was appreciative for the call and had no further questions at this time.

## 2017-01-19 ENCOUNTER — Other Ambulatory Visit: Payer: Self-pay | Admitting: Cardiovascular Disease

## 2017-01-19 NOTE — Telephone Encounter (Signed)
Would stop the enalapril, Change to losartan 100 mg daily

## 2017-01-20 ENCOUNTER — Other Ambulatory Visit: Payer: Self-pay | Admitting: Cardiovascular Disease

## 2017-01-20 MED ORDER — LOSARTAN POTASSIUM 100 MG PO TABS: 100.0000 mg | ORAL_TABLET | Freq: Every day | ORAL | 6 refills | Status: DC

## 2017-01-20 NOTE — Telephone Encounter (Signed)
Per last phone note   "Would stop the enalapril, Change to losartan 100 mg daily"   Please advise.

## 2017-01-20 NOTE — Telephone Encounter (Signed)
Per last phone note    Note    Would stop the enalapril, Change to losartan 100 mg daily

## 2017-01-20 NOTE — Telephone Encounter (Signed)
Spoke with patient and reviewed Dr. Donivan Scull recommendations to stop the enalapril and start on the losartan 100 mg daily. Verified which pharmacy to send it to and instructions to monitor blood pressures. He was appreciative of the call and had no further questions at this time.

## 2017-01-20 NOTE — Telephone Encounter (Signed)
Enalapril discontinued and patient started on losartan.

## 2017-02-21 DIAGNOSIS — X58XXXA Exposure to other specified factors, initial encounter: Secondary | ICD-10-CM | POA: Diagnosis not present

## 2017-02-21 DIAGNOSIS — T783XXA Angioneurotic edema, initial encounter: Secondary | ICD-10-CM | POA: Diagnosis not present

## 2017-03-13 ENCOUNTER — Other Ambulatory Visit: Payer: Self-pay | Admitting: Family Medicine

## 2017-03-13 DIAGNOSIS — E118 Type 2 diabetes mellitus with unspecified complications: Secondary | ICD-10-CM

## 2017-03-13 DIAGNOSIS — E782 Mixed hyperlipidemia: Secondary | ICD-10-CM

## 2017-03-13 DIAGNOSIS — Z125 Encounter for screening for malignant neoplasm of prostate: Secondary | ICD-10-CM

## 2017-03-21 ENCOUNTER — Other Ambulatory Visit: Payer: Self-pay | Admitting: Family Medicine

## 2017-03-21 ENCOUNTER — Other Ambulatory Visit (INDEPENDENT_AMBULATORY_CARE_PROVIDER_SITE_OTHER): Payer: BLUE CROSS/BLUE SHIELD

## 2017-03-21 DIAGNOSIS — E118 Type 2 diabetes mellitus with unspecified complications: Secondary | ICD-10-CM | POA: Diagnosis not present

## 2017-03-21 DIAGNOSIS — E782 Mixed hyperlipidemia: Secondary | ICD-10-CM

## 2017-03-21 DIAGNOSIS — Z125 Encounter for screening for malignant neoplasm of prostate: Secondary | ICD-10-CM | POA: Diagnosis not present

## 2017-03-21 LAB — COMPREHENSIVE METABOLIC PANEL
ALT: 65 U/L — ABNORMAL HIGH (ref 0–53)
AST: 56 U/L — ABNORMAL HIGH (ref 0–37)
Albumin: 4.5 g/dL (ref 3.5–5.2)
Alkaline Phosphatase: 48 U/L (ref 39–117)
BUN: 7 mg/dL (ref 6–23)
CO2: 33 mEq/L — ABNORMAL HIGH (ref 19–32)
Calcium: 9.8 mg/dL (ref 8.4–10.5)
Chloride: 101 mEq/L (ref 96–112)
Creatinine, Ser: 0.95 mg/dL (ref 0.40–1.50)
GFR: 86 mL/min (ref >60.00)
Glucose, Bld: 139 mg/dL — ABNORMAL HIGH (ref 70–99)
Potassium: 4.5 mEq/L (ref 3.5–5.1)
Sodium: 138 mEq/L (ref 135–145)
Total Bilirubin: 0.6 mg/dL (ref 0.2–1.2)
Total Protein: 7.1 g/dL (ref 6.0–8.3)

## 2017-03-21 LAB — LIPID PANEL
Cholesterol: 118 mg/dL (ref 0–200)
HDL: 36.6 mg/dL — ABNORMAL LOW (ref >39.00)
LDL Cholesterol: 62 mg/dL (ref 0–99)
NonHDL: 81.87
Total CHOL/HDL Ratio: 3
Triglycerides: 98 mg/dL (ref 0.0–149.0)
VLDL: 19.6 mg/dL (ref 0.0–40.0)

## 2017-03-21 LAB — HEMOGLOBIN A1C: Hgb A1c MFr Bld: 7.1 % — ABNORMAL HIGH (ref 4.6–6.5)

## 2017-03-21 LAB — PSA: PSA: 0.72 ng/mL (ref 0.10–4.00)

## 2017-03-28 ENCOUNTER — Ambulatory Visit (INDEPENDENT_AMBULATORY_CARE_PROVIDER_SITE_OTHER): Payer: BLUE CROSS/BLUE SHIELD | Admitting: Family Medicine

## 2017-03-28 ENCOUNTER — Encounter: Payer: Self-pay | Admitting: Family Medicine

## 2017-03-28 VITALS — BP 122/72 | HR 86 | Temp 97.7°F | Ht 68.75 in | Wt 250.8 lb

## 2017-03-28 DIAGNOSIS — E118 Type 2 diabetes mellitus with unspecified complications: Secondary | ICD-10-CM | POA: Diagnosis not present

## 2017-03-28 DIAGNOSIS — Z0001 Encounter for general adult medical examination with abnormal findings: Secondary | ICD-10-CM | POA: Diagnosis not present

## 2017-03-28 DIAGNOSIS — I10 Essential (primary) hypertension: Secondary | ICD-10-CM

## 2017-03-28 DIAGNOSIS — E782 Mixed hyperlipidemia: Secondary | ICD-10-CM | POA: Diagnosis not present

## 2017-03-28 DIAGNOSIS — Z0000 Encounter for general adult medical examination without abnormal findings: Secondary | ICD-10-CM

## 2017-03-28 NOTE — Assessment & Plan Note (Signed)
Chronic, stable. Continue current regimen. 

## 2017-03-28 NOTE — Assessment & Plan Note (Signed)
Chronic, reviewed mild deterioration noted. Pt attributes to poor diet /lifestyle choices these past 6 months.

## 2017-03-28 NOTE — Progress Notes (Signed)
BP 122/72   Pulse 86   Temp 97.7 F (36.5 C) (Oral)   Ht 5' 8.75" (1.746 m)   Wt 250 lb 12 oz (113.7 kg)   SpO2 98%   BMI 37.30 kg/m    CC: CPE Subjective:    Patient ID: Eric [name], male    DOB: [birth year], 61 y.o.   MRN: [id number]  HPI: Eric [name] is a 61 y.o. male presenting on 03/28/2017 for Annual Exam and Edema (lips)   Recent concern for angioedema - changed from enalapril to losartan. Referred to [facility] allergist and has epi pen available. Tested positive to shrimp.  Bowels more regular since starting fiber pills.  H/o recurrent diverticulitis.   Preventative: COLONOSCOPY WITH PROPOFOL; 03/14/2016; TAx1, diverticulosis, rpt 5 yrs; Lollie Sails, MD Prostate cancer screening - has had normal in past. Screened 2015, 2018.  Flu shot yearly  Pneumovax 03/10/2014 [facility]) Tetanus 2007, Tdap 2016 zostavax 07/2013 shingrix - discussed Seat belt use discussed Sunscreen use discussed, denies changing moles Ex smoker Alcohol - averages 6 pack/wk. No hangover.   Lives with brother  Occupation: truck Geophysicist/field seismologist, stocks stores  Activity: no regular exercise  Diet: good water, fruits/vegetables some - healthier eating choices   Relevant past medical, surgical, family and social history reviewed and updated as indicated. Interim medical history since our last visit reviewed. Allergies and medications reviewed and updated. Outpatient Medications Prior to Visit  Medication Sig Dispense Refill  . aspirin 162 MG EC tablet Take 162 mg by mouth daily.    . clopidogrel (PLAVIX) 75 MG tablet take 1 tablet by mouth once daily 90 tablet 3  . Coenzyme Q10 200 MG capsule Take 200 mg by mouth daily.    . cyclobenzaprine (FLEXERIL) 10 MG tablet Take 1 tablet (10 mg total) by mouth 3 (three) times daily as needed for muscle spasms. 30 tablet 1  . dicyclomine (BENTYL) 20 MG tablet Take 1 tablet (20 mg total) by mouth 3 (three) times daily as needed for spasms. 30 tablet 0  .  diphenhydrAMINE (BENADRYL) 25 MG tablet Take 25 mg by mouth every 6 (six) hours as needed.    [name] Calcium (STOOL SOFTENER PO) Take by mouth daily.    . hydrochlorothiazide (HYDRODIURIL) 25 MG tablet take 1 tablet by mouth once daily 90 tablet 3  . HYDROcodone-acetaminophen (NORCO) 5-325 MG tablet Take 1 tablet by mouth every 6 (six) hours as needed for moderate pain. 10 tablet 0  . losartan (COZAAR) 100 MG tablet Take 1 tablet (100 mg total) by mouth daily. 30 tablet 6  . metFORMIN (GLUCOPHAGE) 500 MG tablet take 3 tablets by mouth every morning with BREAKFAST 90 tablet 0  . Methylcellulose, Laxative, (CITRUCEL PO) Take by mouth. As needed    . metroNIDAZOLE (FLAGYL) 500 MG tablet Take 1 tablet (500 mg total) by mouth 3 (three) times daily. 30 tablet 3  . Multiple Vitamins-Minerals (CENTRUM SILVER) tablet Take 1 tablet by mouth daily.      . nitroGLYCERIN (NITROSTAT) 0.4 MG SL tablet place 1 tablet under the tongue if needed every 5 minutes for chest pain for 3 doses IF NO RELIEF AFTER 3RD DOSE CALL PRESCRIBER OR 911. 25 tablet 6  . NON FORMULARY Vioprex 1,400 take two tablets daily for joints.    . Omega-3 Fatty Acids (FISH OIL CONCENTRATE) 1000 MG CAPS Take three tablets daily.    [name] omeprazole (PRILOSEC) 40 MG capsule take 1 capsule by mouth once daily  30 capsule 6  . rosuvastatin (CRESTOR) 40 MG tablet take 1 tablet by mouth once daily 90 tablet 3  . sildenafil (REVATIO) 20 MG tablet Take 1 tablet (20 mg total) by mouth 3 (three) times daily as needed. 90 tablet 6  . traMADol (ULTRAM) 50 MG tablet Take 1 tablet (50 mg total) by mouth every 6 (six) hours as needed. 60 tablet 1  . ciprofloxacin (CIPRO) 500 MG tablet Take 1 tablet (500 mg total) by mouth 2 (two) times daily. 20 tablet 3   No facility-administered medications prior to visit.      Per HPI unless specifically indicated in ROS section below Review of Systems  Constitutional: Negative for activity change, appetite change,  chills, fatigue, fever and unexpected weight change.  HENT: Negative for hearing loss.   Eyes: Negative for visual disturbance.  Respiratory: Negative for cough, chest tightness, shortness of breath and wheezing.   Cardiovascular: Negative for chest pain, palpitations and leg swelling.  Gastrointestinal: Negative for abdominal distention, abdominal pain, blood in stool, constipation, diarrhea, nausea and vomiting.  Genitourinary: Negative for difficulty urinating and hematuria.  Musculoskeletal: Negative for arthralgias, myalgias and neck pain.  Skin: Negative for rash.  Neurological: Negative for dizziness, seizures, syncope and headaches.  Hematological: Negative for adenopathy. Does not bruise/bleed easily.  Psychiatric/Behavioral: Negative for dysphoric mood. The patient is not nervous/anxious.        Objective:    BP 122/72   Pulse 86   Temp 97.7 F (36.5 C) (Oral)   Ht 5' 8.75" (1.746 m)   Wt 250 lb 12 oz (113.7 kg)   SpO2 98%   BMI 37.30 kg/m   Wt Readings from Last 3 Encounters:  03/28/17 250 lb 12 oz (113.7 kg)  12/27/16 248 lb (112.5 kg)  09/20/16 251 lb 8 oz (114.1 kg)    Physical Exam  Constitutional: He is oriented to person, place, and time. He appears well-developed and well-nourished. No distress.  HENT:  Head: Normocephalic and atraumatic.  [name] Ear: Hearing, tympanic membrane, external ear and ear canal normal.  Left Ear: Hearing, tympanic membrane, external ear and ear canal normal.  Nose: Nose normal.  Mouth/Throat: Uvula is midline, oropharynx is clear and moist and mucous membranes are normal. No oropharyngeal exudate, posterior oropharyngeal edema or posterior oropharyngeal erythema.  Eyes: Pupils are equal, round, and reactive to light. Conjunctivae and EOM are normal. No scleral icterus.  Neck: Normal range of motion. Neck supple. No thyromegaly present.  Cardiovascular: Normal rate, regular rhythm, normal heart sounds and intact distal pulses.   No  murmur heard. Pulses:      Radial pulses are 2+ on the [name] side, and 2+ on the left side.  Pulmonary/Chest: Effort normal and breath sounds normal. No respiratory distress. He has no wheezes. He has no rales.  Abdominal: Soft. Bowel sounds are normal. He exhibits no distension and no mass. There is no tenderness. There is no rebound and no guarding.  Genitourinary: Rectum normal and prostate normal. Rectal exam shows no external hemorrhoid, no internal hemorrhoid, no fissure, no mass, no tenderness and anal tone normal. Prostate is not enlarged and not tender.  Musculoskeletal: Normal range of motion. He exhibits no edema.  Lymphadenopathy:    He has no cervical adenopathy.  Neurological: He is alert and oriented to person, place, and time.  CN grossly intact, station and gait intact  Skin: Skin is warm and dry. No rash noted.  Psychiatric: He has a normal mood and affect.  His behavior is normal. Judgment and thought content normal.  Nursing note and vitals reviewed.  Results for orders placed or performed in visit on 03/21/17  Lipid panel  Result Value Ref Range   Cholesterol 118 0 - 200 mg/dL   Triglycerides 98.0 0.0 - 149.0 mg/dL   HDL 36.60 (L) >39.00 mg/dL   VLDL 19.6 0.0 - 40.0 mg/dL   LDL Cholesterol 62 0 - 99 mg/dL   Total CHOL/HDL Ratio 3    NonHDL 81.87   Comprehensive metabolic panel  Result Value Ref Range   Sodium 138 135 - 145 mEq/L   Potassium 4.5 3.5 - 5.1 mEq/L   Chloride 101 96 - 112 mEq/L   CO2 33 (H) 19 - 32 mEq/L   Glucose, Bld 139 (H) 70 - 99 mg/dL   BUN 7 6 - 23 mg/dL   Creatinine, Ser 0.95 0.40 - 1.50 mg/dL   Total Bilirubin 0.6 0.2 - 1.2 mg/dL   Alkaline Phosphatase 48 39 - 117 U/L   AST 56 (H) 0 - 37 U/L   ALT 65 (H) 0 - 53 U/L   Total Protein 7.1 6.0 - 8.3 g/dL   Albumin 4.5 3.5 - 5.2 g/dL   Calcium 9.8 8.4 - 10.5 mg/dL   GFR 86.00 >60.00 mL/min  PSA  Result Value Ref Range   PSA 0.72 0.10 - 4.00 ng/mL  Hemoglobin A1c  Result Value Ref Range     Hgb A1c MFr Bld 7.1 (H) 4.6 - 6.5 %      Assessment & Plan:   Problem List Items Addressed This Visit    Diabetes mellitus type 2, controlled, with complications ([place])    Chronic, reviewed mild deterioration noted. Pt attributes to poor diet /lifestyle choices these past 6 months.      Health maintenance examination - Primary    Preventative protocols reviewed and updated unless pt declined. Discussed healthy diet and lifestyle.       Hyperlipidemia    Chronic, stable. Continue current regimen.       HYPERTENSION, BENIGN    Chronic, stable. Continue current regimen.       Severe obesity (BMI 35.0-39.9) with comorbidity ([place])    Discussed healthy diet and lifestyle changes to affect sustainable weight loss.           Follow up plan: Return in about 6 months (around 09/25/2017) for follow up visit.  Ria Bush, MD

## 2017-03-28 NOTE — Patient Instructions (Addendum)
If interested, check with pharmacy about new 2 shot shingles series (shingrix).  You are doing well today. Continue current medicines.  Return as needed or in 6 months for diabetes check.  Health Maintenance, Male A healthy lifestyle and preventive care is important for your health and wellness. Ask your health care provider about what schedule of regular examinations is right for you. What should I know about weight and diet? Eat a Healthy Diet  Eat plenty of vegetables, fruits, whole grains, low-fat dairy products, and lean protein.  Do not eat a lot of foods high in solid fats, added sugars, or salt.  Maintain a Healthy Weight Regular exercise can help you achieve or maintain a healthy weight. You should:  Do at least 150 minutes of exercise each week. The exercise should increase your heart rate and make you sweat (moderate-intensity exercise).  Do strength-training exercises at least twice a week.  Watch Your Levels of Cholesterol and Blood Lipids  Have your blood tested for lipids and cholesterol every 5 years starting at 60 years of age. If you are at high risk for heart disease, you should start having your blood tested when you are 60 years old. You may need to have your cholesterol levels checked more often if: ? Your lipid or cholesterol levels are high. ? You are older than 60 years of age. ? You are at high risk for heart disease.  What should I know about cancer screening? Many types of cancers can be detected early and may often be prevented. Lung Cancer  You should be screened every year for lung cancer if: ? You are a current smoker who has smoked for at least 30 years. ? You are a former smoker who has quit within the past 15 years.  Talk to your health care provider about your screening options, when you should start screening, and how often you should be screened.  Colorectal Cancer  Routine colorectal cancer screening usually begins at 60 years of age and  should be repeated every 5-10 years until you are 60 years old. You may need to be screened more often if early forms of precancerous polyps or small growths are found. Your health care provider may recommend screening at an earlier age if you have risk factors for colon cancer.  Your health care provider may recommend using home test kits to check for hidden blood in the stool.  A small camera at the end of a tube can be used to examine your colon (sigmoidoscopy or colonoscopy). This checks for the earliest forms of colorectal cancer.  Prostate and Testicular Cancer  Depending on your age and overall health, your health care provider may do certain tests to screen for prostate and testicular cancer.  Talk to your health care provider about any symptoms or concerns you have about testicular or prostate cancer.  Skin Cancer  Check your skin from head to toe regularly.  Tell your health care provider about any new moles or changes in moles, especially if: ? There is a change in a mole's size, shape, or color. ? You have a mole that is larger than a pencil eraser.  Always use sunscreen. Apply sunscreen liberally and repeat throughout the day.  Protect yourself by wearing long sleeves, pants, a wide-brimmed hat, and sunglasses when outside.  What should I know about heart disease, diabetes, and high blood pressure?  If you are 37-27 years of age, have your blood pressure checked every 3-5 years. If you  are 53 years of age or older, have your blood pressure checked every year. You should have your blood pressure measured twice-once when you are at a hospital or clinic, and once when you are not at a hospital or clinic. Record the average of the two measurements. To check your blood pressure when you are not at a hospital or clinic, you can use: ? An automated blood pressure machine at a pharmacy. ? A home blood pressure monitor.  Talk to your health care provider about your target blood  pressure.  If you are between 19-34 years old, ask your health care provider if you should take aspirin to prevent heart disease.  Have regular diabetes screenings by checking your fasting blood sugar level. ? If you are at a normal weight and have a low risk for diabetes, have this test once every three years after the age of 37. ? If you are overweight and have a high risk for diabetes, consider being tested at a younger age or more often.  A one-time screening for abdominal aortic aneurysm (AAA) by ultrasound is recommended for men aged 79-75 years who are current or former smokers. What should I know about preventing infection? Hepatitis B If you have a higher risk for hepatitis B, you should be screened for this virus. Talk with your health care provider to find out if you are at risk for hepatitis B infection. Hepatitis C Blood testing is recommended for:  Everyone born from [birth year] through 1964.  Anyone with known risk factors for hepatitis C.  Sexually Transmitted Diseases (STDs)  You should be screened each year for STDs including gonorrhea and chlamydia if: ? You are sexually active and are younger than 60 years of age. ? You are older than 60 years of age and your health care provider tells you that you are at risk for this type of infection. ? Your sexual activity has changed since you were last screened and you are at an increased risk for chlamydia or gonorrhea. Ask your health care provider if you are at risk.  Talk with your health care provider about whether you are at high risk of being infected with HIV. Your health care provider may recommend a prescription medicine to help prevent HIV infection.  What else can I do?  Schedule regular health, dental, and eye exams.  Stay current with your vaccines (immunizations).  Do not use any tobacco products, such as cigarettes, chewing tobacco, and e-cigarettes. If you need help quitting, ask your health care  provider.  Limit alcohol intake to no more than 2 drinks per day. One drink equals 12 ounces of beer, 5 ounces of wine, or 1 ounces of hard liquor.  Do not use street drugs.  Do not share needles.  Ask your health care provider for help if you need support or information about quitting drugs.  Tell your health care provider if you often feel depressed.  Tell your health care provider if you have ever been abused or do not feel safe at home. This information is not intended to replace advice given to you by your health care provider. Make sure you discuss any questions you have with your health care provider. Document Released: 01/07/2008 Document Revised: 03/09/2016 Document Reviewed: 04/14/2015 Elsevier Interactive Patient Education  [employer].

## 2017-03-28 NOTE — Assessment & Plan Note (Signed)
Preventative protocols reviewed and updated unless pt declined. Discussed healthy diet and lifestyle.  

## 2017-03-28 NOTE — Assessment & Plan Note (Signed)
Discussed healthy diet and lifestyle changes to affect sustainable weight loss  

## 2017-04-11 DIAGNOSIS — H40003 Preglaucoma, unspecified, bilateral: Secondary | ICD-10-CM | POA: Diagnosis not present

## 2017-04-18 ENCOUNTER — Other Ambulatory Visit: Payer: Self-pay | Admitting: Family Medicine

## 2017-04-18 DIAGNOSIS — H40053 Ocular hypertension, bilateral: Secondary | ICD-10-CM | POA: Diagnosis not present

## 2017-04-18 LAB — HM DIABETES EYE EXAM

## 2017-04-20 ENCOUNTER — Encounter: Payer: Self-pay | Admitting: Family Medicine

## 2017-04-25 DIAGNOSIS — T783XXA Angioneurotic edema, initial encounter: Secondary | ICD-10-CM | POA: Diagnosis not present

## 2017-04-25 DIAGNOSIS — Z6834 Body mass index (BMI) 34.0-34.9, adult: Secondary | ICD-10-CM | POA: Diagnosis not present

## 2017-05-09 DIAGNOSIS — M7541 Impingement syndrome of right shoulder: Secondary | ICD-10-CM | POA: Diagnosis not present

## 2017-05-09 DIAGNOSIS — M7542 Impingement syndrome of left shoulder: Secondary | ICD-10-CM | POA: Diagnosis not present

## 2017-05-09 DIAGNOSIS — E669 Obesity, unspecified: Secondary | ICD-10-CM | POA: Diagnosis not present

## 2017-06-18 ENCOUNTER — Other Ambulatory Visit: Payer: Self-pay | Admitting: Cardiovascular Disease

## 2017-07-04 DIAGNOSIS — T783XXD Angioneurotic edema, subsequent encounter: Secondary | ICD-10-CM | POA: Diagnosis not present

## 2017-07-04 DIAGNOSIS — R609 Edema, unspecified: Secondary | ICD-10-CM | POA: Diagnosis not present

## 2017-07-04 DIAGNOSIS — Z6837 Body mass index (BMI) 37.0-37.9, adult: Secondary | ICD-10-CM | POA: Diagnosis not present

## 2017-07-10 DIAGNOSIS — B079 Viral wart, unspecified: Secondary | ICD-10-CM | POA: Diagnosis not present

## 2017-07-10 DIAGNOSIS — D485 Neoplasm of uncertain behavior of skin: Secondary | ICD-10-CM | POA: Diagnosis not present

## 2017-07-10 DIAGNOSIS — L219 Seborrheic dermatitis, unspecified: Secondary | ICD-10-CM | POA: Diagnosis not present

## 2017-07-10 DIAGNOSIS — L719 Rosacea, unspecified: Secondary | ICD-10-CM | POA: Diagnosis not present
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
## 2017-08-01 ENCOUNTER — Encounter: Payer: Self-pay | Admitting: Cardiovascular Disease

## 2017-08-01 ENCOUNTER — Ambulatory Visit (INDEPENDENT_AMBULATORY_CARE_PROVIDER_SITE_OTHER): Payer: BLUE CROSS/BLUE SHIELD | Admitting: Cardiovascular Disease

## 2017-08-01 VITALS — BP 124/81 | HR 75 | Ht 69.0 in | Wt 253.0 lb

## 2017-08-01 DIAGNOSIS — I10 Essential (primary) hypertension: Secondary | ICD-10-CM | POA: Diagnosis not present

## 2017-08-01 DIAGNOSIS — E782 Mixed hyperlipidemia: Secondary | ICD-10-CM | POA: Diagnosis not present

## 2017-08-01 DIAGNOSIS — E118 Type 2 diabetes mellitus with unspecified complications: Secondary | ICD-10-CM

## 2017-08-01 DIAGNOSIS — I25118 Atherosclerotic heart disease of native coronary artery with other forms of angina pectoris: Secondary | ICD-10-CM

## 2017-08-01 MED ORDER — SILDENAFIL CITRATE 20 MG PO TABS: 20.0000 mg | ORAL_TABLET | Freq: Three times a day (TID) | ORAL | 6 refills | Status: DC | PRN

## 2017-08-01 MED ORDER — TRAMADOL HCL 50 MG PO TABS: 50.0000 mg | ORAL_TABLET | Freq: Four times a day (QID) | ORAL | 1 refills | Status: DC | PRN

## 2017-08-01 MED ORDER — CYCLOBENZAPRINE HCL 10 MG PO TABS: 10.0000 mg | ORAL_TABLET | Freq: Three times a day (TID) | ORAL | 1 refills | Status: DC | PRN

## 2017-08-01 NOTE — Patient Instructions (Addendum)
Medication Instructions:   No medication changes made  Labwork:  No new labs needed  Testing/Procedures:  No further testing at this time   Follow-Up: It was a pleasure seeing you in the office today. Please call us if you have new issues that need to be addressed before your next appt.  [phone]  Your physician wants you to follow-up in: 6 months at his request You will receive a reminder letter in the mail two months in advance. If you don't receive a letter, please call our office to schedule the follow-up appointment.  If you need a refill on your cardiac medications before your next appointment, please call your pharmacy.

## 2017-08-05 ENCOUNTER — Other Ambulatory Visit: Payer: Self-pay | Admitting: Cardiovascular Disease

## 2017-09-17 ENCOUNTER — Other Ambulatory Visit: Payer: Self-pay | Admitting: Family Medicine

## 2017-09-17 DIAGNOSIS — E118 Type 2 diabetes mellitus with unspecified complications: Secondary | ICD-10-CM

## 2017-09-19 ENCOUNTER — Other Ambulatory Visit: Payer: BLUE CROSS/BLUE SHIELD

## 2017-09-26 ENCOUNTER — Ambulatory Visit (INDEPENDENT_AMBULATORY_CARE_PROVIDER_SITE_OTHER): Payer: BLUE CROSS/BLUE SHIELD | Admitting: Family Medicine

## 2017-09-26 ENCOUNTER — Encounter: Payer: Self-pay | Admitting: Family Medicine

## 2017-09-26 VITALS — BP 136/80 | HR 78 | Temp 97.6°F | Wt 261.5 lb

## 2017-09-26 DIAGNOSIS — E782 Mixed hyperlipidemia: Secondary | ICD-10-CM

## 2017-09-26 DIAGNOSIS — I25118 Atherosclerotic heart disease of native coronary artery with other forms of angina pectoris: Secondary | ICD-10-CM | POA: Diagnosis not present

## 2017-09-26 DIAGNOSIS — E118 Type 2 diabetes mellitus with unspecified complications: Secondary | ICD-10-CM | POA: Diagnosis not present

## 2017-09-26 LAB — COMPREHENSIVE METABOLIC PANEL
ALT: 59 U/L — ABNORMAL HIGH (ref 0–53)
AST: 47 U/L — ABNORMAL HIGH (ref 0–37)
Albumin: 4.3 g/dL (ref 3.5–5.2)
Alkaline Phosphatase: 55 U/L (ref 39–117)
BUN: 9 mg/dL (ref 6–23)
CO2: 30 mEq/L (ref 19–32)
Calcium: 9.6 mg/dL (ref 8.4–10.5)
Chloride: 100 mEq/L (ref 96–112)
Creatinine, Ser: 0.82 mg/dL (ref 0.40–1.50)
GFR: 101.74 mL/min (ref >60.00)
Glucose, Bld: 142 mg/dL — ABNORMAL HIGH (ref 70–99)
Potassium: 4.2 mEq/L (ref 3.5–5.1)
Sodium: 137 mEq/L (ref 135–145)
Total Bilirubin: 0.5 mg/dL (ref 0.2–1.2)
Total Protein: 7.2 g/dL (ref 6.0–8.3)

## 2017-09-26 LAB — HEMOGLOBIN A1C: Hgb A1c MFr Bld: 7.8 % — ABNORMAL HIGH (ref 4.6–6.5)

## 2017-09-26 LAB — LDL CHOLESTEROL, DIRECT: Direct LDL: 57 mg/dL

## 2017-09-26 NOTE — Assessment & Plan Note (Signed)
Chronic. Update A1c. Reviewed diabetic diet.  If persistently >7%, discussed starting checking sugars.

## 2017-09-26 NOTE — Patient Instructions (Addendum)
Labs today. Work on low carb low sugar diabetic diet.  Return as needed or in 6 months for physical.  If we need glucose meter, I will ask you to check with your insurance about preferred brand.

## 2017-09-26 NOTE — Assessment & Plan Note (Addendum)
Chronic, stable on crestor and fish oil. Update dLDL. Goal <70 given ASCVD s/p stent

## 2017-09-26 NOTE — Progress Notes (Signed)
BP 136/80 (BP Location: Left Arm, Patient Position: Sitting, Cuff Size: Large)   Pulse 78   Temp 97.6 F (36.4 C) (Oral)   Wt 261 lb 8 oz (118.6 kg)   SpO2 94%   BMI 38.62 kg/m    CC: 6 mo DM f/u visit Subjective:    Patient ID: Eric [name], male    DOB: 07-26-1955, 62 y.o.   MRN: [id number]  HPI: Eric [name] is a 62 y.o. male presenting on 09/26/2017 for Diabetes (6 month follow-up)   "Wally"  DM - does not regularly check sugars. Compliant with antihyperglycemic regimen which includes: metformin 1500mg  AM. Denies hypoglycemic symptoms. Denies paresthesias. Last diabetic eye exam 03/2017. Pneumovax: 2015. Prevnar: not due. Glucometer brand: doesn't have one. DSME: has not completed - not interested at this time due to schedule.  Lab Results  Component Value Date   HGBA1C 7.1 (H) 03/21/2017   Diabetic Foot Exam - Simple   Simple Foot Form Diabetic Foot exam was performed with the following findings:  Yes 09/26/2017 12:51 PM  Visual Inspection No deformities, no ulcerations, no other skin breakdown bilaterally:  Yes Sensation Testing Intact to touch and monofilament testing bilaterally:  Yes Pulse Check Posterior Tibialis and Dorsalis pulse intact bilaterally:  Yes Comments    Lab Results  Component Value Date   MICROALBUR <0.7 03/10/2015     Relevant past medical, surgical, family and social history reviewed and updated as indicated. Interim medical history since our last visit reviewed. Allergies and medications reviewed and updated. Outpatient Medications Prior to Visit  Medication Sig Dispense Refill  . aspirin 162 MG EC tablet Take 162 mg by mouth daily.    . clopidogrel (PLAVIX) 75 MG tablet take 1 tablet by mouth once daily 90 tablet 3  . Coenzyme Q10 200 MG capsule Take 200 mg by mouth daily.    . cyclobenzaprine (FLEXERIL) 10 MG tablet Take 1 tablet (10 mg total) by mouth 3 (three) times daily as needed for muscle spasms. 30 tablet 1  .  diphenhydrAMINE (BENADRYL) 25 MG tablet Take 25 mg by mouth every 6 (six) hours as needed.    [name] Calcium (STOOL SOFTENER PO) Take by mouth daily.    . fexofenadine (ALLEGRA) 180 MG tablet Take 180 mg by mouth.    . hydrochlorothiazide (HYDRODIURIL) 25 MG tablet take 1 tablet by mouth once daily 90 tablet 3  . HYDROcodone-acetaminophen (NORCO) 5-325 MG tablet Take 1 tablet by mouth every 6 (six) hours as needed for moderate pain. 10 tablet 0  . losartan (COZAAR) 100 MG tablet take 1 tablet by mouth once daily 30 tablet 6  . metFORMIN (GLUCOPHAGE) 500 MG tablet take 3 tablets by mouth every morning with BREAKFAST 90 tablet 5  . Methylcellulose, Laxative, (CITRUCEL PO) Take by mouth. As needed    . metroNIDAZOLE (FLAGYL) 500 MG tablet Take 1 tablet (500 mg total) by mouth 3 (three) times daily. 30 tablet 3  . Multiple Vitamins-Minerals (CENTRUM SILVER) tablet Take 1 tablet by mouth daily.      . nitroGLYCERIN (NITROSTAT) 0.4 MG SL tablet place 1 tablet under the tongue if needed every 5 minutes for chest pain for 3 doses IF NO RELIEF AFTER 3RD DOSE CALL PRESCRIBER OR 911. 25 tablet 6  . NON FORMULARY Vioprex 1,400 take two tablets daily for joints.    . Omega-3 Fatty Acids (FISH OIL CONCENTRATE) 1000 MG CAPS Take three tablets daily.    [name] omeprazole (PRILOSEC) 40  MG capsule take 1 capsule by mouth once daily 30 capsule 6  . ranitidine (ZANTAC) 300 MG tablet Take 300 mg by mouth.    . rosuvastatin (CRESTOR) 40 MG tablet take 1 tablet by mouth once daily 90 tablet 3  . sildenafil (REVATIO) 20 MG tablet Take 1 tablet (20 mg total) by mouth 3 (three) times daily as needed. 90 tablet 6  . traMADol (ULTRAM) 50 MG tablet Take 1 tablet (50 mg total) by mouth every 6 (six) hours as needed. 60 tablet 1  . dicyclomine (BENTYL) 20 MG tablet Take 1 tablet (20 mg total) by mouth 3 (three) times daily as needed for spasms. (Patient not taking: Reported on 09/26/2017) 30 tablet 0   No facility-administered  medications prior to visit.      Per HPI unless specifically indicated in ROS section below Review of Systems     Objective:    BP 136/80 (BP Location: Left Arm, Patient Position: Sitting, Cuff Size: Large)   Pulse 78   Temp 97.6 F (36.4 C) (Oral)   Wt 261 lb 8 oz (118.6 kg)   SpO2 94%   BMI 38.62 kg/m   Wt Readings from Last 3 Encounters:  09/26/17 261 lb 8 oz (118.6 kg)  08/01/17 253 lb (114.8 kg)  03/28/17 250 lb 12 oz (113.7 kg)    Physical Exam  Constitutional: He appears well-developed and well-nourished. No distress.  HENT:  Head: Normocephalic and atraumatic.  Mouth/Throat: Oropharynx is clear and moist. No oropharyngeal exudate.  Eyes: Conjunctivae and EOM are normal. Pupils are equal, round, and reactive to light. No scleral icterus.  Neck: Normal range of motion. Neck supple.  Cardiovascular: Normal rate, regular rhythm, normal heart sounds and intact distal pulses.  No murmur heard. Pulmonary/Chest: Effort normal and breath sounds normal. No respiratory distress. He has no wheezes. He has no rales.  Musculoskeletal: He exhibits no edema.  See HPI for foot exam if done  Lymphadenopathy:    He has no cervical adenopathy.  Skin: Skin is warm and dry. No rash noted.  Psychiatric: He has a normal mood and affect.  Nursing note and vitals reviewed.  Results for orders placed or performed in visit on 04/20/17  HM DIABETES EYE EXAM  Result Value Ref Range   HM Diabetic Eye Exam No Retinopathy No Retinopathy      Assessment & Plan:   Problem List Items Addressed This Visit    CAD, NATIVE VESSEL   Diabetes mellitus type 2, controlled, with complications ([place]) - Primary    Chronic. Update A1c. Reviewed diabetic diet.  If persistently >7%, discussed starting checking sugars.      Hyperlipidemia    Chronic, stable on crestor and fish oil. Update dLDL. Goal <70 given ASCVD s/p stent      Relevant Orders   LDL Cholesterol, Direct   Severe obesity (BMI  35.0-39.9) with comorbidity ([place])    Weight gain noted.           No orders of the defined types were placed in this encounter.  Orders Placed This Encounter  Procedures  . LDL Cholesterol, Direct    Follow up plan: Return in about 6 months (around 03/29/2018) for annual exam, prior fasting for blood work.  Ria Bush, MD

## 2017-09-26 NOTE — Assessment & Plan Note (Signed)
Weight gain noted.

## 2017-09-29 ENCOUNTER — Other Ambulatory Visit: Payer: Self-pay | Admitting: Family Medicine

## 2017-10-17 DIAGNOSIS — H40003 Preglaucoma, unspecified, bilateral: Secondary | ICD-10-CM | POA: Diagnosis not present

## 2017-11-07 ENCOUNTER — Other Ambulatory Visit: Payer: Self-pay | Admitting: Family Medicine

## 2018-01-06 ENCOUNTER — Other Ambulatory Visit: Payer: Self-pay | Admitting: Cardiovascular Disease

## 2018-02-06 ENCOUNTER — Other Ambulatory Visit: Payer: Self-pay | Admitting: Cardiovascular Disease

## 2018-03-02 DIAGNOSIS — J111 Influenza due to unidentified influenza virus with other respiratory manifestations: Secondary | ICD-10-CM | POA: Diagnosis not present
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
## 2018-03-06 ENCOUNTER — Ambulatory Visit (INDEPENDENT_AMBULATORY_CARE_PROVIDER_SITE_OTHER): Payer: BLUE CROSS/BLUE SHIELD | Admitting: Cardiovascular Disease

## 2018-03-06 ENCOUNTER — Other Ambulatory Visit: Payer: Self-pay | Admitting: Cardiovascular Disease

## 2018-03-06 ENCOUNTER — Encounter: Payer: Self-pay | Admitting: Cardiovascular Disease

## 2018-03-06 VITALS — BP 128/78 | HR 64 | Ht 69.0 in | Wt 232.2 lb

## 2018-03-06 DIAGNOSIS — I25118 Atherosclerotic heart disease of native coronary artery with other forms of angina pectoris: Secondary | ICD-10-CM | POA: Diagnosis not present

## 2018-03-06 DIAGNOSIS — E118 Type 2 diabetes mellitus with unspecified complications: Secondary | ICD-10-CM

## 2018-03-06 DIAGNOSIS — E782 Mixed hyperlipidemia: Secondary | ICD-10-CM

## 2018-03-06 DIAGNOSIS — I10 Essential (primary) hypertension: Secondary | ICD-10-CM

## 2018-03-06 DIAGNOSIS — K5790 Diverticulosis of intestine, part unspecified, without perforation or abscess without bleeding: Secondary | ICD-10-CM

## 2018-03-06 MED ORDER — SILDENAFIL CITRATE 20 MG PO TABS: 20.0000 mg | ORAL_TABLET | Freq: Three times a day (TID) | ORAL | 6 refills | Status: DC | PRN

## 2018-03-06 MED ORDER — FEXOFENADINE HCL 180 MG PO TABS: 180.0000 mg | ORAL_TABLET | Freq: Every day | ORAL | 3 refills | Status: DC

## 2018-03-06 MED ORDER — TRAMADOL HCL 50 MG PO TABS: 50.0000 mg | ORAL_TABLET | Freq: Four times a day (QID) | ORAL | 1 refills | Status: DC | PRN

## 2018-03-06 MED ORDER — CIPROFLOXACIN HCL 500 MG PO TABS: 500.0000 mg | ORAL_TABLET | Freq: Two times a day (BID) | ORAL | 3 refills | Status: DC

## 2018-03-06 MED ORDER — METRONIDAZOLE 500 MG PO TABS: 500.0000 mg | ORAL_TABLET | Freq: Three times a day (TID) | ORAL | 3 refills | Status: DC

## 2018-03-06 MED ORDER — CYCLOBENZAPRINE HCL 10 MG PO TABS: 10.0000 mg | ORAL_TABLET | Freq: Three times a day (TID) | ORAL | 1 refills | Status: DC | PRN

## 2018-03-06 MED ORDER — RANITIDINE HCL 300 MG PO TABS: 300.0000 mg | ORAL_TABLET | Freq: Every day | ORAL | 3 refills | Status: DC

## 2018-03-06 MED ORDER — LOSARTAN POTASSIUM 100 MG PO TABS: 100.0000 mg | ORAL_TABLET | Freq: Every day | ORAL | 11 refills | Status: DC

## 2018-03-06 NOTE — Patient Instructions (Signed)

## 2018-03-10 ENCOUNTER — Other Ambulatory Visit: Payer: Self-pay | Admitting: Cardiovascular Disease

## 2018-03-27 ENCOUNTER — Other Ambulatory Visit: Payer: Self-pay | Admitting: Family Medicine

## 2018-03-27 ENCOUNTER — Other Ambulatory Visit (INDEPENDENT_AMBULATORY_CARE_PROVIDER_SITE_OTHER): Payer: BLUE CROSS/BLUE SHIELD

## 2018-03-27 DIAGNOSIS — Z125 Encounter for screening for malignant neoplasm of prostate: Secondary | ICD-10-CM

## 2018-03-27 DIAGNOSIS — E782 Mixed hyperlipidemia: Secondary | ICD-10-CM

## 2018-03-27 DIAGNOSIS — R740 Nonspecific elevation of levels of transaminase and lactic acid dehydrogenase [LDH]: Secondary | ICD-10-CM

## 2018-03-27 DIAGNOSIS — E118 Type 2 diabetes mellitus with unspecified complications: Secondary | ICD-10-CM | POA: Diagnosis not present

## 2018-03-27 DIAGNOSIS — R7401 Elevation of levels of liver transaminase levels: Secondary | ICD-10-CM

## 2018-03-27 LAB — COMPREHENSIVE METABOLIC PANEL
ALT: 31 U/L (ref 0–53)
AST: 17 U/L (ref 0–37)
Albumin: 4.5 g/dL (ref 3.5–5.2)
Alkaline Phosphatase: 42 U/L (ref 39–117)
BUN: 16 mg/dL (ref 6–23)
CO2: 29 mEq/L (ref 19–32)
Calcium: 9.6 mg/dL (ref 8.4–10.5)
Chloride: 102 mEq/L (ref 96–112)
Creatinine, Ser: 0.96 mg/dL (ref 0.40–1.50)
GFR: 84.68 mL/min (ref >60.00)
Glucose, Bld: 113 mg/dL — ABNORMAL HIGH (ref 70–99)
Potassium: 4.6 mEq/L (ref 3.5–5.1)
Sodium: 140 mEq/L (ref 135–145)
Total Bilirubin: 0.6 mg/dL (ref 0.2–1.2)
Total Protein: 7 g/dL (ref 6.0–8.3)

## 2018-03-27 LAB — LIPID PANEL
Cholesterol: 113 mg/dL (ref 0–200)
HDL: 37.7 mg/dL — ABNORMAL LOW (ref >39.00)
LDL Cholesterol: 56 mg/dL (ref 0–99)
NonHDL: 75.73
Total CHOL/HDL Ratio: 3
Triglycerides: 97 mg/dL (ref 0.0–149.0)
VLDL: 19.4 mg/dL (ref 0.0–40.0)

## 2018-03-27 LAB — IBC PANEL
Iron: 76 ug/dL (ref 42–165)
Saturation Ratios: 17.5 % — ABNORMAL LOW (ref 20.0–50.0)
Transferrin: 310 mg/dL (ref 212.0–360.0)

## 2018-03-27 LAB — CBC WITH DIFFERENTIAL/PLATELET
Basophils Absolute: 0.1 10*3/uL (ref 0.0–0.1)
Basophils Relative: 1 % (ref 0.0–3.0)
Eosinophils Absolute: 0.4 10*3/uL (ref 0.0–0.7)
Eosinophils Relative: 5.8 % — ABNORMAL HIGH (ref 0.0–5.0)
HCT: 48 % (ref 39.0–52.0)
Hemoglobin: 16.3 g/dL (ref 13.0–17.0)
Lymphocytes Relative: 22.2 % (ref 12.0–46.0)
Lymphs Abs: 1.4 10*3/uL (ref 0.7–4.0)
MCHC: 34 g/dL (ref 30.0–36.0)
MCV: 88.3 fl (ref 78.0–100.0)
Monocytes Absolute: 0.8 10*3/uL (ref 0.1–1.0)
Monocytes Relative: 11.9 % (ref 3.0–12.0)
Neutro Abs: 3.8 10*3/uL (ref 1.4–7.7)
Neutrophils Relative %: 59.1 % (ref 43.0–77.0)
Platelets: 167 10*3/uL (ref 150.0–400.0)
RBC: 5.44 Mil/uL (ref 4.22–5.81)
RDW: 14.3 % (ref 11.5–15.5)
WBC: 6.4 10*3/uL (ref 4.0–10.5)

## 2018-03-27 LAB — HEMOGLOBIN A1C: Hgb A1c MFr Bld: 6.4 % (ref 4.6–6.5)

## 2018-03-27 LAB — PSA: PSA: 0.58 ng/mL (ref 0.10–4.00)

## 2018-04-03 ENCOUNTER — Other Ambulatory Visit: Payer: Self-pay | Admitting: Cardiovascular Disease

## 2018-04-03 ENCOUNTER — Ambulatory Visit (INDEPENDENT_AMBULATORY_CARE_PROVIDER_SITE_OTHER): Payer: BLUE CROSS/BLUE SHIELD | Admitting: Family Medicine

## 2018-04-03 ENCOUNTER — Encounter: Payer: Self-pay | Admitting: Family Medicine

## 2018-04-03 VITALS — BP 124/72 | HR 71 | Temp 97.9°F | Ht 68.5 in | Wt 231.5 lb

## 2018-04-03 DIAGNOSIS — E669 Obesity, unspecified: Secondary | ICD-10-CM

## 2018-04-03 DIAGNOSIS — Z0000 Encounter for general adult medical examination without abnormal findings: Secondary | ICD-10-CM

## 2018-04-03 DIAGNOSIS — I10 Essential (primary) hypertension: Secondary | ICD-10-CM

## 2018-04-03 DIAGNOSIS — Z23 Encounter for immunization: Secondary | ICD-10-CM | POA: Diagnosis not present

## 2018-04-03 DIAGNOSIS — I2510 Atherosclerotic heart disease of native coronary artery without angina pectoris: Secondary | ICD-10-CM

## 2018-04-03 DIAGNOSIS — E782 Mixed hyperlipidemia: Secondary | ICD-10-CM

## 2018-04-03 DIAGNOSIS — E118 Type 2 diabetes mellitus with unspecified complications: Secondary | ICD-10-CM

## 2018-04-03 DIAGNOSIS — K5790 Diverticulosis of intestine, part unspecified, without perforation or abscess without bleeding: Secondary | ICD-10-CM

## 2018-04-03 DIAGNOSIS — M25512 Pain in left shoulder: Secondary | ICD-10-CM

## 2018-04-03 DIAGNOSIS — G8929 Other chronic pain: Secondary | ICD-10-CM

## 2018-04-03 DIAGNOSIS — M25511 Pain in right shoulder: Secondary | ICD-10-CM

## 2018-04-03 NOTE — Assessment & Plan Note (Signed)
Chronic, stable. Continue current regimen. 

## 2018-04-03 NOTE — Assessment & Plan Note (Signed)
Appreciate cards care. Avoid NSAIDs.

## 2018-04-03 NOTE — Progress Notes (Signed)
BP 124/72 (BP Location: Left Arm, Patient Position: Sitting, Cuff Size: Large)   Pulse 71   Temp 97.9 F (36.6 C) (Oral)   Ht 5' 8.5" (1.74 m)   Wt 231 lb 8 oz (105 kg)   SpO2 95%   BMI 34.69 kg/m    CC: CPE Subjective:    Patient ID: [name], male    DOB: 08-May-1956, 62 y.o.   MRN: [id number]  HPI: JOSYAH ACHOR is a 62 y.o. male presenting on 04/03/2018 for Annual Exam   30 lb weight loss in 6 months. He has stopped bread and potatoes. This is sustainable. Some increased constipation noted.   Angioedema 2018 - enalapril changed to losartan. Referred to [facility] allergist and tested positive for shrimp - has epi pen available.   H/o recurrent diverticulitis - has been prescribed abx to have on hand by cardiology. Recent stable cardiac evaluation.   Shoulder pains - gets yearly steroid injections in the fall/winter through ortho. Also receives tramadol (through cards) and sparing hydrocodone (through ortho) for breakthrough pain. Prior on vioprex. Ortho may be retiring, asks about narcotics through our office.   Preventative: COLONOSCOPY WITH PROPOFOL;03/14/2016;TAx1, diverticulosis, rpt 5 yrs; Lollie Sails, MD Prostate cancer screening - has had normal in past. DRE 2015, 2018.  Lung cancer screening - not eligible Flu shot yearly  Pneumovax 03/10/2014 [facility] Aid) Tetanus 2007, Tdap 2016 zostavax 07/2013 shingrix - pt states he completed  Seat belt use discussed Sunscreen use discussed, denies changing moles Ex smoker - quit 03/2007. Smoked 1ppd for 20 yrs. ~20 PY hx Alcohol - averages 5-10 beers/wk. No hangover.  Dentist Q6 mo Eye exam yearly  Lives with brother  Occupation: truck driver, stocks stores  Activity: started walking  Diet: good water, fruits/vegetables some - healthier eating choices   Relevant past medical, surgical, family and social history reviewed and updated as indicated. Interim medical history since our last visit  reviewed. Allergies and medications reviewed and updated. Outpatient Medications Prior to Visit  Medication Sig Dispense Refill  . aspirin EC 81 MG tablet Take 1 tablet (81 mg total) by mouth daily. 90 tablet 3  . ciprofloxacin (CIPRO) 500 MG tablet Take 1 tablet (500 mg total) by mouth 2 (two) times daily. (Patient taking differently: Take 500 mg by mouth 2 (two) times daily. As needed for diverticulitis) 20 tablet 3  . clopidogrel (PLAVIX) 75 MG tablet take 1 tablet by mouth once daily 90 tablet 0  . Coenzyme Q10 200 MG capsule Take 200 mg by mouth daily.    . cyclobenzaprine (FLEXERIL) 10 MG tablet Take 1 tablet (10 mg total) by mouth 3 (three) times daily as needed for muscle spasms. 30 tablet 1  . dicyclomine (BENTYL) 20 MG tablet Take 1 tablet (20 mg total) by mouth 3 (three) times daily as needed for spasms. 30 tablet 0  . diphenhydrAMINE (BENADRYL) 25 MG tablet Take 25 mg by mouth every 6 (six) hours as needed.    [name] Calcium (STOOL SOFTENER PO) Take by mouth daily.    . fexofenadine (ALLEGRA) 180 MG tablet Take 1 tablet (180 mg total) by mouth daily. 90 tablet 3  . hydrochlorothiazide (HYDRODIURIL) 25 MG tablet take 1 tablet by mouth once daily 90 tablet 0  . HYDROcodone-acetaminophen (NORCO) 5-325 MG tablet Take 1 tablet by mouth every 6 (six) hours as needed for moderate pain. 10 tablet 0  . losartan (COZAAR) 100 MG tablet TAKE 1 TABLET BY MOUTH ONCE  DAILY 30 tablet 11  . metFORMIN (GLUCOPHAGE) 500 MG tablet take 3 tablets by mouth every morning with BREAKFAST 90 tablet 5  . Methylcellulose, Laxative, (CITRUCEL PO) Take by mouth. As needed    . metroNIDAZOLE (FLAGYL) 500 MG tablet Take 1 tablet (500 mg total) by mouth 3 (three) times daily. (Patient taking differently: Take 500 mg by mouth 3 (three) times daily. As needed for diverticulitis) 30 tablet 3  . Multiple Vitamins-Minerals (CENTRUM SILVER) tablet Take 1 tablet by mouth daily.      . nitroGLYCERIN (NITROSTAT) 0.4 MG SL  tablet place 1 tablet under the tongue if needed every 5 minutes for chest pain for 3 doses IF NO RELIEF AFTER 3RD DOSE CALL PRESCRIBER OR 911. 25 tablet 6  . NON FORMULARY Vioprex 1,400 take two tablets daily for joints.    . Omega-3 Fatty Acids (FISH OIL CONCENTRATE) 1000 MG CAPS Take three tablets daily.    [name] omeprazole (PRILOSEC) 40 MG capsule take 1 capsule by mouth once daily 30 capsule 1  . ranitidine (ZANTAC) 300 MG tablet Take 1 tablet (300 mg total) by mouth daily. 90 tablet 3  . rosuvastatin (CRESTOR) 40 MG tablet take 1 tablet by mouth once daily 90 tablet 0  . sildenafil (REVATIO) 20 MG tablet Take 1 tablet (20 mg total) by mouth 3 (three) times daily as needed. 90 tablet 6  . traMADol (ULTRAM) 50 MG tablet Take 1 tablet (50 mg total) by mouth every 6 (six) hours as needed. 60 tablet 1   No facility-administered medications prior to visit.      Per HPI unless specifically indicated in ROS section below Review of Systems  Constitutional: Positive for chills (6 wks ago, resolved). Negative for activity change, appetite change, fatigue, fever and unexpected weight change.  HENT: Negative for hearing loss.   Eyes: Negative for visual disturbance.  Respiratory: Negative for cough, chest tightness, shortness of breath and wheezing.   Cardiovascular: Negative for chest pain, palpitations and leg swelling.  Gastrointestinal: Positive for constipation. Negative for abdominal distention, abdominal pain, blood in stool, diarrhea, nausea and vomiting.  Genitourinary: Negative for difficulty urinating and hematuria.  Musculoskeletal: Positive for neck pain (intermittent R sided). Negative for arthralgias and myalgias.  Skin: Negative for rash.  Neurological: Negative for dizziness, seizures, syncope and headaches.  Hematological: Negative for adenopathy. Does not bruise/bleed easily.  Psychiatric/Behavioral: Negative for dysphoric mood. The patient is not nervous/anxious.         Objective:    BP 124/72 (BP Location: Left Arm, Patient Position: Sitting, Cuff Size: Large)   Pulse 71   Temp 97.9 F (36.6 C) (Oral)   Ht 5' 8.5" (1.74 m)   Wt 231 lb 8 oz (105 kg)   SpO2 95%   BMI 34.69 kg/m   Wt Readings from Last 3 Encounters:  04/03/18 231 lb 8 oz (105 kg)  03/06/18 232 lb 4 oz (105.3 kg)  09/26/17 261 lb 8 oz (118.6 kg)    Physical Exam  Constitutional: He is oriented to person, place, and time. He appears well-developed and well-nourished. No distress.  HENT:  Head: Normocephalic and atraumatic.  Right Ear: Hearing, tympanic membrane, external ear and ear canal normal.  Left Ear: Hearing, tympanic membrane, external ear and ear canal normal.  Nose: Nose normal.  Mouth/Throat: Uvula is midline, oropharynx is clear and moist and mucous membranes are normal. No oropharyngeal exudate, posterior oropharyngeal edema or posterior oropharyngeal erythema.  Eyes: Pupils are equal, round, and  reactive to light. Conjunctivae and EOM are normal. No scleral icterus.  Neck: Normal range of motion. Neck supple.  Cardiovascular: Normal rate, regular rhythm, normal heart sounds and intact distal pulses.  No murmur heard. Pulses:      Radial pulses are 2+ on the right side, and 2+ on the left side.  Pulmonary/Chest: Effort normal and breath sounds normal. No respiratory distress. He has no wheezes. He has no rales.  Abdominal: Soft. Bowel sounds are normal. He exhibits no distension and no mass. There is no tenderness. There is no rebound and no guarding.  Musculoskeletal: Normal range of motion. He exhibits no edema.  Lymphadenopathy:    He has no cervical adenopathy.  Neurological: He is alert and oriented to person, place, and time.  CN grossly intact, station and gait intact  Skin: Skin is warm and dry. No rash noted.  Psychiatric: He has a normal mood and affect. His behavior is normal. Judgment and thought content normal.  Nursing note and vitals  reviewed.  Results for orders placed or performed in visit on 03/27/18  IBC panel  Result Value Ref Range   Iron 76 42 - 165 ug/dL   Transferrin 310.0 212.0 - 360.0 mg/dL   Saturation Ratios 17.5 (L) 20.0 - 50.0 %  CBC with Differential/Platelet  Result Value Ref Range   WBC 6.4 4.0 - 10.5 K/uL   RBC 5.44 4.22 - 5.81 Mil/uL   Hemoglobin 16.3 13.0 - 17.0 g/dL   HCT 48.0 39.0 - 52.0 %   MCV 88.3 78.0 - 100.0 fl   MCHC 34.0 30.0 - 36.0 g/dL   RDW 14.3 11.5 - 15.5 %   Platelets 167.0 150.0 - 400.0 K/uL   Neutrophils Relative % 59.1 43.0 - 77.0 %   Lymphocytes Relative 22.2 12.0 - 46.0 %   Monocytes Relative 11.9 3.0 - 12.0 %   Eosinophils Relative 5.8 (H) 0.0 - 5.0 %   Basophils Relative 1.0 0.0 - 3.0 %   Neutro Abs 3.8 1.4 - 7.7 K/uL   Lymphs Abs 1.4 0.7 - 4.0 K/uL   Monocytes Absolute 0.8 0.1 - 1.0 K/uL   Eosinophils Absolute 0.4 0.0 - 0.7 K/uL   Basophils Absolute 0.1 0.0 - 0.1 K/uL  PSA  Result Value Ref Range   PSA 0.58 0.10 - 4.00 ng/mL  Hemoglobin A1c  Result Value Ref Range   Hgb A1c MFr Bld 6.4 4.6 - 6.5 %  Comprehensive metabolic panel  Result Value Ref Range   Sodium 140 135 - 145 mEq/L   Potassium 4.6 3.5 - 5.1 mEq/L   Chloride 102 96 - 112 mEq/L   CO2 29 19 - 32 mEq/L   Glucose, Bld 113 (H) 70 - 99 mg/dL   BUN 16 6 - 23 mg/dL   Creatinine, Ser 0.96 0.40 - 1.50 mg/dL   Total Bilirubin 0.6 0.2 - 1.2 mg/dL   Alkaline Phosphatase 42 39 - 117 U/L   AST 17 0 - 37 U/L   ALT 31 0 - 53 U/L   Total Protein 7.0 6.0 - 8.3 g/dL   Albumin 4.5 3.5 - 5.2 g/dL   Calcium 9.6 8.4 - 10.5 mg/dL   GFR 84.68 >60.00 mL/min  Lipid panel  Result Value Ref Range   Cholesterol 113 0 - 200 mg/dL   Triglycerides 97.0 0.0 - 149.0 mg/dL   HDL 37.70 (L) >39.00 mg/dL   VLDL 19.4 0.0 - 40.0 mg/dL   LDL Cholesterol 56 0 - 99 mg/dL  Total CHOL/HDL Ratio 3    NonHDL 75.73       Assessment & Plan:   Problem List Items Addressed This Visit    Obesity, Class I, BMI 30.0-34.9 (see  actual BMI)    Congratulated on weight loss to date 30 lbs! Pt motivated to continue healthy diet changes (low carb) and walking. Reassess at f/u in 6 months.      HYPERTENSION, BENIGN    Chronic, stable. Continue current regimen.       Hyperlipidemia    Chronic, stable on crestor and fish oil. LDL goal <70 The ASCVD Risk score Mikey Bussing DC Jr., et al., 2013) failed to calculate for the following reasons:   The valid total cholesterol range is 130 to 320 mg/dL       Health maintenance examination - Primary    Preventative protocols reviewed and updated unless pt declined. Discussed healthy diet and lifestyle.       Diverticulosis    WASP cipro/flagyl Rx through cards.       Diabetes mellitus type 2, controlled, with complications ([place])    Chronic, marked improvement with weight loss. Congratulated. Motivated for ongoing healthy diet.       Chronic pain of both shoulders    Sees ortho yearly with steroid injections into shoulders, takes OTC supplement online, takes tramadol for pain with hydrocodone sparingly for breakthrough pain. Discussed option of getting pain meds through our office. He will touch base with ortho and cards about this.       CAD (coronary artery disease)    Appreciate cards care. Avoid NSAIDs.        Other Visit Diagnoses    Need for influenza vaccination       Relevant Orders   Flu Vaccine QUAD 36+ mos IM (Completed)       No orders of the defined types were placed in this encounter.  Orders Placed This Encounter  Procedures  . Flu Vaccine QUAD 36+ mos IM    Follow up plan: Return if symptoms worsen or fail to improve, for follow up visit.  Ria Bush, MD

## 2018-04-03 NOTE — Assessment & Plan Note (Signed)
Congratulated on weight loss to date 30 lbs! Pt motivated to continue healthy diet changes (low carb) and walking. Reassess at f/u in 6 months.

## 2018-04-03 NOTE — Assessment & Plan Note (Signed)
Sees ortho yearly with steroid injections into shoulders, takes OTC supplement online, takes tramadol for pain with hydrocodone sparingly for breakthrough pain. Discussed option of getting pain meds through our office. He will touch base with ortho and cards about this.

## 2018-04-03 NOTE — Assessment & Plan Note (Addendum)
Chronic, stable on crestor and fish oil. LDL goal <70 The ASCVD Risk score Eric Bussing DC Jr., et al., 2013) failed to calculate for the following reasons:   The valid total cholesterol range is 130 to 320 mg/dL

## 2018-04-03 NOTE — Patient Instructions (Addendum)
Flu shot today You are doing well today Congratulations on weight loss! Keep up healthy diet chances! Return as needed or in 6 months for follow up visit.   Health Maintenance, Male A healthy lifestyle and preventive care is important for your health and wellness. Ask your health care provider about what schedule of regular examinations is right for you. What should I know about weight and diet? Eat a Healthy Diet  Eat plenty of vegetables, fruits, whole grains, low-fat dairy products, and lean protein.  Do not eat a lot of foods high in solid fats, added sugars, or salt.  Maintain a Healthy Weight Regular exercise can help you achieve or maintain a healthy weight. You should:  Do at least 150 minutes of exercise each week. The exercise should increase your heart rate and make you sweat (moderate-intensity exercise).  Do strength-training exercises at least twice a week.  Watch Your Levels of Cholesterol and Blood Lipids  Have your blood tested for lipids and cholesterol every 5 years starting at 61 years of age. If you are at high risk for heart disease, you should start having your blood tested when you are 61 years old. You may need to have your cholesterol levels checked more often if: ? Your lipid or cholesterol levels are high. ? You are older than 61 years of age. ? You are at high risk for heart disease.  What should I know about cancer screening? Many types of cancers can be detected early and may often be prevented. Lung Cancer  You should be screened every year for lung cancer if: ? You are a current smoker who has smoked for at least 30 years. ? You are a former smoker who has quit within the past 15 years.  Talk to your health care provider about your screening options, when you should start screening, and how often you should be screened.  Colorectal Cancer  Routine colorectal cancer screening usually begins at 61 years of age and should be repeated every 5-10  years until you are 61 years old. You may need to be screened more often if early forms of precancerous polyps or small growths are found. Your health care provider may recommend screening at an earlier age if you have risk factors for colon cancer.  Your health care provider may recommend using home test kits to check for hidden blood in the stool.  A small camera at the end of a tube can be used to examine your colon (sigmoidoscopy or colonoscopy). This checks for the earliest forms of colorectal cancer.  Prostate and Testicular Cancer  Depending on your age and overall health, your health care provider may do certain tests to screen for prostate and testicular cancer.  Talk to your health care provider about any symptoms or concerns you have about testicular or prostate cancer.  Skin Cancer  Check your skin from head to toe regularly.  Tell your health care provider about any new moles or changes in moles, especially if: ? There is a change in a mole's size, shape, or color. ? You have a mole that is larger than a pencil eraser.  Always use sunscreen. Apply sunscreen liberally and repeat throughout the day.  Protect yourself by wearing long sleeves, pants, a wide-brimmed hat, and sunglasses when outside.  What should I know about heart disease, diabetes, and high blood pressure?  If you are 53-37 years of age, have your blood pressure checked every 3-5 years. If you are 40 years  of age or older, have your blood pressure checked every year. You should have your blood pressure measured twice-once when you are at a hospital or clinic, and once when you are not at a hospital or clinic. Record the average of the two measurements. To check your blood pressure when you are not at a hospital or clinic, you can use: ? An automated blood pressure machine at a pharmacy. ? A home blood pressure monitor.  Talk to your health care provider about your target blood pressure.  If you are between  20-21 years old, ask your health care provider if you should take aspirin to prevent heart disease.  Have regular diabetes screenings by checking your fasting blood sugar level. ? If you are at a normal weight and have a low risk for diabetes, have this test once every three years after the age of 90. ? If you are overweight and have a high risk for diabetes, consider being tested at a younger age or more often.  A one-time screening for abdominal aortic aneurysm (AAA) by ultrasound is recommended for men aged 60-75 years who are current or former smokers. What should I know about preventing infection? Hepatitis B If you have a higher risk for hepatitis B, you should be screened for this virus. Talk with your health care provider to find out if you are at risk for hepatitis B infection. Hepatitis C Blood testing is recommended for:  Everyone born from [birth year] through 1964.  Anyone with known risk factors for hepatitis C.  Sexually Transmitted Diseases (STDs)  You should be screened each year for STDs including gonorrhea and chlamydia if: ? You are sexually active and are younger than 61 years of age. ? You are older than 61 years of age and your health care provider tells you that you are at risk for this type of infection. ? Your sexual activity has changed since you were last screened and you are at an increased risk for chlamydia or gonorrhea. Ask your health care provider if you are at risk.  Talk with your health care provider about whether you are at high risk of being infected with HIV. Your health care provider may recommend a prescription medicine to help prevent HIV infection.  What else can I do?  Schedule regular health, dental, and eye exams.  Stay current with your vaccines (immunizations).  Do not use any tobacco products, such as cigarettes, chewing tobacco, and e-cigarettes. If you need help quitting, ask your health care provider.  Limit alcohol intake to no more than  2 drinks per day. One drink equals 12 ounces of beer, 5 ounces of wine, or 1 ounces of hard liquor.  Do not use street drugs.  Do not share needles.  Ask your health care provider for help if you need support or information about quitting drugs.  Tell your health care provider if you often feel depressed.  Tell your health care provider if you have ever been abused or do not feel safe at home. This information is not intended to replace advice given to you by your health care provider. Make sure you discuss any questions you have with your health care provider. Document Released: 01/07/2008 Document Revised: 03/09/2016 Document Reviewed: 04/14/2015 Elsevier Interactive Patient Education  [employer].

## 2018-04-03 NOTE — Assessment & Plan Note (Signed)
WASP cipro/flagyl Rx through cards.

## 2018-04-03 NOTE — Assessment & Plan Note (Signed)
Chronic, marked improvement with weight loss. Congratulated. Motivated for ongoing healthy diet.

## 2018-04-03 NOTE — Assessment & Plan Note (Signed)
Preventative protocols reviewed and updated unless pt declined. Discussed healthy diet and lifestyle.  

## 2018-04-05 ENCOUNTER — Other Ambulatory Visit: Payer: Self-pay | Admitting: Cardiovascular Disease

## 2018-04-17 DIAGNOSIS — H40003 Preglaucoma, unspecified, bilateral: Secondary | ICD-10-CM | POA: Diagnosis not present

## 2018-04-24 DIAGNOSIS — E119 Type 2 diabetes mellitus without complications: Secondary | ICD-10-CM | POA: Diagnosis not present

## 2018-04-24 LAB — HM DIABETES EYE EXAM

## 2018-04-26 ENCOUNTER — Encounter: Payer: Self-pay | Admitting: Family Medicine

## 2018-05-04 ENCOUNTER — Other Ambulatory Visit: Payer: Self-pay | Admitting: Family Medicine

## 2018-05-04 ENCOUNTER — Other Ambulatory Visit: Payer: Self-pay | Admitting: Cardiovascular Disease

## 2018-06-05 DIAGNOSIS — M7541 Impingement syndrome of right shoulder: Secondary | ICD-10-CM | POA: Diagnosis not present

## 2018-06-05 DIAGNOSIS — M7542 Impingement syndrome of left shoulder: Secondary | ICD-10-CM | POA: Diagnosis not present

## 2018-06-06 ENCOUNTER — Telehealth: Payer: Self-pay | Admitting: Cardiovascular Disease

## 2018-06-06 DIAGNOSIS — I25118 Atherosclerotic heart disease of native coronary artery with other forms of angina pectoris: Secondary | ICD-10-CM

## 2018-06-06 NOTE — Telephone Encounter (Signed)
Patient needs DOT clearance form completed .  Placed in nurse box .  Please call when ready for pick up .

## 2018-06-07 NOTE — Telephone Encounter (Signed)
Patient dropped off paperwork to be done for his DOT clearance. Reviewed with patient that there is a chance he may need to complete some testing before we can sign these forms but that I would check with provider for further recommendations and that I would be in touch with him. He verbalized understanding with no further questions at this time.

## 2018-06-08 NOTE — Telephone Encounter (Signed)
Spoke with patient and reviewed that per DOT recommendations he will need exercise treadmill test and our machine is currently down. He requested to have it done then at [facility] and advised that I would need to call and see the scheduling options for that. He verbalized understanding with no further questions at this time.

## 2018-06-08 NOTE — Telephone Encounter (Signed)
Spoke with patient and reviewed that test can be done over at [facility] and provided him with instructions and the number to call and get that scheduled. He was appreciative for the call with no further questions.

## 2018-06-08 NOTE — Telephone Encounter (Signed)
Forms with information placed on Dr. Donivan Scull desk for his review.

## 2018-06-10 NOTE — Telephone Encounter (Signed)
Perhaps he can ask Marzetta Board who will be running the treadmill to drop it down in our office Or someone from our office could run and get it on Tuesday I could read it and signed the form on Tuesday thx TG

## 2018-06-12 ENCOUNTER — Ambulatory Visit
Admission: RE | Admit: 2018-06-12 | Discharge: 2018-06-12 | Disposition: A | Discharge status: Home / Self Care | Payer: BLUE CROSS/BLUE SHIELD | Source: Ambulatory Visit | Attending: Cardiovascular Disease | Admitting: Cardiovascular Disease

## 2018-06-12 DIAGNOSIS — I25118 Atherosclerotic heart disease of native coronary artery with other forms of angina pectoris: Secondary | ICD-10-CM

## 2018-06-12 LAB — EXERCISE TOLERANCE TEST
Estimated workload: 10.1 METS
Exercise duration (min): 8 min
Exercise duration (sec): 59 s
MPHR: 159 {beats}/min
Peak HR: 137 {beats}/min
Percent HR: 86 %
Rest HR: 70 {beats}/min

## 2018-06-12 NOTE — Telephone Encounter (Signed)
Left a message to call back. The paperwork is finished but still needs the patient's signature therefore unable to fax yet.

## 2018-06-12 NOTE — Telephone Encounter (Signed)
Stress test was completed 06/13/18. Paper work has been given to the provider for his signature and approval.

## 2018-06-13 NOTE — Telephone Encounter (Signed)
Signed forms have been given to the patient.

## 2018-06-13 NOTE — Telephone Encounter (Signed)
Patient coming to by office today to pick up form.

## 2018-07-12 ENCOUNTER — Other Ambulatory Visit: Payer: Self-pay | Admitting: Family Medicine

## 2018-07-13 NOTE — Telephone Encounter (Signed)
EpiPen Last OV:  04/03/18, CPE Next OV:  10/02/18, 6 mo f/u  Med not on current list. Pharmacy requests #2 device

## 2018-07-24 DIAGNOSIS — R0982 Postnasal drip: Secondary | ICD-10-CM | POA: Diagnosis not present

## 2018-07-24 DIAGNOSIS — J029 Acute pharyngitis, unspecified: Secondary | ICD-10-CM | POA: Diagnosis not present

## 2018-07-27 DIAGNOSIS — J019 Acute sinusitis, unspecified: Secondary | ICD-10-CM | POA: Diagnosis not present

## 2018-07-27 DIAGNOSIS — J301 Allergic rhinitis due to pollen: Secondary | ICD-10-CM | POA: Diagnosis not present

## 2018-08-21 ENCOUNTER — Other Ambulatory Visit: Payer: Self-pay | Admitting: Cardiovascular Disease

## 2018-09-24 ENCOUNTER — Other Ambulatory Visit: Payer: Self-pay | Admitting: Family Medicine

## 2018-09-24 DIAGNOSIS — E118 Type 2 diabetes mellitus with unspecified complications: Secondary | ICD-10-CM

## 2018-09-25 ENCOUNTER — Other Ambulatory Visit (INDEPENDENT_AMBULATORY_CARE_PROVIDER_SITE_OTHER): Payer: BLUE CROSS/BLUE SHIELD

## 2018-09-25 DIAGNOSIS — E118 Type 2 diabetes mellitus with unspecified complications: Secondary | ICD-10-CM | POA: Diagnosis not present

## 2018-09-25 LAB — BASIC METABOLIC PANEL
BUN: 14 mg/dL (ref 6–23)
CO2: 28 mEq/L (ref 19–32)
Calcium: 9.5 mg/dL (ref 8.4–10.5)
Chloride: 101 mEq/L (ref 96–112)
Creatinine, Ser: 0.9 mg/dL (ref 0.40–1.50)
GFR: 85.69 mL/min (ref >60.00)
Glucose, Bld: 135 mg/dL — ABNORMAL HIGH (ref 70–99)
Potassium: 4.3 mEq/L (ref 3.5–5.1)
Sodium: 137 mEq/L (ref 135–145)

## 2018-09-25 LAB — HEMOGLOBIN A1C: Hgb A1c MFr Bld: 6.5 % (ref 4.6–6.5)

## 2018-10-02 ENCOUNTER — Encounter: Payer: Self-pay | Admitting: Family Medicine

## 2018-10-02 ENCOUNTER — Other Ambulatory Visit: Payer: Self-pay | Admitting: Cardiovascular Disease

## 2018-10-02 ENCOUNTER — Ambulatory Visit (INDEPENDENT_AMBULATORY_CARE_PROVIDER_SITE_OTHER): Payer: BLUE CROSS/BLUE SHIELD | Admitting: Family Medicine

## 2018-10-02 VITALS — BP 140/72 | HR 78 | Temp 97.8°F | Ht 68.5 in | Wt 246.1 lb

## 2018-10-02 DIAGNOSIS — E118 Type 2 diabetes mellitus with unspecified complications: Secondary | ICD-10-CM

## 2018-10-02 NOTE — Assessment & Plan Note (Addendum)
Reviewed recent weight gain noted - which he attributes to holiday season. Pt motivated to continue working towards sustainable weight loss.

## 2018-10-02 NOTE — Patient Instructions (Signed)
Labs are looking good.  No med changes today. Renew efforts for weight loss.  Return in 6 months for physical.

## 2018-10-02 NOTE — Progress Notes (Signed)
BP 140/72 (BP Location: Left Arm, Patient Position: Sitting, Cuff Size: Large)   Pulse 78   Temp 97.8 F (36.6 C) (Oral)   Ht 5' 8.5" (1.74 m)   Wt 246 lb 1 oz (111.6 kg)   SpO2 93%   BMI 36.87 kg/m    CC: 6 mo DM f/u visit Subjective:    Patient ID: Eric [name], male    DOB: [birth year], 63 y.o.   MRN: [id number]  HPI: Eric [name] is a 63 y.o. male presenting on 10/02/2018 for Follow-up (Here for 6 mo f/u.)   15 lb weight gain noted. He is motivated to decrease weight.   Angioedema 2018 s/p allergy testing - soy and grain allergy managed with antihistamine. Carries epi pen.   DM - does not regularly check sugars. Compliant with antihyperglycemic regimen which includes: metformin 1500mg  daily. Denies low sugars or hypoglycemic symptoms. Denies paresthesias. Last diabetic eye exam 04/2018, sees q6 mo. Pneumovax: 2015. Prevnar: not due. Glucometer brand: doesn't have at home. DSME: declines. Lab Results  Component Value Date   HGBA1C 6.5 09/25/2018   Diabetic Foot Exam - Simple   Simple Foot Form Diabetic Foot exam was performed with the following findings:  Yes 10/02/2018 12:11 PM  Visual Inspection No deformities, no ulcerations, no other skin breakdown bilaterally:  Yes Sensation Testing Intact to touch and monofilament testing bilaterally:  Yes Pulse Check Posterior Tibialis and Dorsalis pulse intact bilaterally:  Yes Comments    Lab Results  Component Value Date   MICROALBUR <0.7 03/10/2015         Relevant past medical, surgical, family and social history reviewed and updated as indicated. Interim medical history since our last visit reviewed. Allergies and medications reviewed and updated. Outpatient Medications Prior to Visit  Medication Sig Dispense Refill  . aspirin EC 81 MG tablet Take 1 tablet (81 mg total) by mouth daily. 90 tablet 3  . ciprofloxacin (CIPRO) 500 MG tablet Take 1 tablet (500 mg total) by mouth 2 (two) times daily. (Patient  taking differently: Take 500 mg by mouth 2 (two) times daily. As needed for diverticulitis) 20 tablet 3  . clopidogrel (PLAVIX) 75 MG tablet TAKE 1 TABLET BY MOUTH ONCE DAILY 90 tablet 1  . Coenzyme Q10 200 MG capsule Take 200 mg by mouth daily.    . cyclobenzaprine (FLEXERIL) 10 MG tablet Take 1 tablet (10 mg total) by mouth 3 (three) times daily as needed for muscle spasms. 30 tablet 1  . diphenhydrAMINE (BENADRYL) 25 MG tablet Take 25 mg by mouth every 6 (six) hours as needed.    [name] Calcium (STOOL SOFTENER PO) Take by mouth daily.    [name] EPINEPHRINE 0.3 mg/0.3 mL IJ SOAJ injection INJECT INTRAMUSCULARLY AS DIRECTED 2 Device 1  . fexofenadine (ALLEGRA) 180 MG tablet Take 1 tablet (180 mg total) by mouth daily. 90 tablet 3  . hydrochlorothiazide (HYDRODIURIL) 25 MG tablet TAKE 1 TABLET BY MOUTH ONCE DAILY 90 tablet 1  . HYDROcodone-acetaminophen (NORCO) 5-325 MG tablet Take 1 tablet by mouth every 6 (six) hours as needed for moderate pain. 10 tablet 0  . losartan (COZAAR) 100 MG tablet TAKE 1 TABLET BY MOUTH ONCE DAILY 30 tablet 11  . metFORMIN (GLUCOPHAGE) 500 MG tablet TAKE 3 TABLET BY MOUTH EVERY MORNING WITH BREAKFAST 270 tablet 1  . Methylcellulose, Laxative, (CITRUCEL PO) Take by mouth. As needed    . metroNIDAZOLE (FLAGYL) 500 MG tablet Take 1 tablet (500 mg total) by  mouth 3 (three) times daily. (Patient taking differently: Take 500 mg by mouth 3 (three) times daily. As needed for diverticulitis) 30 tablet 3  . Multiple Vitamins-Minerals (CENTRUM SILVER) tablet Take 1 tablet by mouth daily.      . nitroGLYCERIN (NITROSTAT) 0.4 MG SL tablet PLACE 1 TABLET UNDER THE TONGUE IF NEEDED EVERY 5 MINUTES FOR CHEST PAIN FOR 3 DOSES IF NO RELIEF AFER FIRST DOSE CALL PRESCRIBER OR 911 25 tablet 3  . NON FORMULARY Vioprex 1,400 take two tablets daily for joints.    . Omega-3 Fatty Acids (FISH OIL CONCENTRATE) 1000 MG CAPS Take three tablets daily.    [name] omeprazole (PRILOSEC) 40 MG capsule TAKE 1  CAPSULE BY MOUTH ONCE DAILY 30 capsule 6  . ranitidine (ZANTAC) 300 MG tablet Take 1 tablet (300 mg total) by mouth daily. 90 tablet 3  . rosuvastatin (CRESTOR) 40 MG tablet TAKE 1 TABLET BY MOUTH ONCE DAILY 90 tablet 2  . sildenafil (REVATIO) 20 MG tablet Take 1 tablet (20 mg total) by mouth 3 (three) times daily as needed. 90 tablet 6  . traMADol (ULTRAM) 50 MG tablet Take 1 tablet (50 mg total) by mouth every 6 (six) hours as needed. 60 tablet 1  . dicyclomine (BENTYL) 20 MG tablet Take 1 tablet (20 mg total) by mouth 3 (three) times daily as needed for spasms. 30 tablet 0   No facility-administered medications prior to visit.      Per HPI unless specifically indicated in ROS section below Review of Systems Objective:    BP 140/72 (BP Location: Left Arm, Patient Position: Sitting, Cuff Size: Large)   Pulse 78   Temp 97.8 F (36.6 C) (Oral)   Ht 5' 8.5" (1.74 m)   Wt 246 lb 1 oz (111.6 kg)   SpO2 93%   BMI 36.87 kg/m   Wt Readings from Last 3 Encounters:  10/02/18 246 lb 1 oz (111.6 kg)  04/03/18 231 lb 8 oz (105 kg)  03/06/18 232 lb 4 oz (105.3 kg)    Physical Exam Vitals signs and nursing note reviewed.  Constitutional:      General: He is not in acute distress.    Appearance: Normal appearance. He is well-developed.  HENT:     Head: Normocephalic and atraumatic.     Right Ear: External ear normal.     Left Ear: External ear normal.     Nose: Nose normal.     Mouth/Throat:     Pharynx: No oropharyngeal exudate.  Eyes:     General: No scleral icterus.    Conjunctiva/sclera: Conjunctivae normal.     Pupils: Pupils are equal, round, and reactive to light.  Neck:     Musculoskeletal: Normal range of motion and neck supple.  Cardiovascular:     Rate and Rhythm: Normal rate and regular rhythm.     Pulses: Normal pulses.     Heart sounds: Normal heart sounds. No murmur.  Pulmonary:     Effort: Pulmonary effort is normal. No respiratory distress.     Breath sounds:  Normal breath sounds. No wheezing, rhonchi or rales.  Musculoskeletal:     Comments: See HPI for foot exam if done  Lymphadenopathy:     Cervical: No cervical adenopathy.  Skin:    General: Skin is warm and dry.     Findings: No rash.  Neurological:     Mental Status: He is alert.       Results for orders placed or performed in  visit on 09/25/18  Hemoglobin A1c  Result Value Ref Range   Hgb A1c MFr Bld 6.5 4.6 - 6.5 %  Basic metabolic panel  Result Value Ref Range   Sodium 137 135 - 145 mEq/L   Potassium 4.3 3.5 - 5.1 mEq/L   Chloride 101 96 - 112 mEq/L   CO2 28 19 - 32 mEq/L   Glucose, Bld 135 (H) 70 - 99 mg/dL   BUN 14 6 - 23 mg/dL   Creatinine, Ser 0.90 0.40 - 1.50 mg/dL   Calcium 9.5 8.4 - 10.5 mg/dL   GFR 85.69 >60.00 mL/min   Assessment & Plan:   Problem List Items Addressed This Visit    Severe obesity (BMI 35.0-39.9) with comorbidity ([place])    Reviewed recent weight gain noted - which he attributes to holiday season. Pt motivated to continue working towards sustainable weight loss.       Diabetes mellitus type 2, controlled, with complications ([place]) - Primary    Chronic, stable despite noted weight gain. Pt motivated to continue working towards sustainable weight loss. Declines DSME.           No orders of the defined types were placed in this encounter.  No orders of the defined types were placed in this encounter.   Follow up plan: Return in about 6 months (around 04/04/2019) for annual exam, prior fasting for blood work.  Ria Bush, MD

## 2018-10-02 NOTE — Assessment & Plan Note (Signed)
Chronic, stable despite noted weight gain. Pt motivated to continue working towards sustainable weight loss. Declines DSME.

## 2018-10-19 DIAGNOSIS — M7541 Impingement syndrome of right shoulder: Secondary | ICD-10-CM | POA: Diagnosis not present

## 2018-10-19 DIAGNOSIS — M7542 Impingement syndrome of left shoulder: Secondary | ICD-10-CM | POA: Diagnosis not present

## 2018-10-31 ENCOUNTER — Other Ambulatory Visit: Payer: Self-pay | Admitting: Family Medicine

## 2018-12-13 DIAGNOSIS — K5792 Diverticulitis of intestine, part unspecified, without perforation or abscess without bleeding: Secondary | ICD-10-CM | POA: Diagnosis not present

## 2018-12-13 DIAGNOSIS — Z79899 Other long term (current) drug therapy: Secondary | ICD-10-CM | POA: Diagnosis not present

## 2018-12-13 DIAGNOSIS — Z7982 Long term (current) use of aspirin: Secondary | ICD-10-CM | POA: Diagnosis not present

## 2018-12-13 DIAGNOSIS — Z7984 Long term (current) use of oral hypoglycemic drugs: Secondary | ICD-10-CM | POA: Diagnosis not present

## 2018-12-13 DIAGNOSIS — I2510 Atherosclerotic heart disease of native coronary artery without angina pectoris: Secondary | ICD-10-CM | POA: Diagnosis not present

## 2018-12-13 DIAGNOSIS — R1031 Right lower quadrant pain: Secondary | ICD-10-CM | POA: Diagnosis not present

## 2018-12-13 DIAGNOSIS — K5732 Diverticulitis of large intestine without perforation or abscess without bleeding: Secondary | ICD-10-CM | POA: Diagnosis not present

## 2018-12-13 DIAGNOSIS — I10 Essential (primary) hypertension: Secondary | ICD-10-CM | POA: Diagnosis not present

## 2018-12-13 DIAGNOSIS — E119 Type 2 diabetes mellitus without complications: Secondary | ICD-10-CM | POA: Diagnosis not present

## 2018-12-13 DIAGNOSIS — Z6836 Body mass index (BMI) 36.0-36.9, adult: Secondary | ICD-10-CM | POA: Diagnosis not present

## 2018-12-13 DIAGNOSIS — Z87891 Personal history of nicotine dependence: Secondary | ICD-10-CM | POA: Diagnosis not present

## 2018-12-13 DIAGNOSIS — Z955 Presence of coronary angioplasty implant and graft: Secondary | ICD-10-CM | POA: Diagnosis not present

## 2018-12-13 DIAGNOSIS — K37 Unspecified appendicitis: Secondary | ICD-10-CM | POA: Diagnosis not present

## 2018-12-13 DIAGNOSIS — Z7902 Long term (current) use of antithrombotics/antiplatelets: Secondary | ICD-10-CM | POA: Diagnosis not present

## 2018-12-14 DIAGNOSIS — K37 Unspecified appendicitis: Secondary | ICD-10-CM | POA: Diagnosis not present

## 2018-12-14 DIAGNOSIS — K5792 Diverticulitis of intestine, part unspecified, without perforation or abscess without bleeding: Secondary | ICD-10-CM | POA: Diagnosis not present

## 2018-12-31 ENCOUNTER — Other Ambulatory Visit: Payer: Self-pay | Admitting: Cardiovascular Disease

## 2018-12-31 ENCOUNTER — Telehealth: Payer: Self-pay | Admitting: Cardiovascular Disease

## 2018-12-31 NOTE — Telephone Encounter (Deleted)
Virtual Visit Pre-Appointment Phone Call  "(Name), I am calling you today to discuss your upcoming appointment. We are currently trying to limit exposure to the virus that causes COVID-19 by seeing patients at home rather than in the office."  1. "What is the BEST phone number to call the day of the visit?" - include this in appointment notes  2. Do you have or have access to (through a family member/friend) a smartphone with video capability that we can use for your visit?" a. If yes - list this number in appt notes as cell (if different from BEST phone #) and list the appointment type as a VIDEO visit in appointment notes b. If no - list the appointment type as a PHONE visit in appointment notes  3. Confirm consent - "In the setting of the current Covid19 crisis, you are scheduled for a (phone or video) visit with your provider on (date) at (time).  Just as we do with many in-office visits, in order for you to participate in this visit, we must obtain consent.  If you'd like, I can send this to your mychart (if signed up) or email for you to review.  Otherwise, I can obtain your verbal consent now.  All virtual visits are billed to your insurance company just like a normal visit would be.  By agreeing to a virtual visit, we'd like you to understand that the technology does not allow for your provider to perform an examination, and thus may limit your provider's ability to fully assess your condition. If your provider identifies any concerns that need to be evaluated in person, we will make arrangements to do so.  Finally, though the technology is pretty good, we cannot assure that it will always work on either your or our end, and in the setting of a video visit, we may have to convert it to a phone-only visit.  In either situation, we cannot ensure that we have a secure connection.  Are you willing to proceed?" STAFF: Did the patient verbally acknowledge consent to telehealth visit? Document  YES/NO here: Yes   4. Advise patient to be prepared - "Two hours prior to your appointment, go ahead and check your blood pressure, pulse, oxygen saturation, and your weight (if you have the equipment to check those) and write them all down. When your visit starts, your provider will ask you for this information. If you have an Apple Watch or Kardia device, please plan to have heart rate information ready on the day of your appointment. Please have a pen and paper handy nearby the day of the visit as well."  5. Give patient instructions for MyChart download to smartphone OR [email or web address] as below if video visit (depending on what platform provider is using)  6. Inform patient they will receive a phone call 15 minutes prior to their appointment time (may be from unknown caller ID) so they should be prepared to answer    TELEPHONE CALL NOTE  Eric [name] has been deemed a candidate for a follow-up tele-health visit to limit community exposure during the Covid-19 pandemic. I spoke with the patient via phone to ensure availability of phone/video source, confirm preferred email & phone number, and discuss instructions and expectations.  I reminded Eric [name] to be prepared with any vital sign and/or heart rhythm information that could potentially be obtained via home monitoring, at the time of his visit. I reminded Eric [name] to expect a phone call prior  to his visit.  Clarisse Gouge 12/31/2018 4:44 PM   INSTRUCTIONS FOR DOWNLOADING THE MYCHART APP TO SMARTPHONE  - The patient must first make sure to have activated MyChart and know their login information - If Apple, go to [employer] and type in MyChart in the search bar and download the app. If Android, ask patient to go to [employer] and type in [place] in the search bar and download the app. The app is free but as with any other app downloads, their phone may require them to verify saved payment information or  Apple/Android password.  - The patient will need to then log into the app with their MyChart username and password, and select  as their healthcare provider to link the account. When it is time for your visit, go to the MyChart app, find appointments, and click Begin Video Visit. Be sure to Select Allow for your device to access the Microphone and Camera for your visit. You will then be connected, and your provider will be with you shortly.  **If they have any issues connecting, or need assistance please contact MyChart service desk [phone])**  **If using a computer, in order to ensure the best quality for their visit they will need to use either of the following Internet Browsers: [employer], or Google Chrome**  IF USING DOXIMITY or DOXY.ME - The patient will receive a link just prior to their visit by text.     FULL LENGTH CONSENT FOR TELE-HEALTH VISIT   I hereby voluntarily request, consent and authorize [facility] and its employed or contracted physicians, physician assistants, nurse practitioners or other licensed health care professionals (the Practitioner), to provide me with telemedicine health care services (the Services") as deemed necessary by the treating Practitioner. I acknowledge and consent to receive the Services by the Practitioner via telemedicine. I understand that the telemedicine visit will involve communicating with the Practitioner through live audiovisual communication technology and the disclosure of certain medical information by electronic transmission. I acknowledge that I have been given the opportunity to request an in-person assessment or other available alternative prior to the telemedicine visit and am voluntarily participating in the telemedicine visit.  I understand that I have the right to withhold or withdraw my consent to the use of telemedicine in the course of my care at any time, without affecting my right to future care  or treatment, and that the Practitioner or I may terminate the telemedicine visit at any time. I understand that I have the right to inspect all information obtained and/or recorded in the course of the telemedicine visit and may receive copies of available information for a reasonable fee.  I understand that some of the potential risks of receiving the Services via telemedicine include:   Delay or interruption in medical evaluation due to technological equipment failure or disruption;  Information transmitted may not be sufficient (e.g. poor resolution of images) to allow for appropriate medical decision making by the Practitioner; and/or   In rare instances, security protocols could fail, causing a breach of personal health information.  Furthermore, I acknowledge that it is my responsibility to provide information about my medical history, conditions and care that is complete and accurate to the best of my ability. I acknowledge that Practitioner's advice, recommendations, and/or decision may be based on factors not within their control, such as incomplete or inaccurate data provided by me or distortions of diagnostic images or specimens that may result from electronic transmissions.  I understand that the practice of medicine is not an exact science and that Practitioner makes no warranties or guarantees regarding treatment outcomes. I acknowledge that I will receive a copy of this consent concurrently upon execution via email to the email address I last provided but may also request a printed copy by calling the office of [facility].    I understand that my insurance will be billed for this visit.   I have read or had this consent read to me.  I understand the contents of this consent, which adequately explains the benefits and risks of the Services being provided via telemedicine.   I have been provided ample opportunity to ask questions regarding this consent and the Services and have had  my questions answered to my satisfaction.  I give my informed consent for the services to be provided through the use of telemedicine in my medical care  By participating in this telemedicine visit I agree to the above.

## 2018-12-31 NOTE — Telephone Encounter (Signed)
Patient declined visit at this time as he is friends with Dr. Rockey Situ and will see if he can do a house call visit   Patient will call back If needed.

## 2019-01-02 ENCOUNTER — Telehealth: Payer: Self-pay

## 2019-01-02 NOTE — Telephone Encounter (Signed)
Alternative is famotidine, Pepcid He can buy this over-the-counter or we can send in prescription 20 mg twice daily Maximum dose would be 40 mg twice daily

## 2019-01-02 NOTE — Telephone Encounter (Signed)
Incoming fax from [employer] [facility]:  " Patient needs an Alternative medication for RANITIDINE 300MG . Please send [place] new RX. Patient is having really bad symptoms. Alt is Pepcid"

## 2019-01-02 NOTE — Telephone Encounter (Signed)
Message fwd to Dr.Gollan to adv on alternative medication

## 2019-01-04 MED ORDER — FAMOTIDINE 20 MG PO TABS: 20.0000 mg | ORAL_TABLET | Freq: Two times a day (BID) | ORAL | 3 refills | Status: DC

## 2019-01-04 NOTE — Telephone Encounter (Signed)
Spoke with patient and reviewed recommendations by provider and he requested that I please send in prescription because it would be cheaper. So sent in prescription and he was appreciative for the call and had no further questions at this time.

## 2019-01-04 NOTE — Addendum Note (Signed)
Addended by: Valora Corporal on: 01/04/2019 05:56 PM   Modules accepted: Orders

## 2019-01-29 ENCOUNTER — Other Ambulatory Visit: Payer: Self-pay | Admitting: Cardiovascular Disease

## 2019-03-05 DIAGNOSIS — H40003 Preglaucoma, unspecified, bilateral: Secondary | ICD-10-CM | POA: Diagnosis not present

## 2019-04-09 ENCOUNTER — Telehealth: Payer: Self-pay

## 2019-04-09 MED ORDER — HYDROCHLOROTHIAZIDE 25 MG PO TABS: 25.0000 mg | ORAL_TABLET | Freq: Every day | ORAL | 0 refills | Status: DC

## 2019-04-09 MED ORDER — LOSARTAN POTASSIUM 100 MG PO TABS: 100.0000 mg | ORAL_TABLET | Freq: Every day | ORAL | 0 refills | Status: DC

## 2019-04-09 NOTE — Telephone Encounter (Addendum)
Patient needs an appointment for refills, may check with PCP for fexofenadine.

## 2019-04-09 NOTE — Addendum Note (Signed)
Addended by: Alba Destine on: 04/09/2019 08:48 AM   Modules accepted: Orders

## 2019-04-23 ENCOUNTER — Other Ambulatory Visit (INDEPENDENT_AMBULATORY_CARE_PROVIDER_SITE_OTHER): Payer: BC Managed Care – PPO

## 2019-04-23 ENCOUNTER — Other Ambulatory Visit: Payer: Self-pay | Admitting: Family Medicine

## 2019-04-23 ENCOUNTER — Telehealth: Payer: Self-pay | Admitting: Cardiovascular Disease

## 2019-04-23 DIAGNOSIS — E782 Mixed hyperlipidemia: Secondary | ICD-10-CM

## 2019-04-23 DIAGNOSIS — E118 Type 2 diabetes mellitus with unspecified complications: Secondary | ICD-10-CM

## 2019-04-23 DIAGNOSIS — Z125 Encounter for screening for malignant neoplasm of prostate: Secondary | ICD-10-CM | POA: Diagnosis not present

## 2019-04-23 LAB — COMPREHENSIVE METABOLIC PANEL
ALT: 51 U/L (ref 0–53)
AST: 34 U/L (ref 0–37)
Albumin: 4.4 g/dL (ref 3.5–5.2)
Alkaline Phosphatase: 48 U/L (ref 39–117)
BUN: 9 mg/dL (ref 6–23)
CO2: 27 mEq/L (ref 19–32)
Calcium: 9.6 mg/dL (ref 8.4–10.5)
Chloride: 103 mEq/L (ref 96–112)
Creatinine, Ser: 0.91 mg/dL (ref 0.40–1.50)
GFR: 84.44 mL/min (ref >60.00)
Glucose, Bld: 130 mg/dL — ABNORMAL HIGH (ref 70–99)
Potassium: 4.4 mEq/L (ref 3.5–5.1)
Sodium: 139 mEq/L (ref 135–145)
Total Bilirubin: 0.6 mg/dL (ref 0.2–1.2)
Total Protein: 7 g/dL (ref 6.0–8.3)

## 2019-04-23 LAB — LIPID PANEL
Cholesterol: 93 mg/dL (ref 0–200)
HDL: 33.6 mg/dL — ABNORMAL LOW (ref >39.00)
LDL Cholesterol: 38 mg/dL (ref 0–99)
NonHDL: 59.41
Total CHOL/HDL Ratio: 3
Triglycerides: 107 mg/dL (ref 0.0–149.0)
VLDL: 21.4 mg/dL (ref 0.0–40.0)

## 2019-04-23 LAB — PSA: PSA: 0.66 ng/mL (ref 0.10–4.00)

## 2019-04-23 LAB — HEMOGLOBIN A1C: Hgb A1c MFr Bld: 7.1 % — ABNORMAL HIGH (ref 4.6–6.5)

## 2019-04-23 NOTE — Telephone Encounter (Signed)
Pharmacist calling in wanting to know reason of denial for allergy medication. States that patient has received refills for this medication from Dr. Rockey Situ before but it is not coming up as "declined" please advise

## 2019-04-23 NOTE — Telephone Encounter (Signed)
°*  STAT* If patient is at the pharmacy, call can be transferred to refill team.   1. Which medications need to be refilled? (please list name of each medication and dose if known)   fexofenadine.  2. Which pharmacy/location (including street and city if local pharmacy) is medication to be sent to?    [facility] [address] at [facility]   3. Do they need a 30 day or 90 day supply? [place]

## 2019-04-23 NOTE — Telephone Encounter (Signed)
Please see note below. 
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
## 2019-04-30 ENCOUNTER — Encounter: Payer: Self-pay | Admitting: Cardiovascular Disease

## 2019-04-30 ENCOUNTER — Other Ambulatory Visit: Payer: Self-pay

## 2019-04-30 ENCOUNTER — Other Ambulatory Visit: Payer: BLUE CROSS/BLUE SHIELD

## 2019-04-30 ENCOUNTER — Ambulatory Visit (INDEPENDENT_AMBULATORY_CARE_PROVIDER_SITE_OTHER): Payer: BC Managed Care – PPO | Admitting: Cardiovascular Disease

## 2019-04-30 VITALS — BP 128/70 | HR 74 | Wt 245.2 lb

## 2019-04-30 DIAGNOSIS — I10 Essential (primary) hypertension: Secondary | ICD-10-CM | POA: Diagnosis not present

## 2019-04-30 DIAGNOSIS — E782 Mixed hyperlipidemia: Secondary | ICD-10-CM | POA: Diagnosis not present

## 2019-04-30 DIAGNOSIS — E118 Type 2 diabetes mellitus with unspecified complications: Secondary | ICD-10-CM

## 2019-04-30 DIAGNOSIS — I25118 Atherosclerotic heart disease of native coronary artery with other forms of angina pectoris: Secondary | ICD-10-CM

## 2019-04-30 DIAGNOSIS — K5790 Diverticulosis of intestine, part unspecified, without perforation or abscess without bleeding: Secondary | ICD-10-CM

## 2019-04-30 MED ORDER — OMEPRAZOLE 40 MG PO CPDR: 40.0000 mg | DELAYED_RELEASE_CAPSULE | Freq: Every day | ORAL | 3 refills | Status: DC

## 2019-04-30 MED ORDER — CYCLOBENZAPRINE HCL 10 MG PO TABS: 10.0000 mg | ORAL_TABLET | Freq: Three times a day (TID) | ORAL | 1 refills | Status: DC | PRN

## 2019-04-30 MED ORDER — SILDENAFIL CITRATE 20 MG PO TABS: 20.0000 mg | ORAL_TABLET | Freq: Three times a day (TID) | ORAL | 6 refills | Status: DC | PRN

## 2019-04-30 MED ORDER — LOSARTAN POTASSIUM 100 MG PO TABS: 100.0000 mg | ORAL_TABLET | Freq: Every day | ORAL | 4 refills | Status: DC

## 2019-04-30 MED ORDER — ROSUVASTATIN CALCIUM 40 MG PO TABS: 40.0000 mg | ORAL_TABLET | Freq: Every day | ORAL | 4 refills | Status: DC

## 2019-04-30 MED ORDER — HYDROCHLOROTHIAZIDE 25 MG PO TABS: 25.0000 mg | ORAL_TABLET | Freq: Every day | ORAL | 4 refills | Status: DC

## 2019-04-30 MED ORDER — FEXOFENADINE HCL 180 MG PO TABS: 180.0000 mg | ORAL_TABLET | Freq: Every day | ORAL | 4 refills | Status: DC

## 2019-04-30 MED ORDER — TRAMADOL HCL 50 MG PO TABS: 50.0000 mg | ORAL_TABLET | Freq: Four times a day (QID) | ORAL | 3 refills | Status: DC | PRN

## 2019-04-30 MED ORDER — CLOPIDOGREL BISULFATE 75 MG PO TABS: 75.0000 mg | ORAL_TABLET | Freq: Every day | ORAL | 4 refills | Status: DC

## 2019-04-30 NOTE — Patient Instructions (Signed)

## 2019-05-07 ENCOUNTER — Ambulatory Visit (INDEPENDENT_AMBULATORY_CARE_PROVIDER_SITE_OTHER): Payer: BC Managed Care – PPO | Admitting: Family Medicine

## 2019-05-07 ENCOUNTER — Encounter: Payer: Self-pay | Admitting: Family Medicine

## 2019-05-07 ENCOUNTER — Other Ambulatory Visit: Payer: Self-pay | Admitting: Family Medicine

## 2019-05-07 ENCOUNTER — Other Ambulatory Visit: Payer: Self-pay

## 2019-05-07 VITALS — BP 122/74 | HR 76 | Temp 97.1°F | Resp 18 | Ht 68.75 in | Wt 246.4 lb

## 2019-05-07 DIAGNOSIS — Z0000 Encounter for general adult medical examination without abnormal findings: Secondary | ICD-10-CM

## 2019-05-07 DIAGNOSIS — E1169 Type 2 diabetes mellitus with other specified complication: Secondary | ICD-10-CM | POA: Diagnosis not present

## 2019-05-07 DIAGNOSIS — E118 Type 2 diabetes mellitus with unspecified complications: Secondary | ICD-10-CM | POA: Diagnosis not present

## 2019-05-07 DIAGNOSIS — E785 Hyperlipidemia, unspecified: Secondary | ICD-10-CM

## 2019-05-07 DIAGNOSIS — I10 Essential (primary) hypertension: Secondary | ICD-10-CM

## 2019-05-07 DIAGNOSIS — K5790 Diverticulosis of intestine, part unspecified, without perforation or abscess without bleeding: Secondary | ICD-10-CM | POA: Diagnosis not present

## 2019-05-07 MED ORDER — CIPROFLOXACIN HCL 500 MG PO TABS: 500.0000 mg | ORAL_TABLET | Freq: Two times a day (BID) | ORAL | 0 refills | Status: DC

## 2019-05-07 MED ORDER — METRONIDAZOLE 500 MG PO TABS: 500.0000 mg | ORAL_TABLET | Freq: Three times a day (TID) | ORAL | 0 refills | Status: DC

## 2019-05-07 NOTE — Assessment & Plan Note (Signed)
Chronic, stable on current regimen - continue. The ASCVD Risk score Mikey Bussing DC Jr., et al., 2013) failed to calculate for the following reasons:   The valid total cholesterol range is 130 to 320 mg/dL

## 2019-05-07 NOTE — Assessment & Plan Note (Signed)
Preventative protocols reviewed and updated unless pt declined. Discussed healthy diet and lifestyle.  

## 2019-05-07 NOTE — Assessment & Plan Note (Signed)
Continue to encourage healthy diet and lifestyle changes to affect sustainable weight loss.  

## 2019-05-07 NOTE — Assessment & Plan Note (Signed)
Chronic, mild deterioration. Encouraged renewed efforts at diabetic diet.

## 2019-05-07 NOTE — Assessment & Plan Note (Signed)
Chronic, stable. Continue current regimen. 

## 2019-05-07 NOTE — Patient Instructions (Addendum)
Sugar was elevated - work on low sugar low carb diet to control diabetes.  Return as needed or in 6 months for diabetes follow up visit.   Health Maintenance, Male Adopting a healthy lifestyle and getting preventive care are important in promoting health and wellness. Ask your health care provider about:  The right schedule for you to have regular tests and exams.  Things you can do on your own to prevent diseases and keep yourself healthy. What should I know about diet, weight, and exercise? Eat a healthy diet   Eat a diet that includes plenty of vegetables, fruits, low-fat dairy products, and lean protein.  Do not eat a lot of foods that are high in solid fats, added sugars, or sodium. Maintain a healthy weight Body mass index (BMI) is a measurement that can be used to identify possible weight problems. It estimates body fat based on height and weight. Your health care provider can help determine your BMI and help you achieve or maintain a healthy weight. Get regular exercise Get regular exercise. This is one of the most important things you can do for your health. Most adults should:  Exercise for at least 150 minutes each week. The exercise should increase your heart rate and make you sweat (moderate-intensity exercise).  Do strengthening exercises at least twice a week. This is in addition to the moderate-intensity exercise.  Spend less time sitting. Even light physical activity can be beneficial. Watch cholesterol and blood lipids Have your blood tested for lipids and cholesterol at 62 years of age, then have this test every 5 years. You may need to have your cholesterol levels checked more often if:  Your lipid or cholesterol levels are high.  You are older than 62 years of age.  You are at high risk for heart disease. What should I know about cancer screening? Many types of cancers can be detected early and may often be prevented. Depending on your health history and  family history, you may need to have cancer screening at various ages. This may include screening for:  Colorectal cancer.  Prostate cancer.  Skin cancer.  Lung cancer. What should I know about heart disease, diabetes, and high blood pressure? Blood pressure and heart disease  High blood pressure causes heart disease and increases the risk of stroke. This is more likely to develop in people who have high blood pressure readings, are of African descent, or are overweight.  Talk with your health care provider about your target blood pressure readings.  Have your blood pressure checked: ? Every 3-5 years if you are 98-70 years of age. ? Every year if you are 57 years old or older.  If you are between the ages of 56 and 107 and are a current or former smoker, ask your health care provider if you should have a one-time screening for abdominal aortic aneurysm (AAA). Diabetes Have regular diabetes screenings. This checks your fasting blood sugar level. Have the screening done:  Once every three years after age 79 if you are at a normal weight and have a low risk for diabetes.  More often and at a younger age if you are overweight or have a high risk for diabetes. What should I know about preventing infection? Hepatitis B If you have a higher risk for hepatitis B, you should be screened for this virus. Talk with your health care provider to find out if you are at risk for hepatitis B infection. Hepatitis C Blood testing is  recommended for:  Everyone born from [birth year] through 1964.  Anyone with known risk factors for hepatitis C. Sexually transmitted infections (STIs)  You should be screened each year for STIs, including gonorrhea and chlamydia, if: ? You are sexually active and are younger than 62 years of age. ? You are older than 62 years of age and your health care provider tells you that you are at risk for this type of infection. ? Your sexual activity has changed since you were  last screened, and you are at increased risk for chlamydia or gonorrhea. Ask your health care provider if you are at risk.  Ask your health care provider about whether you are at high risk for HIV. Your health care provider may recommend a prescription medicine to help prevent HIV infection. If you choose to take medicine to prevent HIV, you should first get tested for HIV. You should then be tested every 3 months for as long as you are taking the medicine. Follow these instructions at home: Lifestyle  Do not use any products that contain nicotine or tobacco, such as cigarettes, e-cigarettes, and chewing tobacco. If you need help quitting, ask your health care provider.  Do not use street drugs.  Do not share needles.  Ask your health care provider for help if you need support or information about quitting drugs. Alcohol use  Do not drink alcohol if your health care provider tells you not to drink.  If you drink alcohol: ? Limit how much you have to 0-2 drinks a day. ? Be aware of how much alcohol is in your drink. In the [place] one drink equals one 12 oz bottle of beer (355 mL), one 5 oz glass of wine (148 mL), or one 1 oz glass of hard liquor (44 mL). General instructions  Schedule regular health, dental, and eye exams.  Stay current with your vaccines.  Tell your health care provider if: ? You often feel depressed. ? You have ever been abused or do not feel safe at home. Summary  Adopting a healthy lifestyle and getting preventive care are important in promoting health and wellness.  Follow your health care provider's instructions about healthy diet, exercising, and getting tested or screened for diseases.  Follow your health care provider's instructions on monitoring your cholesterol and blood pressure. This information is not intended to replace advice given to you by your health care provider. Make sure you discuss any questions you have with your health care  provider. Document Released: 01/07/2008 Document Revised: 07/04/2018 Document Reviewed: 07/04/2018 Elsevier Patient Education  2020 [employer].

## 2019-05-07 NOTE — Assessment & Plan Note (Signed)
With diverticulitis about once a year. Requests abx WASP - filled. Reviewed indications to start abx as well as need to notify us if he does start med for close monitoring. Reviewed complications of diverticulitis.

## 2019-05-07 NOTE — Progress Notes (Signed)
This visit was conducted in person.  BP 122/74 (BP Location: Left Arm)   Pulse 76   Temp (!) 97.1 F (36.2 C)   Resp 18   Ht 5' 8.75" (1.746 m)   Wt 246 lb 6 oz (111.8 kg)   SpO2 95%   BMI 36.65 kg/m    CC: CPE Subjective:    Patient ID: Eric [name], male    DOB: [birth year], 63 y.o.   MRN: [id number]  HPI: [name] is a 63 y.o. male presenting on 05/07/2019 for Annual Exam   Episode of diverticulitis ~10/2018 treated with abx. Thinks horseradish sauce caused flare. Requests refill of abx to have on hand.   Preventative: COLONOSCOPY WITH PROPOFOL;03/14/2016;TAx1, diverticulosis, rpt 5 yrs; Lollie Sails, MD Prostate cancer screening - has had normal in past.  Lung cancer screening - not eligible Flu shot yearly  Pneumovax 03/10/2014 [facility]) Tetanus 2007, Tdap 2016 zostavax 07/2013 shingrix - completed 2019 Seat belt use discussed  Sunscreen use discussed, no changing moles on skin Ex smoker - quit 03/2007. Smoked 1ppd for 20 yrs. ~20 PY hx Alcohol - averages 5-10 beers/wk. No hangover.  Dentist Q6 mo  Eye exam Q6 mo  Lives with brother  Occupation: truck Geophysicist/field seismologist, stocks stores  Activity: started walking  Diet: good water, fruits/vegetables some - healthier eating choices     Relevant past medical, surgical, family and social history reviewed and updated as indicated. Interim medical history since our last visit reviewed. Allergies and medications reviewed and updated. Outpatient Medications Prior to Visit  Medication Sig Dispense Refill  . Ascorbic Acid (VITAMIN C) 1000 MG tablet Take 1,000 mg by mouth daily.    [name] aspirin EC 81 MG tablet Take 1 tablet (81 mg total) by mouth daily. 90 tablet 3  . cholecalciferol (VITAMIN D3) 25 MCG (1000 UT) tablet Take 1,000 Units by mouth daily.    . clopidogrel (PLAVIX) 75 MG tablet Take 1 tablet (75 mg total) by mouth daily. 90 tablet 4  . Coenzyme Q10 200 MG capsule Take 200 mg by mouth daily.    .  cyclobenzaprine (FLEXERIL) 10 MG tablet Take 1 tablet (10 mg total) by mouth 3 (three) times daily as needed for muscle spasms. 30 tablet 1  . diphenhydrAMINE (BENADRYL) 25 MG tablet Take 25 mg by mouth every 6 (six) hours as needed.    [name] Calcium (STOOL SOFTENER PO) Take by mouth daily.    [name] EPINEPHRINE 0.3 mg/0.3 mL IJ SOAJ injection INJECT INTRAMUSCULARLY AS DIRECTED 2 Device 1  . famotidine (PEPCID) 20 MG tablet Take 1 tablet (20 mg total) by mouth 2 (two) times daily. 180 tablet 3  . fexofenadine (ALLEGRA) 180 MG tablet Take 1 tablet (180 mg total) by mouth daily. 90 tablet 4  . hydrochlorothiazide (HYDRODIURIL) 25 MG tablet Take 1 tablet (25 mg total) by mouth daily. 90 tablet 4  . HYDROcodone-acetaminophen (NORCO) 5-325 MG tablet Take 1 tablet by mouth every 6 (six) hours as needed for moderate pain. 10 tablet 0  . losartan (COZAAR) 100 MG tablet Take 1 tablet (100 mg total) by mouth daily. 90 tablet 4  . metFORMIN (GLUCOPHAGE) 500 MG tablet TAKE 3 TABLETS BY MOUTH EVERY MORNING WITH BREAKFAST 270 tablet 1  . Methylcellulose, Laxative, (CITRUCEL PO) Take by mouth. As needed    . Multiple Vitamins-Minerals (CENTRUM SILVER) tablet Take 1 tablet by mouth daily.      . nitroGLYCERIN (NITROSTAT) 0.4 MG SL tablet PLACE  1 TABLET UNDER THE TONGUE IF NEEDED EVERY 5 MINUTES FOR CHEST PAIN FOR 3 DOSES IF NO RELIEF AFER FIRST DOSE CALL PRESCRIBER OR 911 25 tablet 3  . NON FORMULARY Vioprex 1,400 take two tablets daily for joints.    . Omega-3 Fatty Acids (FISH OIL CONCENTRATE) 1000 MG CAPS Take three tablets daily.    [name] omeprazole (PRILOSEC) 40 MG capsule Take 1 capsule (40 mg total) by mouth daily. 90 capsule 3  . rosuvastatin (CRESTOR) 40 MG tablet Take 1 tablet (40 mg total) by mouth daily. 90 tablet 4  . sildenafil (REVATIO) 20 MG tablet Take 1 tablet (20 mg total) by mouth 3 (three) times daily as needed. 90 tablet 6  . traMADol (ULTRAM) 50 MG tablet Take 1 tablet (50 mg total) by mouth  every 6 (six) hours as needed. 25 tablet 3  . ciprofloxacin (CIPRO) 500 MG tablet Take 1 tablet (500 mg total) by mouth 2 (two) times daily. (Patient not taking: Reported on 05/07/2019) 20 tablet 3  . metroNIDAZOLE (FLAGYL) 500 MG tablet Take 1 tablet (500 mg total) by mouth 3 (three) times daily. (Patient not taking: Reported on 05/07/2019) 30 tablet 3   No facility-administered medications prior to visit.      Per HPI unless specifically indicated in ROS section below Review of Systems  Constitutional: Negative for activity change, appetite change, chills, fatigue, fever and unexpected weight change.  HENT: Negative for hearing loss.   Eyes: Negative for visual disturbance.  Respiratory: Negative for cough, chest tightness, shortness of breath and wheezing.   Cardiovascular: Negative for chest pain, palpitations and leg swelling.  Gastrointestinal: Negative for abdominal distention, abdominal pain, blood in stool, constipation, diarrhea, nausea and vomiting.  Genitourinary: Negative for difficulty urinating and hematuria.  Musculoskeletal: Negative for arthralgias, myalgias and neck pain.  Skin: Negative for rash.  Neurological: Negative for dizziness, seizures, syncope and headaches.  Hematological: Negative for adenopathy. Does not bruise/bleed easily.  Psychiatric/Behavioral: Negative for dysphoric mood. The patient is not nervous/anxious.    Objective:    BP 122/74 (BP Location: Left Arm)   Pulse 76   Temp (!) 97.1 F (36.2 C)   Resp 18   Ht 5' 8.75" (1.746 m)   Wt 246 lb 6 oz (111.8 kg)   SpO2 95%   BMI 36.65 kg/m   Wt Readings from Last 3 Encounters:  05/07/19 246 lb 6 oz (111.8 kg)  04/30/19 245 lb 4 oz (111.2 kg)  10/02/18 246 lb 1 oz (111.6 kg)    Physical Exam Vitals signs and nursing note reviewed.  Constitutional:      General: He is not in acute distress.    Appearance: Normal appearance. He is well-developed. He is not ill-appearing.  HENT:     Head:  Normocephalic and atraumatic.     [name] Ear: Hearing, tympanic membrane, ear canal and external ear normal.     Left Ear: Hearing, tympanic membrane, ear canal and external ear normal.     Nose: Nose normal.     Mouth/Throat:     Mouth: Mucous membranes are moist.     Pharynx: Oropharynx is clear. Uvula midline. No posterior oropharyngeal erythema.  Eyes:     General: No scleral icterus.    Conjunctiva/sclera: Conjunctivae normal.     Pupils: Pupils are equal, round, and reactive to light.  Neck:     Musculoskeletal: Normal range of motion and neck supple.  Cardiovascular:     Rate and Rhythm: Normal rate  and regular rhythm.     Pulses: Normal pulses.          Radial pulses are 2+ on the [name] side and 2+ on the left side.     Heart sounds: Normal heart sounds. No murmur.  Pulmonary:     Effort: Pulmonary effort is normal. No respiratory distress.     Breath sounds: Normal breath sounds. No wheezing, rhonchi or rales.  Abdominal:     General: Abdomen is flat. Bowel sounds are normal. There is no distension.     Palpations: Abdomen is soft. There is no mass.     Tenderness: There is no abdominal tenderness. There is no guarding or rebound.     Hernia: No hernia is present.  Musculoskeletal: Normal range of motion.     [name] lower leg: No edema.     Left lower leg: No edema.  Lymphadenopathy:     Cervical: No cervical adenopathy.  Skin:    General: Skin is warm and dry.     Findings: No rash.  Neurological:     General: No focal deficit present.     Mental Status: He is alert and oriented to person, place, and time.     Comments: CN grossly intact, station and gait intact  Psychiatric:        Mood and Affect: Mood normal.        Behavior: Behavior normal.        Thought Content: Thought content normal.        Judgment: Judgment normal.       Results for orders placed or performed in visit on 04/23/19  PSA  Result Value Ref Range   PSA 0.66 0.10 - 4.00 ng/mL  Lipid  panel  Result Value Ref Range   Cholesterol 93 0 - 200 mg/dL   Triglycerides 107.0 0.0 - 149.0 mg/dL   HDL 33.60 (L) >39.00 mg/dL   VLDL 21.4 0.0 - 40.0 mg/dL   LDL Cholesterol 38 0 - 99 mg/dL   Total CHOL/HDL Ratio 3    NonHDL 59.41   Hemoglobin A1c  Result Value Ref Range   Hgb A1c MFr Bld 7.1 (H) 4.6 - 6.5 %  Comprehensive metabolic panel  Result Value Ref Range   Sodium 139 135 - 145 mEq/L   Potassium 4.4 3.5 - 5.1 mEq/L   Chloride 103 96 - 112 mEq/L   CO2 27 19 - 32 mEq/L   Glucose, Bld 130 (H) 70 - 99 mg/dL   BUN 9 6 - 23 mg/dL   Creatinine, Ser 0.91 0.40 - 1.50 mg/dL   Total Bilirubin 0.6 0.2 - 1.2 mg/dL   Alkaline Phosphatase 48 39 - 117 U/L   AST 34 0 - 37 U/L   ALT 51 0 - 53 U/L   Total Protein 7.0 6.0 - 8.3 g/dL   Albumin 4.4 3.5 - 5.2 g/dL   Calcium 9.6 8.4 - 10.5 mg/dL   GFR 84.44 >60.00 mL/min   Assessment & Plan:   Problem List Items Addressed This Visit    Severe obesity (BMI 35.0-39.9) with comorbidity ([place])    Continue to encourage healthy diet and lifestyle changes to affect sustainable weight loss.       HYPERTENSION, BENIGN    Chronic, stable. Continue current regimen.       Hyperlipidemia associated with type 2 diabetes mellitus (HCC)    Chronic, stable on current regimen - continue. The ASCVD Risk score Mikey Bussing DC Jr., et al., 2013) failed to  calculate for the following reasons:   The valid total cholesterol range is 130 to 320 mg/dL       Health maintenance examination - Primary    Preventative protocols reviewed and updated unless pt declined. Discussed healthy diet and lifestyle.       Diverticulosis    With diverticulitis about once a year. Requests abx WASP - filled. Reviewed indications to start abx as well as need to notify us if he does start med for close monitoring. Reviewed complications of diverticulitis.       Diabetes mellitus type 2, controlled, with complications (HCC)    Chronic, mild deterioration. Encouraged renewed  efforts at diabetic diet.           Meds ordered this encounter  Medications  . ciprofloxacin (CIPRO) 500 MG tablet    Sig: Take 1 tablet (500 mg total) by mouth 2 (two) times daily.    Dispense:  20 tablet    Refill:  0  . metroNIDAZOLE (FLAGYL) 500 MG tablet    Sig: Take 1 tablet (500 mg total) by mouth 3 (three) times daily.    Dispense:  30 tablet    Refill:  0   No orders of the defined types were placed in this encounter.   Follow up plan: Return in about 6 months (around 11/05/2019) for follow up visit.  Ria Bush, MD

## 2019-05-27 ENCOUNTER — Other Ambulatory Visit: Payer: Self-pay | Admitting: Cardiovascular Disease

## 2019-05-27 MED ORDER — METRONIDAZOLE 500 MG PO TABS: 500.0000 mg | ORAL_TABLET | Freq: Three times a day (TID) | ORAL | 1 refills | Status: DC

## 2019-05-27 MED ORDER — CIPROFLOXACIN HCL 500 MG PO TABS: 500.0000 mg | ORAL_TABLET | Freq: Two times a day (BID) | ORAL | 1 refills | Status: DC

## 2019-06-18 DIAGNOSIS — M25511 Pain in right shoulder: Secondary | ICD-10-CM | POA: Diagnosis not present

## 2019-06-18 DIAGNOSIS — G8929 Other chronic pain: Secondary | ICD-10-CM | POA: Diagnosis not present

## 2019-06-18 DIAGNOSIS — M25512 Pain in left shoulder: Secondary | ICD-10-CM | POA: Diagnosis not present

## 2019-07-01 DIAGNOSIS — B078 Other viral warts: Secondary | ICD-10-CM | POA: Diagnosis not present

## 2019-07-01 DIAGNOSIS — L219 Seborrheic dermatitis, unspecified: Secondary | ICD-10-CM | POA: Diagnosis not present

## 2019-07-16 DIAGNOSIS — B079 Viral wart, unspecified: Secondary | ICD-10-CM | POA: Diagnosis not present

## 2019-08-13 DIAGNOSIS — A630 Anogenital (venereal) warts: Secondary | ICD-10-CM | POA: Diagnosis not present

## 2019-09-16 DIAGNOSIS — A630 Anogenital (venereal) warts: Secondary | ICD-10-CM | POA: Diagnosis not present

## 2019-10-08 DIAGNOSIS — H40003 Preglaucoma, unspecified, bilateral: Secondary | ICD-10-CM | POA: Diagnosis not present

## 2019-10-08 LAB — HM DIABETES EYE EXAM

## 2019-10-10 ENCOUNTER — Encounter: Payer: Self-pay | Admitting: Family Medicine

## 2019-10-21 ENCOUNTER — Other Ambulatory Visit: Payer: Self-pay

## 2019-10-21 ENCOUNTER — Ambulatory Visit (INDEPENDENT_AMBULATORY_CARE_PROVIDER_SITE_OTHER): Payer: BC Managed Care – PPO | Admitting: Dermatology

## 2019-10-21 DIAGNOSIS — A630 Anogenital (venereal) warts: Secondary | ICD-10-CM | POA: Diagnosis not present

## 2019-10-21 NOTE — Progress Notes (Signed)
   Follow-Up Visit   Subjective  Eric [name] is a 64 y.o. male who presents for the following: Follow-up (Condyloma follow up - perianal area; biopsy proven. Treated with LN2 and Candida Antigen injections.). Patient has been improving with treatments.  The last couple visits his lesion has been smaller than the previous visits.   The following portions of the chart were reviewed this encounter and updated as appropriate:     Review of Systems: No other skin or systemic complaints.  Objective  Well appearing patient in no apparent distress; mood and affect are within normal limits.  A focused examination was performed including perianal area. Relevant physical exam findings are noted in the Assessment and Plan.  Objective  Sup perianal area: 1.0 x 0.7 cm whitish keratotic plaque.  Assessment & Plan  Condyloma Sup perianal area Biopsy-proven condyloma.  Improved today from last visit.  But persistent.  The treatment seems to be working gradually. Discussed potential malignant transformation.  If it does not continue to improve we will consider rebiopsy.  Candida antigen injection 0.3 cc total today. Lot # [id number] Exp 03/05/2020  Destruction of lesion - Sup perianal area Complexity: simple   Destruction method: cryotherapy   Informed consent: discussed and consent obtained   Timeout:  patient name, date of birth, surgical site, and procedure verified Lesion destroyed using liquid nitrogen: Yes   Region frozen until ice ball extended beyond lesion: Yes   Outcome: patient tolerated procedure well with no complications   Post-procedure details: wound care instructions given     Return in about 4 weeks (around 11/18/2019).   I, Ashok Cordia, CMA, am acting as scribe for Sarina Ser, MD .

## 2019-10-21 NOTE — Patient Instructions (Signed)

## 2019-11-04 ENCOUNTER — Other Ambulatory Visit: Payer: Self-pay | Admitting: Family Medicine

## 2019-11-04 DIAGNOSIS — E118 Type 2 diabetes mellitus with unspecified complications: Secondary | ICD-10-CM

## 2019-11-05 ENCOUNTER — Other Ambulatory Visit (INDEPENDENT_AMBULATORY_CARE_PROVIDER_SITE_OTHER): Payer: BC Managed Care – PPO

## 2019-11-05 DIAGNOSIS — E118 Type 2 diabetes mellitus with unspecified complications: Secondary | ICD-10-CM | POA: Diagnosis not present

## 2019-11-05 LAB — BASIC METABOLIC PANEL
BUN: 12 mg/dL (ref 6–23)
CO2: 28 mEq/L (ref 19–32)
Calcium: 9.5 mg/dL (ref 8.4–10.5)
Chloride: 102 mEq/L (ref 96–112)
Creatinine, Ser: 0.78 mg/dL (ref 0.40–1.50)
GFR: 100.71 mL/min (ref >60.00)
Glucose, Bld: 106 mg/dL — ABNORMAL HIGH (ref 70–99)
Potassium: 4 mEq/L (ref 3.5–5.1)
Sodium: 138 mEq/L (ref 135–145)

## 2019-11-05 LAB — HEMOGLOBIN A1C: Hgb A1c MFr Bld: 6.5 % (ref 4.6–6.5)

## 2019-11-12 ENCOUNTER — Encounter: Payer: Self-pay | Admitting: Family Medicine

## 2019-11-12 ENCOUNTER — Ambulatory Visit (INDEPENDENT_AMBULATORY_CARE_PROVIDER_SITE_OTHER): Payer: BC Managed Care – PPO | Admitting: Family Medicine

## 2019-11-12 ENCOUNTER — Other Ambulatory Visit: Payer: Self-pay

## 2019-11-12 VITALS — BP 136/78 | HR 71 | Temp 97.9°F | Ht 68.75 in | Wt 242.0 lb

## 2019-11-12 DIAGNOSIS — E118 Type 2 diabetes mellitus with unspecified complications: Secondary | ICD-10-CM | POA: Diagnosis not present

## 2019-11-12 NOTE — Assessment & Plan Note (Addendum)
Chronic, stable on metformin 1500mg  daily - he takes this way and tolerates well. Will continue.

## 2019-11-12 NOTE — Progress Notes (Signed)
This visit was conducted in person.  BP 136/78 (BP Location: Left Arm, Patient Position: Sitting, Cuff Size: Large)   Pulse 71   Temp 97.9 F (36.6 C) (Temporal)   Ht 5' 8.75" (1.746 m)   Wt 242 lb (109.8 kg)   SpO2 93%   BMI 36.00 kg/m    CC: 43mo DM f/u visit  Subjective:    Patient ID: Eric [name], male    DOB: 11-14-1955, 64 y.o.   MRN: [id number]  HPI: Eric [name] is a 64 y.o. male presenting on 11/12/2019 for Diabetes (Here for 6 mo f/u.)   DM - does not regularly check sugars. Compliant with antihyperglycemic regimen which includes: metformin 1500mg  in am. Denies low sugars or hypoglycemic symptoms. Denies paresthesias. Last diabetic eye exam 09/2019. Pneumovax: 2015. Prevnar: not due. Glucometer brand: declines. DSME: declined. No regular exercise. Unloads trucks for a living.  Lab Results  Component Value Date   HGBA1C 6.5 11/05/2019   Diabetic Foot Exam - Simple   Simple Foot Form Diabetic Foot exam was performed with the following findings: Yes 11/12/2019 12:00 PM  Visual Inspection No deformities, no ulcerations, no other skin breakdown bilaterally: Yes Sensation Testing Intact to touch and monofilament testing bilaterally: Yes Pulse Check Posterior Tibialis and Dorsalis pulse intact bilaterally: Yes Comments    Lab Results  Component Value Date   MICROALBUR <0.7 03/10/2015         Relevant past medical, surgical, family and social history reviewed and updated as indicated. Interim medical history since our last visit reviewed. Allergies and medications reviewed and updated. Outpatient Medications Prior to Visit  Medication Sig Dispense Refill  . Ascorbic Acid (VITAMIN C) 1000 MG tablet Take 1,000 mg by mouth daily.    [name] aspirin EC 81 MG tablet Take 1 tablet (81 mg total) by mouth daily. 90 tablet 3  . cholecalciferol (VITAMIN D3) 25 MCG (1000 UT) tablet Take 1,000 Units by mouth daily.    . ciprofloxacin (CIPRO) 500 MG tablet Take 1 tablet  (500 mg total) by mouth 2 (two) times daily. 20 tablet 1  . clopidogrel (PLAVIX) 75 MG tablet Take 1 tablet (75 mg total) by mouth daily. 90 tablet 4  . Coenzyme Q10 200 MG capsule Take 200 mg by mouth daily.    . cyclobenzaprine (FLEXERIL) 10 MG tablet Take 1 tablet (10 mg total) by mouth 3 (three) times daily as needed for muscle spasms. 30 tablet 1  . diphenhydrAMINE (BENADRYL) 25 MG tablet Take 25 mg by mouth every 6 (six) hours as needed.    [name] Calcium (STOOL SOFTENER PO) Take by mouth daily.    [name] EPINEPHRINE 0.3 mg/0.3 mL IJ SOAJ injection INJECT INTRAMUSCULARLY AS DIRECTED 2 Device 1  . famotidine (PEPCID) 20 MG tablet Take 1 tablet (20 mg total) by mouth 2 (two) times daily. 180 tablet 3  . fexofenadine (ALLEGRA) 180 MG tablet Take 1 tablet (180 mg total) by mouth daily. 90 tablet 4  . hydrochlorothiazide (HYDRODIURIL) 25 MG tablet Take 1 tablet (25 mg total) by mouth daily. 90 tablet 4  . HYDROcodone-acetaminophen (NORCO) 5-325 MG tablet Take 1 tablet by mouth every 6 (six) hours as needed for moderate pain. 10 tablet 0  . losartan (COZAAR) 100 MG tablet Take 1 tablet (100 mg total) by mouth daily. 90 tablet 4  . metFORMIN (GLUCOPHAGE) 500 MG tablet TAKE 3 TABLETS BY MOUTH EVERY MORNING WITH BREAKFAST 270 tablet 3  . Methylcellulose, Laxative, (CITRUCEL PO)  Take by mouth. As needed    . metroNIDAZOLE (FLAGYL) 500 MG tablet Take 1 tablet (500 mg total) by mouth 3 (three) times daily. 30 tablet 1  . Multiple Vitamins-Minerals (CENTRUM SILVER) tablet Take 1 tablet by mouth daily.      . nitroGLYCERIN (NITROSTAT) 0.4 MG SL tablet PLACE 1 TABLET UNDER THE TONGUE IF NEEDED EVERY 5 MINUTES FOR CHEST PAIN FOR 3 DOSES IF NO RELIEF AFER FIRST DOSE CALL PRESCRIBER OR 911 25 tablet 3  . NON FORMULARY Vioprex 1,400 take two tablets daily for joints.    . Omega-3 Fatty Acids (FISH OIL CONCENTRATE) 1000 MG CAPS Take three tablets daily.    [name] omeprazole (PRILOSEC) 40 MG capsule Take 1 capsule (40  mg total) by mouth daily. 90 capsule 3  . rosuvastatin (CRESTOR) 40 MG tablet Take 1 tablet (40 mg total) by mouth daily. 90 tablet 4  . sildenafil (REVATIO) 20 MG tablet Take 1 tablet (20 mg total) by mouth 3 (three) times daily as needed. 90 tablet 6  . traMADol (ULTRAM) 50 MG tablet Take 1 tablet (50 mg total) by mouth every 6 (six) hours as needed. 25 tablet 3   No facility-administered medications prior to visit.     Per HPI unless specifically indicated in ROS section below Review of Systems Objective:    BP 136/78 (BP Location: Left Arm, Patient Position: Sitting, Cuff Size: Large)   Pulse 71   Temp 97.9 F (36.6 C) (Temporal)   Ht 5' 8.75" (1.746 m)   Wt 242 lb (109.8 kg)   SpO2 93%   BMI 36.00 kg/m   Wt Readings from Last 3 Encounters:  11/12/19 242 lb (109.8 kg)  05/07/19 246 lb 6 oz (111.8 kg)  04/30/19 245 lb 4 oz (111.2 kg)    Physical Exam Vitals and nursing note reviewed.  Constitutional:      General: He is not in acute distress.    Appearance: Normal appearance. He is well-developed. He is obese. He is not ill-appearing.  HENT:     Head: Normocephalic and atraumatic.     Right Ear: External ear normal.     Left Ear: External ear normal.  Eyes:     General: No scleral icterus.    Extraocular Movements: Extraocular movements intact.     Conjunctiva/sclera: Conjunctivae normal.     Pupils: Pupils are equal, round, and reactive to light.  Cardiovascular:     Rate and Rhythm: Normal rate and regular rhythm.     Pulses: Normal pulses.     Heart sounds: Normal heart sounds. No murmur.  Pulmonary:     Effort: Pulmonary effort is normal. No respiratory distress.     Breath sounds: Normal breath sounds. No wheezing, rhonchi or rales.  Musculoskeletal:     Cervical back: Normal range of motion and neck supple.     Right lower leg: No edema.     Left lower leg: No edema.     Comments: See HPI for foot exam if done  Lymphadenopathy:     Cervical: No cervical  adenopathy.  Skin:    General: Skin is warm and dry.     Findings: No rash.  Neurological:     Mental Status: He is alert.  Psychiatric:        Mood and Affect: Mood normal.        Behavior: Behavior normal.       Results for orders placed or performed in visit on 11/05/19  Basic  metabolic panel  Result Value Ref Range   Sodium 138 135 - 145 mEq/L   Potassium 4.0 3.5 - 5.1 mEq/L   Chloride 102 96 - 112 mEq/L   CO2 28 19 - 32 mEq/L   Glucose, Bld 106 (H) 70 - 99 mg/dL   BUN 12 6 - 23 mg/dL   Creatinine, Ser 0.78 0.40 - 1.50 mg/dL   GFR 100.71 >60.00 mL/min   Calcium 9.5 8.4 - 10.5 mg/dL  Hemoglobin A1c  Result Value Ref Range   Hgb A1c MFr Bld 6.5 4.6 - 6.5 %   Assessment & Plan:  This visit occurred during the SARS-CoV-2 public health emergency.  Safety protocols were in place, including screening questions prior to the visit, additional usage of staff PPE, and extensive cleaning of exam room while observing appropriate contact time as indicated for disinfecting solutions.   Problem List Items Addressed This Visit    Diabetes mellitus type 2, controlled, with complications ([place]) - Primary    Chronic, stable on metformin 1500mg  daily - he takes this way and tolerates well. Will continue.           No orders of the defined types were placed in this encounter.  No orders of the defined types were placed in this encounter.  Patient Instructions  You are doing well today - continue current medicines Sugars were better.  Continue working on low sugar low carb diet, weight loss.  Return as needed or in 6 months for a physical.     Follow up plan: Return in about 6 months (around 05/13/2020) for annual exam, prior fasting for blood work.  Ria Bush, MD

## 2019-11-12 NOTE — Patient Instructions (Addendum)
You are doing well today - continue current medicines Sugars were better.  Continue working on low sugar low carb diet, weight loss.  Return as needed or in 6 months for a physical.
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
## 2019-11-19 ENCOUNTER — Other Ambulatory Visit: Payer: Self-pay

## 2019-11-19 ENCOUNTER — Encounter: Payer: Self-pay | Admitting: Cardiovascular Disease

## 2019-11-19 ENCOUNTER — Ambulatory Visit (INDEPENDENT_AMBULATORY_CARE_PROVIDER_SITE_OTHER): Payer: BC Managed Care – PPO | Admitting: Cardiovascular Disease

## 2019-11-19 VITALS — BP 120/68 | HR 78 | Ht 69.0 in | Wt 241.0 lb

## 2019-11-19 DIAGNOSIS — E782 Mixed hyperlipidemia: Secondary | ICD-10-CM | POA: Diagnosis not present

## 2019-11-19 DIAGNOSIS — I25118 Atherosclerotic heart disease of native coronary artery with other forms of angina pectoris: Secondary | ICD-10-CM

## 2019-11-19 DIAGNOSIS — I10 Essential (primary) hypertension: Secondary | ICD-10-CM | POA: Diagnosis not present

## 2019-11-19 DIAGNOSIS — E118 Type 2 diabetes mellitus with unspecified complications: Secondary | ICD-10-CM

## 2019-11-19 MED ORDER — METRONIDAZOLE 500 MG PO TABS: 500.0000 mg | ORAL_TABLET | Freq: Three times a day (TID) | ORAL | 1 refills | Status: DC

## 2019-11-19 MED ORDER — TRAMADOL HCL 50 MG PO TABS: 50.0000 mg | ORAL_TABLET | Freq: Four times a day (QID) | ORAL | 1 refills | Status: DC | PRN

## 2019-11-19 MED ORDER — CYCLOBENZAPRINE HCL 10 MG PO TABS: 10.0000 mg | ORAL_TABLET | Freq: Three times a day (TID) | ORAL | 1 refills | Status: DC | PRN

## 2019-11-19 MED ORDER — SILDENAFIL CITRATE 20 MG PO TABS: 20.0000 mg | ORAL_TABLET | Freq: Three times a day (TID) | ORAL | 6 refills | Status: DC | PRN

## 2019-11-19 MED ORDER — CIPROFLOXACIN HCL 500 MG PO TABS: 500.0000 mg | ORAL_TABLET | Freq: Two times a day (BID) | ORAL | 1 refills | Status: DC

## 2019-11-19 NOTE — Patient Instructions (Signed)

## 2019-11-25 ENCOUNTER — Other Ambulatory Visit: Payer: Self-pay

## 2019-11-25 ENCOUNTER — Ambulatory Visit (INDEPENDENT_AMBULATORY_CARE_PROVIDER_SITE_OTHER): Payer: BC Managed Care – PPO | Admitting: Dermatology

## 2019-11-25 DIAGNOSIS — D485 Neoplasm of uncertain behavior of skin: Secondary | ICD-10-CM | POA: Diagnosis not present

## 2019-11-25 DIAGNOSIS — A630 Anogenital (venereal) warts: Secondary | ICD-10-CM

## 2019-11-25 DIAGNOSIS — L820 Inflamed seborrheic keratosis: Secondary | ICD-10-CM | POA: Diagnosis not present

## 2019-11-25 DIAGNOSIS — D489 Neoplasm of uncertain behavior, unspecified: Secondary | ICD-10-CM

## 2019-11-25 NOTE — Patient Instructions (Addendum)
Cryotherapy Aftercare  . Wash gently with soap and water everyday.   . Apply Vaseline and Band-Aid daily until healed.  Wound Care Instructions  1. Cleanse wound gently with soap and water once a day then pat dry with clean gauze. Apply a thing coat of Petrolatum (petroleum jelly, "Vaseline") over the wound (unless you have an allergy to this). We recommend that you use a new, sterile tube of Vaseline. Do not pick or remove scabs. Do not remove the yellow or white "healing tissue" from the base of the wound.  2. Cover the wound with fresh, clean, nonstick gauze and secure with paper tape. You may use Band-Aids in place of gauze and tape if the would is small enough, but would recommend trimming much of the tape off as there is often too much. Sometimes Band-Aids can irritate the skin.  3. You should call the office for your biopsy report after 1 week if you have not already been contacted.  4. If you experience any problems, such as abnormal amounts of bleeding, swelling, significant bruising, significant pain, or evidence of infection, please call the office immediately.  5. FOR ADULT SURGERY PATIENTS: If you need something for pain relief you may take 1 extra strength Tylenol (acetaminophen) AND 2 Ibuprofen (200mg each) together every 4 hours as needed for pain. (do not take these if you are allergic to them or if you have a reason you should not take them.) Typically, you may only need pain medication for 1 to 3 days.     

## 2019-11-25 NOTE — Progress Notes (Signed)
   Follow-Up Visit   Subjective  Eric [name] is a 64 y.o. male who presents for the following: Condyloma follow up ( perianal area; biopsy proven. Treated with LN2 and Candida Antigen injections.).Patient states it feels about the same) and area of concern (has an area on left scalp that he would like to have looked at). Patient has been improving with treatments.  The last couple visits his lesion has been smaller than the previous visits.   The following portions of the chart were reviewed this encounter and updated as appropriate:  Tobacco  Allergies  Meds  Problems  Med Hx  Surg Hx  Fam Hx     Review of Systems:  No other skin or systemic complaints except as noted in HPI or Assessment and Plan.  Objective  Well appearing patient in no apparent distress; mood and affect are within normal limits.  A focused examination was performed including Buttock / perianal area and Scalp. Relevant physical exam findings are noted in the Assessment and Plan.  Objective  Left anterior scalp: 0.8cm flesh colored papule   Objective  Sub Perianal: Sup perianal area: 1.0 x 0.7 cm whitish keratotic plaque with central fissure   Assessment & Plan  Neoplasm of uncertain behavior Left anterior scalp  Epidermal / dermal shaving  Lesion length (cm):  0.8 Lesion width (cm):  0.8 Margin per side (cm):  0.2 Total excision diameter (cm):  1.2 Informed consent: discussed and consent obtained   Timeout: patient name, date of birth, surgical site, and procedure verified   Procedure prep:  Patient was prepped and draped in usual sterile fashion Prep type:  Isopropyl alcohol Anesthesia: the lesion was anesthetized in a standard fashion   Anesthetic:  1% lidocaine w/ epinephrine 1-100,000 buffered w/ 8.4% NaHCO3 Instrument used: flexible razor blade   Hemostasis achieved with: pressure, aluminum chloride and electrodesiccation   Outcome: patient tolerated procedure well   Post-procedure  details: sterile dressing applied and wound care instructions given   Dressing type: bandage and petrolatum    Specimen 1 - Surgical pathology Differential Diagnosis: Nevus vs SK R/O dysplasia Check Margins: No 0.8cm flesh colored papule  Shave removal today   Perianal condylomata Superior Perianal  Sup perianal area Biopsy-proven condyloma.  Improved today from last visit.  But persistent.  The treatment seems to be working gradually. Discussed potential malignant transformation.  If it does not continue to improve or not smaller than 1.0 cm we will consider rebiopsy.   Candida antigen injection 0.35 cc total today. Lot # [id number] Exp 03/05/2020  Destruction of lesion - Superior Perianal Complexity: simple   Destruction method: cryotherapy   Informed consent: discussed and consent obtained   Timeout:  patient name, date of birth, surgical site, and procedure verified Lesion destroyed using liquid nitrogen: Yes   Region frozen until ice ball extended beyond lesion: Yes   Outcome: patient tolerated procedure well with no complications   Post-procedure details: wound care instructions given    Return in about 6 weeks (around 01/06/2020) for Condyloma F/U.  Marene Lenz, CMA, am acting as scribe for Sarina Ser, MD   Documentation: I have reviewed the above documentation for accuracy and completeness, and I agree with the above.  Sarina Ser, MD

## 2019-11-27 ENCOUNTER — Encounter: Payer: Self-pay | Admitting: Dermatology

## 2019-11-28 ENCOUNTER — Telehealth: Payer: Self-pay

## 2019-11-28 NOTE — Telephone Encounter (Signed)
Patient informed of pathology results 

## 2019-12-24 ENCOUNTER — Telehealth: Payer: Self-pay | Admitting: Cardiovascular Disease

## 2019-12-24 NOTE — Telephone Encounter (Signed)
error 

## 2019-12-26 ENCOUNTER — Other Ambulatory Visit: Payer: Self-pay

## 2019-12-26 ENCOUNTER — Ambulatory Visit (INDEPENDENT_AMBULATORY_CARE_PROVIDER_SITE_OTHER): Payer: BC Managed Care – PPO | Admitting: Family Medicine

## 2019-12-26 ENCOUNTER — Encounter: Payer: Self-pay | Admitting: Family Medicine

## 2019-12-26 DIAGNOSIS — F4320 Adjustment disorder, unspecified: Secondary | ICD-10-CM | POA: Diagnosis not present

## 2019-12-26 MED ORDER — DIAZEPAM 5 MG PO TABS: 2.5000 mg | ORAL_TABLET | Freq: Two times a day (BID) | ORAL | 0 refills | Status: DC | PRN

## 2019-12-26 NOTE — Progress Notes (Signed)
This visit was conducted in person.  BP 134/80 (BP Location: Left Arm, Patient Position: Sitting, Cuff Size: Large)   Pulse 87   Temp 97.9 F (36.6 C) (Temporal)   Ht 5\' 9"  (1.753 m)   Wt 235 lb 9 oz (106.9 kg)   SpO2 99%   BMI 34.79 kg/m    CC: discuss sleep Subjective:    Patient ID: Eric [name], male    DOB: 1955/09/24, 64 y.o.   MRN: [id number]  HPI: ANTONY PYATT is a 64 y.o. male presenting on 12/26/2019 for Insomnia (C/o trouble falling asleep and staying asleep since brother's passing 12/09/19. )   Brother suddenly passed last month (suicide by gun).  Pt hasn't had a night's sleep since then. Had lost appetite but this has picked up. He is functioning well during the day.   He's been taking flexeril to help sleep - this hasn't helped.  No recent hydrocodone use. Takes tramadol nightly PRN.   Patient is a Administrator - long distance driver. Works 1am, 15+ hour work days. Requests leave of absence - to return to work July 7th     Relevant past medical, surgical, family and social history reviewed and updated as indicated. Interim medical history since our last visit reviewed. Allergies and medications reviewed and updated. Outpatient Medications Prior to Visit  Medication Sig Dispense Refill  . Ascorbic Acid (VITAMIN C) 1000 MG tablet Take 1,000 mg by mouth daily.    [name] aspirin EC 81 MG tablet Take 1 tablet (81 mg total) by mouth daily. 90 tablet 3  . cholecalciferol (VITAMIN D3) 25 MCG (1000 UT) tablet Take 1,000 Units by mouth daily.    . ciprofloxacin (CIPRO) 500 MG tablet Take 1 tablet (500 mg total) by mouth 2 (two) times daily. 20 tablet 1  . clopidogrel (PLAVIX) 75 MG tablet Take 1 tablet (75 mg total) by mouth daily. 90 tablet 4  . Coenzyme Q10 200 MG capsule Take 200 mg by mouth daily.    . cyclobenzaprine (FLEXERIL) 10 MG tablet Take 1 tablet (10 mg total) by mouth 3 (three) times daily as needed for muscle spasms. 30 tablet 1  . diphenhydrAMINE  (BENADRYL) 25 MG tablet Take 25 mg by mouth every 6 (six) hours as needed.    [name] Calcium (STOOL SOFTENER PO) Take by mouth daily.    [name] EPINEPHRINE 0.3 mg/0.3 mL IJ SOAJ injection INJECT INTRAMUSCULARLY AS DIRECTED 2 Device 1  . famotidine (PEPCID) 20 MG tablet Take 1 tablet (20 mg total) by mouth 2 (two) times daily. 180 tablet 3  . fexofenadine (ALLEGRA) 180 MG tablet Take 1 tablet (180 mg total) by mouth daily. 90 tablet 4  . hydrochlorothiazide (HYDRODIURIL) 25 MG tablet Take 1 tablet (25 mg total) by mouth daily. 90 tablet 4  . HYDROcodone-acetaminophen (NORCO) 5-325 MG tablet Take 1 tablet by mouth every 6 (six) hours as needed for moderate pain. 10 tablet 0  . losartan (COZAAR) 100 MG tablet Take 1 tablet (100 mg total) by mouth daily. 90 tablet 4  . metFORMIN (GLUCOPHAGE) 500 MG tablet TAKE 3 TABLETS BY MOUTH EVERY MORNING WITH BREAKFAST 270 tablet 3  . Methylcellulose, Laxative, (CITRUCEL PO) Take by mouth. As needed    . metroNIDAZOLE (FLAGYL) 500 MG tablet Take 1 tablet (500 mg total) by mouth 3 (three) times daily. 30 tablet 1  . Multiple Vitamins-Minerals (CENTRUM SILVER) tablet Take 1 tablet by mouth daily.      . nitroGLYCERIN (  NITROSTAT) 0.4 MG SL tablet PLACE 1 TABLET UNDER THE TONGUE IF NEEDED EVERY 5 MINUTES FOR CHEST PAIN FOR 3 DOSES IF NO RELIEF AFER FIRST DOSE CALL PRESCRIBER OR 911 25 tablet 3  . NON FORMULARY Vioprex 1,400 take two tablets daily for joints.    . Omega-3 Fatty Acids (FISH OIL CONCENTRATE) 1000 MG CAPS Take three tablets daily.    [name] omeprazole (PRILOSEC) 40 MG capsule Take 1 capsule (40 mg total) by mouth daily. 90 capsule 3  . rosuvastatin (CRESTOR) 40 MG tablet Take 1 tablet (40 mg total) by mouth daily. 90 tablet 4  . sildenafil (REVATIO) 20 MG tablet Take 1 tablet (20 mg total) by mouth 3 (three) times daily as needed. 90 tablet 6  . traMADol (ULTRAM) 50 MG tablet Take 1 tablet (50 mg total) by mouth every 6 (six) hours as needed. 20 tablet 1   No  facility-administered medications prior to visit.     Per HPI unless specifically indicated in ROS section below Review of Systems Objective:  BP 134/80 (BP Location: Left Arm, Patient Position: Sitting, Cuff Size: Large)   Pulse 87   Temp 97.9 F (36.6 C) (Temporal)   Ht 5\' 9"  (1.753 m)   Wt 235 lb 9 oz (106.9 kg)   SpO2 99%   BMI 34.79 kg/m   Wt Readings from Last 3 Encounters:  12/26/19 235 lb 9 oz (106.9 kg)  11/19/19 241 lb (109.3 kg)  11/12/19 242 lb (109.8 kg)      Physical Exam Vitals and nursing note reviewed.  Constitutional:      Appearance: Normal appearance. He is obese. He is not ill-appearing.  Psychiatric:     Comments: Tearful with discussion of brother's recent death       Results for orders placed or performed in visit on 123456  Basic metabolic panel  Result Value Ref Range   Sodium 138 135 - 145 mEq/L   Potassium 4.0 3.5 - 5.1 mEq/L   Chloride 102 96 - 112 mEq/L   CO2 28 19 - 32 mEq/L   Glucose, Bld 106 (H) 70 - 99 mg/dL   BUN 12 6 - 23 mg/dL   Creatinine, Ser 0.78 0.40 - 1.50 mg/dL   GFR 100.71 >60.00 mL/min   Calcium 9.5 8.4 - 10.5 mg/dL  Hemoglobin A1c  Result Value Ref Range   Hgb A1c MFr Bld 6.5 4.6 - 6.5 %   Depression screen [facility] 2/9 05/07/2019 04/03/2018 03/28/2017  Decreased Interest 0 0 0  Down, Depressed, Hopeless 0 0 0  PHQ - 2 Score 0 0 0    Assessment & Plan:  This visit occurred during the SARS-CoV-2 public health emergency.  Safety protocols were in place, including screening questions prior to the visit, additional usage of staff PPE, and extensive cleaning of exam room while observing appropriate contact time as indicated for disinfecting solutions.   Problem List Items Addressed This Visit    Adjustment disorder    Struggling after brother's sudden passing after suicide.  Support provided.  Reasonable to keep out of work, letter provided - through July 7th.  Will Rx valium 2.5-5mg  at night PRN insomnia related to shock,  recent traumatic experience.  RTC 3 wks f/u visit. Pt agrees with plan.           Meds ordered this encounter  Medications  . diazepam (VALIUM) 5 MG tablet    Sig: Take 0.5-1 tablets (2.5-5 mg total) by mouth every 12 (twelve) hours  as needed for anxiety or muscle spasms (sleep).    Dispense:  30 tablet    Refill:  0   No orders of the defined types were placed in this encounter.   Patient Instructions  I'm so sorry about your brother.  Letter provided today for FMLA leave for the next month with return to work date on July 7th.  In the meantime, try valium 5mg  1/2-1 tablet at night time for sleep.  Keep me updated with how this helps. We have other options.  Return in 3 weeks for follow up visit.    Follow up plan: Return in about 3 weeks (around 01/16/2020) for follow up visit.  Ria Bush, MD

## 2019-12-26 NOTE — Patient Instructions (Addendum)
I'm so sorry about your brother.  Letter provided today for FMLA leave for the next month with return to work date on July 7th.  In the meantime, try valium 5mg  1/2-1 tablet at night time for sleep.  Keep me updated with how this helps. We have other options.  Return in 3 weeks for follow up visit.

## 2019-12-26 NOTE — Assessment & Plan Note (Signed)
Struggling after brother's sudden passing after suicide.  Support provided.  Reasonable to keep out of work, letter provided - through July 7th.  Will Rx valium 2.5-5mg  at night PRN insomnia related to shock, recent traumatic experience.  RTC 3 wks f/u visit. Pt agrees with plan.

## 2020-01-16 ENCOUNTER — Ambulatory Visit (INDEPENDENT_AMBULATORY_CARE_PROVIDER_SITE_OTHER): Payer: BC Managed Care – PPO | Admitting: Family Medicine

## 2020-01-16 ENCOUNTER — Encounter: Payer: Self-pay | Admitting: Family Medicine

## 2020-01-16 ENCOUNTER — Other Ambulatory Visit: Payer: Self-pay

## 2020-01-16 VITALS — BP 130/70 | HR 86 | Temp 98.2°F | Ht 69.0 in | Wt 241.2 lb

## 2020-01-16 DIAGNOSIS — M25511 Pain in right shoulder: Secondary | ICD-10-CM

## 2020-01-16 DIAGNOSIS — G8929 Other chronic pain: Secondary | ICD-10-CM | POA: Diagnosis not present

## 2020-01-16 DIAGNOSIS — F4320 Adjustment disorder, unspecified: Secondary | ICD-10-CM | POA: Diagnosis not present

## 2020-01-16 DIAGNOSIS — M7541 Impingement syndrome of right shoulder: Secondary | ICD-10-CM | POA: Diagnosis not present

## 2020-01-16 DIAGNOSIS — M7542 Impingement syndrome of left shoulder: Secondary | ICD-10-CM | POA: Diagnosis not present

## 2020-01-16 DIAGNOSIS — M25512 Pain in left shoulder: Secondary | ICD-10-CM

## 2020-01-16 MED ORDER — TRAZODONE HCL 50 MG PO TABS: 25.0000 mg | ORAL_TABLET | Freq: Every evening | ORAL | 3 refills | Status: DC | PRN

## 2020-01-16 MED ORDER — DIAZEPAM 5 MG PO TABS: 5.0000 mg | ORAL_TABLET | Freq: Two times a day (BID) | ORAL | 0 refills | Status: DC | PRN

## 2020-01-16 NOTE — Patient Instructions (Addendum)
Sorry you're still not sleeping well.  Ok to continue valium 5mg  at night as needed for sleep.  May also try trazodone 1/2 tab nightly for 2-3 days and if tolerated well, may increase to 1 tablet nightly for sleep.  Let's stay out of work for another month.  Return in 1 month for follow up visit.

## 2020-01-16 NOTE — Assessment & Plan Note (Signed)
This could be contributing to difficulty with sleep - he just had bilateral shoulder steroid injections this morning - hopeful this will help sleep as well.

## 2020-01-16 NOTE — Progress Notes (Signed)
This visit was conducted in person.  BP 130/70 (BP Location: Left Arm, Patient Position: Sitting, Cuff Size: Large)   Pulse 86   Temp 98.2 F (36.8 C) (Temporal)   Ht 5\' 9"  (1.753 m)   Wt 241 lb 3 oz (109.4 kg)   SpO2 94%   BMI 35.62 kg/m    CC: 3 wk f/u visit  Subjective:    Patient ID: Eric [name], male    DOB: Mar 08, 1956, 64 y.o.   MRN: [id number]  HPI: Eric [name] is a 64 y.o. male presenting on 01/16/2020 for Mood (Here for 3 wk f/u.)   See prior note for details.  Placed on leave through July 7th after brother's sudden passing.  We also prescribed valium 2.5-5mg  QHS PRN insomnia. This hasn't really helped.  Staying up until 1am.   Ongoing bilateral shoulder pain he feels is also affecting ability to sleep up to 1 month ago - he just received steroid injections this morning with Dr Leim Fabry at [facility].      Relevant past medical, surgical, family and social history reviewed and updated as indicated. Interim medical history since our last visit reviewed. Allergies and medications reviewed and updated. Outpatient Medications Prior to Visit  Medication Sig Dispense Refill  . Ascorbic Acid (VITAMIN C) 1000 MG tablet Take 1,000 mg by mouth daily.    [name] aspirin EC 81 MG tablet Take 1 tablet (81 mg total) by mouth daily. 90 tablet 3  . cholecalciferol (VITAMIN D3) 25 MCG (1000 UT) tablet Take 1,000 Units by mouth daily.    . clopidogrel (PLAVIX) 75 MG tablet Take 1 tablet (75 mg total) by mouth daily. 90 tablet 4  . Coenzyme Q10 200 MG capsule Take 200 mg by mouth daily.    . cyclobenzaprine (FLEXERIL) 10 MG tablet Take 1 tablet (10 mg total) by mouth 3 (three) times daily as needed for muscle spasms. 30 tablet 1  . diphenhydrAMINE (BENADRYL) 25 MG tablet Take 25 mg by mouth every 6 (six) hours as needed.    [name] Calcium (STOOL SOFTENER PO) Take by mouth daily.    [name] EPINEPHRINE 0.3 mg/0.3 mL IJ SOAJ injection INJECT INTRAMUSCULARLY AS DIRECTED 2 Device 1  .  famotidine (PEPCID) 20 MG tablet Take 1 tablet (20 mg total) by mouth 2 (two) times daily. 180 tablet 3  . fexofenadine (ALLEGRA) 180 MG tablet Take 1 tablet (180 mg total) by mouth daily. 90 tablet 4  . hydrochlorothiazide (HYDRODIURIL) 25 MG tablet Take 1 tablet (25 mg total) by mouth daily. 90 tablet 4  . HYDROcodone-acetaminophen (NORCO) 5-325 MG tablet Take 1 tablet by mouth every 6 (six) hours as needed for moderate pain. 10 tablet 0  . losartan (COZAAR) 100 MG tablet Take 1 tablet (100 mg total) by mouth daily. 90 tablet 4  . metFORMIN (GLUCOPHAGE) 500 MG tablet TAKE 3 TABLETS BY MOUTH EVERY MORNING WITH BREAKFAST 270 tablet 3  . Methylcellulose, Laxative, (CITRUCEL PO) Take by mouth. As needed    . Multiple Vitamins-Minerals (CENTRUM SILVER) tablet Take 1 tablet by mouth daily.      . nitroGLYCERIN (NITROSTAT) 0.4 MG SL tablet PLACE 1 TABLET UNDER THE TONGUE IF NEEDED EVERY 5 MINUTES FOR CHEST PAIN FOR 3 DOSES IF NO RELIEF AFER FIRST DOSE CALL PRESCRIBER OR 911 25 tablet 3  . NON FORMULARY Vioprex 1,400 take two tablets daily for joints.    . Omega-3 Fatty Acids (FISH OIL CONCENTRATE) 1000 MG CAPS Take three  tablets daily.    [name] omeprazole (PRILOSEC) 40 MG capsule Take 1 capsule (40 mg total) by mouth daily. 90 capsule 3  . rosuvastatin (CRESTOR) 40 MG tablet Take 1 tablet (40 mg total) by mouth daily. 90 tablet 4  . sildenafil (REVATIO) 20 MG tablet Take 1 tablet (20 mg total) by mouth 3 (three) times daily as needed. 90 tablet 6  . traMADol (ULTRAM) 50 MG tablet Take 1 tablet (50 mg total) by mouth every 6 (six) hours as needed. 20 tablet 1  . diazepam (VALIUM) 5 MG tablet Take 0.5-1 tablets (2.5-5 mg total) by mouth every 12 (twelve) hours as needed for anxiety or muscle spasms (sleep). 30 tablet 0  . ciprofloxacin (CIPRO) 500 MG tablet Take 1 tablet (500 mg total) by mouth 2 (two) times daily. (Patient not taking: Reported on 01/16/2020) 20 tablet 1  . metroNIDAZOLE (FLAGYL) 500 MG tablet  Take 1 tablet (500 mg total) by mouth 3 (three) times daily. (Patient not taking: Reported on 01/16/2020) 30 tablet 1   No facility-administered medications prior to visit.     Per HPI unless specifically indicated in ROS section below Review of Systems Objective:  BP 130/70 (BP Location: Left Arm, Patient Position: Sitting, Cuff Size: Large)   Pulse 86   Temp 98.2 F (36.8 C) (Temporal)   Ht 5\' 9"  (1.753 m)   Wt 241 lb 3 oz (109.4 kg)   SpO2 94%   BMI 35.62 kg/m   Wt Readings from Last 3 Encounters:  01/16/20 241 lb 3 oz (109.4 kg)  12/26/19 235 lb 9 oz (106.9 kg)  11/19/19 241 lb (109.3 kg)      Physical Exam Vitals and nursing note reviewed.  Constitutional:      Appearance: Normal appearance. He is obese. He is not ill-appearing.  Neurological:     Mental Status: He is alert.  Psychiatric:        Mood and Affect: Mood normal.        Behavior: Behavior normal.       Depression screen [facility] 2/9 01/16/2020 05/07/2019 04/03/2018 03/28/2017  Decreased Interest 0 0 0 0  Down, Depressed, Hopeless 0 0 0 0  PHQ - 2 Score 0 0 0 0  Altered sleeping 3 - - -  Tired, decreased energy 1 - - -  Change in appetite 0 - - -  Feeling bad or failure about yourself  0 - - -  Trouble concentrating 0 - - -  Moving slowly or fidgety/restless 0 - - -  Suicidal thoughts 0 - - -  PHQ-9 Score 4 - - -  Difficult doing work/chores Somewhat difficult - - -   No flowsheet data found.  Assessment & Plan:  This visit occurred during the SARS-CoV-2 public health emergency.  Safety protocols were in place, including screening questions prior to the visit, additional usage of staff PPE, and extensive cleaning of exam room while observing appropriate contact time as indicated for disinfecting solutions.   Problem List Items Addressed This Visit    Chronic pain of both shoulders    This could be contributing to difficulty with sleep - he just had bilateral shoulder steroid injections this morning -  hopeful this will help sleep as well.       Relevant Medications   traZODone (DESYREL) 50 MG tablet   Adjustment disorder - Primary    Ongoing support provided.  Ongoing insomnia difficulty - has not responded well to valium - will add trazodone  25-50mg  nightly for sleep. Ok to use valium intermittently PRN as well. Will recommend he stay out of work for another month due to ongoing struggle, letter written. RTC 68mo f/u visit.           Meds ordered this encounter  Medications  . traZODone (DESYREL) 50 MG tablet    Sig: Take 0.5-1 tablets (25-50 mg total) by mouth at bedtime as needed for sleep.    Dispense:  30 tablet    Refill:  3  . diazepam (VALIUM) 5 MG tablet    Sig: Take 1 tablet (5 mg total) by mouth every 12 (twelve) hours as needed for anxiety or sedation (sleep).    Dispense:  30 tablet    Refill:  0   No orders of the defined types were placed in this encounter.  Patient Instructions  Sorry you're still not sleeping well.  Ok to continue valium 5mg  at night as needed for sleep.  May also try trazodone 1/2 tab nightly for 2-3 days and if tolerated well, may increase to 1 tablet nightly for sleep.  Let's stay out of work for another month.  Return in 1 month for follow up visit.     Follow up plan: Return in about 4 weeks (around 02/13/2020), or if symptoms worsen or fail to improve, for follow up visit.  Ria Bush, MD

## 2020-01-16 NOTE — Assessment & Plan Note (Signed)
Ongoing support provided.  Ongoing insomnia difficulty - has not responded well to valium - will add trazodone 25-50mg  nightly for sleep. Ok to use valium intermittently PRN as well. Will recommend he stay out of work for another month due to ongoing struggle, letter written. RTC 68mo f/u visit.

## 2020-01-20 ENCOUNTER — Ambulatory Visit (INDEPENDENT_AMBULATORY_CARE_PROVIDER_SITE_OTHER): Payer: BC Managed Care – PPO | Admitting: Dermatology

## 2020-01-20 ENCOUNTER — Other Ambulatory Visit: Payer: Self-pay

## 2020-01-20 DIAGNOSIS — D045 Carcinoma in situ of skin of trunk: Secondary | ICD-10-CM | POA: Diagnosis not present

## 2020-01-20 DIAGNOSIS — D485 Neoplasm of uncertain behavior of skin: Secondary | ICD-10-CM

## 2020-01-20 DIAGNOSIS — D489 Neoplasm of uncertain behavior, unspecified: Secondary | ICD-10-CM

## 2020-01-20 DIAGNOSIS — C4492 Squamous cell carcinoma of skin, unspecified: Secondary | ICD-10-CM

## 2020-01-20 HISTORY — DX: Squamous cell carcinoma of skin, unspecified: C44.92

## 2020-01-20 NOTE — Patient Instructions (Addendum)
Wound Care Instructions  1. Cleanse wound gently with soap and water once a day then pat dry with clean gauze. Apply a thing coat of Petrolatum (petroleum jelly, "Vaseline") over the wound (unless you have an allergy to this). We recommend that you use a new, sterile tube of Vaseline. Do not pick or remove scabs. Do not remove the yellow or white "healing tissue" from the base of the wound.  2. Cover the wound with fresh, clean, nonstick gauze and secure with paper tape. You may use Band-Aids in place of gauze and tape if the would is small enough, but would recommend trimming much of the tape off as there is often too much. Sometimes Band-Aids can irritate the skin.  3. You should call the office for your biopsy report after 1 week if you have not already been contacted.  4. If you experience any problems, such as abnormal amounts of bleeding, swelling, significant bruising, significant pain, or evidence of infection, please call the office immediately.  5. FOR ADULT SURGERY PATIENTS: If you need something for pain relief you may take 1 extra strength Tylenol (acetaminophen) AND 2 Ibuprofen (200mg  each) together every 4 hours as needed for pain. (do not take these if you are allergic to them or if you have a reason you should not take them.) Typically, you may only need pain medication for 1 to 3 days.    [place]

## 2020-01-20 NOTE — Progress Notes (Signed)
   Follow-Up Visit   Subjective  Eric [name] is a 64 y.o. male who presents for the following: Follow-up.  Patient here today for biopsy proven perianal/rectal condyloma follow up. Patient has had 6 Candida injections and has been treated with LN2 with slow improvement of spot.  Patient does not think it has improved since last visit.  We decided last visit to plan re-biopsy if not improved.  The following portions of the chart were reviewed this encounter and updated as appropriate:  Tobacco  Allergies  Meds  Problems  Med Hx  Surg Hx  Fam Hx      Review of Systems:  No other skin or systemic complaints except as noted in HPI or Assessment and Plan.  Objective  Well appearing patient in no apparent distress; mood and affect are within normal limits.  A focused examination was performed including perianal. Relevant physical exam findings are noted in the Assessment and Plan.  Objective  Superior Perianal/rectal Area: White plaque with ulcer 1.0cm   Assessment & Plan    Neoplasm of uncertain behavior Superior Perianal/rectal Area  Shave/ tangential biopsy  Lesion diameter (cm):  1 Informed consent: discussed and consent obtained   Timeout: patient name, date of birth, surgical site, and procedure verified   Procedure prep:  Patient was prepped and draped in usual sterile fashion Prep type:  Isopropyl alcohol Anesthesia: the lesion was anesthetized in a standard fashion   Anesthetic:  1% lidocaine w/ epinephrine 1-100,000 buffered w/ 8.4% NaHCO3 Instrument used: #15 blade on handle  Hemostasis achieved with: pressure, aluminum chloride and electrodesiccation   Outcome: patient tolerated procedure well   Post-procedure details: sterile dressing applied and wound care instructions given   Dressing type: bandage and petrolatum    Specimen 1 - Surgical pathology Differential Diagnosis: Condyloma r/o persistence vs other Check Margins: No White plaque with ulcer  1.0cm  Post tx defect 1.3cm  Return in about 8 weeks (around 03/16/2020) for condyloma.  Graciella Belton, RMA, am acting as scribe for Sarina Ser, MD . Documentation: I have reviewed the above documentation for accuracy and completeness, and I agree with the above.  Sarina Ser, MD

## 2020-01-23 ENCOUNTER — Encounter: Payer: Self-pay | Admitting: Dermatology

## 2020-01-28 ENCOUNTER — Telehealth: Payer: Self-pay | Admitting: Cardiovascular Disease

## 2020-01-28 NOTE — Telephone Encounter (Signed)
Please advise if ok to refill Pepcid 20 mg tablet bid.

## 2020-01-29 NOTE — Telephone Encounter (Signed)
To Dr. Rockey Situ to review for refill.

## 2020-01-29 NOTE — Telephone Encounter (Signed)
Okay to refill, can also be bought for generic over-the-counter which might be cheaper

## 2020-02-03 ENCOUNTER — Telehealth: Payer: Self-pay

## 2020-02-03 NOTE — Telephone Encounter (Signed)
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  Schedule for discussion of treatment options.

## 2020-02-03 NOTE — Telephone Encounter (Signed)
Patient informed of results and appointment scheduled. 

## 2020-02-13 ENCOUNTER — Encounter: Payer: Self-pay | Admitting: Family Medicine

## 2020-02-13 ENCOUNTER — Ambulatory Visit (INDEPENDENT_AMBULATORY_CARE_PROVIDER_SITE_OTHER): Payer: BC Managed Care – PPO | Admitting: Family Medicine

## 2020-02-13 ENCOUNTER — Other Ambulatory Visit: Payer: Self-pay

## 2020-02-13 VITALS — BP 136/80 | HR 87 | Temp 97.6°F | Ht 69.0 in | Wt 235.4 lb

## 2020-02-13 DIAGNOSIS — F4320 Adjustment disorder, unspecified: Secondary | ICD-10-CM | POA: Diagnosis not present

## 2020-02-13 DIAGNOSIS — T148XXA Other injury of unspecified body region, initial encounter: Secondary | ICD-10-CM

## 2020-02-13 DIAGNOSIS — C44520 Squamous cell carcinoma of anal skin: Secondary | ICD-10-CM | POA: Insufficient documentation

## 2020-02-13 NOTE — Progress Notes (Signed)
This visit was conducted in person.  BP (!) 136/80 (BP Location: Left Arm, Patient Position: Sitting, Cuff Size: Large)   Pulse 87   Temp 97.6 F (36.4 C) (Temporal)   Ht 5\' 9"  (1.753 m)   Wt (!) 235 lb 6 oz (106.8 kg)   SpO2 94%   BMI 34.76 kg/m    CC: 1 mo f/u visit  Subjective:    Patient ID: Eric [name], male    DOB: 1956/05/25, 64 y.o.   MRN: [id number]  HPI: Eric [name] is a 64 y.o. male presenting on 02/13/2020 for Adjustment Disorder (Here for 1 mo f/u.)   See prior note for details.  Placed on leave through Feb 28, 2020 after brother's sudden death. Requests extension through 02/26/2020 for total of 9 wks, return to work at that time without restrictions.   Normal work schedule is midnight to 1pm. 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   Valium was not helpful. Last visit we added trazodone 25-50mg  nightly PRN for ongoing insomnia. He uses both intermittently, feels trazodone has helped, better able to fall asleep. Averaging about 6-7 hours of sleep at night. Nocturia x1.   Shoulders feeling better after steroid injections from last month.   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 He states wound is not healing well, has derm f/u scheduled for next week.      Relevant past medical, surgical, family and social history reviewed and updated as indicated. Interim medical history since our last visit reviewed. Allergies and medications reviewed and updated. Outpatient Medications Prior to Visit  Medication Sig Dispense Refill  . Ascorbic Acid (VITAMIN C) 1000 MG tablet Take 1,000 mg by mouth daily.    [name] aspirin EC 81 MG tablet Take 1 tablet (81 mg total) by mouth daily. 90 tablet 3  . cholecalciferol (VITAMIN D3) 25 MCG (1000 UT) tablet Take 1,000 Units by mouth daily.    . clopidogrel (PLAVIX) 75 MG tablet Take 1 tablet (75  mg total) by mouth daily. 90 tablet 4  . Coenzyme Q10 200 MG capsule Take 200 mg by mouth daily.    . cyclobenzaprine (FLEXERIL) 10 MG tablet Take 1 tablet (10 mg total) by mouth 3 (three) times daily as needed for muscle spasms. 30 tablet 1  . diazepam (VALIUM) 5 MG tablet Take 1 tablet (5 mg total) by mouth every 12 (twelve) hours as needed for anxiety or sedation (sleep). 30 tablet 0  . diphenhydrAMINE (BENADRYL) 25 MG tablet Take 25 mg by mouth every 6 (six) hours as needed.    [name] Calcium (STOOL SOFTENER PO) Take by mouth daily.    [name] EPINEPHRINE 0.3 mg/0.3 mL IJ SOAJ injection INJECT INTRAMUSCULARLY AS DIRECTED 2 Device 1  . famotidine (PEPCID) 20 MG tablet TAKE 1 TABLET BY MOUTH TWICE DAILY 180 tablet 0  . fexofenadine (ALLEGRA) 180 MG tablet Take 1 tablet (180 mg total) by mouth daily. 90 tablet 4  . hydrochlorothiazide (HYDRODIURIL) 25 MG tablet Take 1 tablet (25 mg total) by mouth daily. 90 tablet 4  . HYDROcodone-acetaminophen (NORCO) 5-325 MG tablet Take 1 tablet by mouth every 6 (six) hours as needed for moderate pain. 10 tablet 0  . losartan (COZAAR) 100 MG tablet Take 1 tablet (100 mg total) by mouth daily. 90 tablet 4  . metFORMIN (GLUCOPHAGE) 500 MG tablet TAKE 3 TABLETS BY MOUTH EVERY MORNING  WITH BREAKFAST 270 tablet 3  . Methylcellulose, Laxative, (CITRUCEL PO) Take by mouth. As needed    . metroNIDAZOLE (FLAGYL) 500 MG tablet Take 1 tablet (500 mg total) by mouth 3 (three) times daily. 30 tablet 1  . Multiple Vitamins-Minerals (CENTRUM SILVER) tablet Take 1 tablet by mouth daily.      . nitroGLYCERIN (NITROSTAT) 0.4 MG SL tablet PLACE 1 TABLET UNDER THE TONGUE IF NEEDED EVERY 5 MINUTES FOR CHEST PAIN FOR 3 DOSES IF NO RELIEF AFER FIRST DOSE CALL PRESCRIBER OR 911 25 tablet 3  . NON FORMULARY Vioprex 1,400 take two tablets daily for joints.    . Omega-3 Fatty Acids (FISH OIL CONCENTRATE) 1000 MG CAPS Take three tablets daily.    [name] omeprazole (PRILOSEC) 40 MG capsule Take  1 capsule (40 mg total) by mouth daily. 90 capsule 3  . rosuvastatin (CRESTOR) 40 MG tablet Take 1 tablet (40 mg total) by mouth daily. 90 tablet 4  . sildenafil (REVATIO) 20 MG tablet Take 1 tablet (20 mg total) by mouth 3 (three) times daily as needed. 90 tablet 6  . traMADol (ULTRAM) 50 MG tablet Take 1 tablet (50 mg total) by mouth every 6 (six) hours as needed. 20 tablet 1  . traZODone (DESYREL) 50 MG tablet Take 0.5-1 tablets (25-50 mg total) by mouth at bedtime as needed for sleep. 30 tablet 3  . ciprofloxacin (CIPRO) 500 MG tablet Take 1 tablet (500 mg total) by mouth 2 (two) times daily. (Patient not taking: Reported on 02/13/2020) 20 tablet 1   No facility-administered medications prior to visit.     Per HPI unless specifically indicated in ROS section below Review of Systems Objective:  BP (!) 136/80 (BP Location: Left Arm, Patient Position: Sitting, Cuff Size: Large)   Pulse 87   Temp 97.6 F (36.4 C) (Temporal)   Ht 5\' 9"  (1.753 m)   Wt (!) 235 lb 6 oz (106.8 kg)   SpO2 94%   BMI 34.76 kg/m   Wt Readings from Last 3 Encounters:  02/13/20 (!) 235 lb 6 oz (106.8 kg)  01/16/20 241 lb 3 oz (109.4 kg)  12/26/19 235 lb 9 oz (106.9 kg)      Physical Exam Vitals and nursing note reviewed.  Constitutional:      Appearance: Normal appearance. He is not ill-appearing.  Cardiovascular:     Rate and Rhythm: Normal rate and regular rhythm.     Pulses: Normal pulses.     Heart sounds: Normal heart sounds. No murmur heard.   Pulmonary:     Effort: Pulmonary effort is normal. No respiratory distress.     Breath sounds: Normal breath sounds. No wheezing, rhonchi or rales.  Musculoskeletal:     Right lower leg: No edema.     Left lower leg: No edema.  Skin:    Findings: Wound present.          Comments: Poorly healing open wound midline superior to anus without surrounding erythema or drainage  Neurological:     Mental Status: He is alert.  Psychiatric:        Mood and  Affect: Mood normal.        Behavior: Behavior normal.       Results for orders placed or performed in visit on 37/85/88  Basic metabolic panel  Result Value Ref Range   Sodium 138 135 - 145 mEq/L   Potassium 4.0 3.5 - 5.1 mEq/L   Chloride 102 96 - 112 mEq/L  CO2 28 19 - 32 mEq/L   Glucose, Bld 106 (H) 70 - 99 mg/dL   BUN 12 6 - 23 mg/dL   Creatinine, Ser 0.78 0.40 - 1.50 mg/dL   GFR 100.71 >60.00 mL/min   Calcium 9.5 8.4 - 10.5 mg/dL  Hemoglobin A1c  Result Value Ref Range   Hgb A1c MFr Bld 6.5 4.6 - 6.5 %   Depression screen [facility] 2/9 01/16/2020 05/07/2019 04/03/2018 03/28/2017  Decreased Interest 0 0 0 0  Down, Depressed, Hopeless 0 0 0 0  PHQ - 2 Score 0 0 0 0  Altered sleeping 3 - - -  Tired, decreased energy 1 - - -  Change in appetite 0 - - -  Feeling bad or failure about yourself  0 - - -  Trouble concentrating 0 - - -  Moving slowly or fidgety/restless 0 - - -  Suicidal thoughts 0 - - -  PHQ-9 Score 4 - - -  Difficult doing work/chores Somewhat difficult - - -   No flowsheet data found.  Assessment & Plan:  This visit occurred during the SARS-CoV-2 public health emergency.  Safety protocols were in place, including screening questions prior to the visit, additional usage of staff PPE, and extensive cleaning of exam room while observing appropriate contact time as indicated for disinfecting solutions.   Problem List Items Addressed This Visit    Open wound of skin    Encouraged continued daily dressing changes, keep f/u with derm next week. No signs of bacterial infection at this time.       Adjustment disorder    Improving insomnia with trazodone.  He is able to return to work without restrictions on 02/26/2020.  Letter written.           No orders of the defined types were placed in this encounter.  No orders of the defined types were placed in this encounter.  Patient instructions: Ok to continue trazodone 25-50mg  at night time for sleep.  New letter  with return to work date 02/26/2020 without restrictions.  Return for physical in October.   Follow up plan: Return if symptoms worsen or fail to improve.  Ria Bush, MD

## 2020-02-13 NOTE — Assessment & Plan Note (Signed)
Improving insomnia with trazodone.  He is able to return to work without restrictions on 02/26/2020.  Letter written.

## 2020-02-13 NOTE — Patient Instructions (Addendum)
Ok to continue trazodone 25-50mg  at night time for sleep.  New letter with return to work date 02/26/2020 without restrictions.  Return for physical in October.

## 2020-02-13 NOTE — Assessment & Plan Note (Addendum)
Encouraged continued daily dressing changes, keep f/u with derm next week. No signs of bacterial infection at this time.

## 2020-02-18 ENCOUNTER — Ambulatory Visit (INDEPENDENT_AMBULATORY_CARE_PROVIDER_SITE_OTHER): Payer: BC Managed Care – PPO | Admitting: Dermatology

## 2020-02-18 ENCOUNTER — Other Ambulatory Visit: Payer: Self-pay

## 2020-02-18 DIAGNOSIS — D045 Carcinoma in situ of skin of trunk: Secondary | ICD-10-CM | POA: Diagnosis not present

## 2020-02-18 DIAGNOSIS — D099 Carcinoma in situ, unspecified: Secondary | ICD-10-CM

## 2020-02-18 NOTE — Progress Notes (Signed)
   Follow-Up Visit   Subjective  Eric [name] is a 64 y.o. male who presents for the following: Follow-up biopsy (SCC in situ arising in a condyloma of the superior perirectal area). Pt had previous biopsy showing Condyloma and was treated with Liquid nitrogen destruction and Candida Antigen immunotherapy.  When the lesion stopped improving, re-biopsy showed SCC in situ arising in Condyloma. He presents for discussion of diagnosis and treatment options.  The following portions of the chart were reviewed this encounter and updated as appropriate:  Tobacco  Allergies  Meds  Problems  Med Hx  Surg Hx  Fam Hx     Review of Systems:  No other skin or systemic complaints except as noted in HPI or Assessment and Plan.  Objective  Well appearing patient in no apparent distress; mood and affect are within normal limits.  A focused examination was performed including face, perianal. Relevant physical exam findings are noted in the Assessment and Plan.  Objective  Superior Perianal Area: 2.0cm ulceration   Assessment & Plan    Squamous cell carcinoma in situ Superior Perianal Area Biopsy proven SCC in situ arising in a condyloma.  Previous biopsy of this area showed condyloma.  This was treated with liquid nitrogen destruction and Candida antigen injection immunotherapy.  When the lesion failed to continue to improve, rebiopsy showed squamous cell carcinoma in situ arising within condyloma. Discussed pathology with patient in detail today. Treatment options discussed including excision, topical treatment (imiquimod), versus radiation (not recommended). Recommend re-excision followed by imiquimod treatment. Dr Raliegh Ip will discuss surgery with Dr Terri Piedra. Recommend patient continue regular follow-ups.   Return in about 1 month (around 03/20/2020) for f/u SCC in situ arising in condyloma - perianal.   I, Jamesetta Orleans, CMA, am acting as scribe for Sarina Ser, MD .  Documentation: I  have reviewed the above documentation for accuracy and completeness, and I agree with the above.  Sarina Ser, MD

## 2020-02-21 ENCOUNTER — Encounter: Payer: Self-pay | Admitting: Dermatology

## 2020-02-25 ENCOUNTER — Telehealth: Payer: Self-pay

## 2020-02-25 ENCOUNTER — Other Ambulatory Visit: Payer: Self-pay

## 2020-02-25 DIAGNOSIS — C4492 Squamous cell carcinoma of skin, unspecified: Secondary | ICD-10-CM

## 2020-02-25 NOTE — Telephone Encounter (Signed)
Left message for patient advising him we faxed referral for surgery consult to Dr Hervey Ard. Patient to call back with any questions.

## 2020-02-26 ENCOUNTER — Ambulatory Visit
Admission: EM | Admit: 2020-02-26 | Discharge: 2020-02-26 | Disposition: A | Discharge status: Home / Self Care | Payer: BC Managed Care – PPO | Attending: Emergency Medicine | Admitting: Emergency Medicine

## 2020-02-26 DIAGNOSIS — R197 Diarrhea, unspecified: Secondary | ICD-10-CM

## 2020-02-26 DIAGNOSIS — B349 Viral infection, unspecified: Secondary | ICD-10-CM

## 2020-02-26 LAB — POC SARS CORONAVIRUS 2 AG -  ED: SARS Coronavirus 2 Ag: NEGATIVE

## 2020-02-26 LAB — POCT FASTING CBG KUC MANUAL ENTRY: POCT Glucose (KUC): 187 mg/dL — AB (ref 70–99)

## 2020-02-26 NOTE — Discharge Instructions (Signed)
Keep yourself hydrated with clear liquids.  Follow the attached diarrhea diet.  Take Imodium as directed.    Your rapid COVID test is negative; the send-out test is pending.  You should self quarantine until your test result is back.    Go to the emergency department if you having worsening symptoms.
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
## 2020-02-26 NOTE — ED Triage Notes (Signed)
Patient reports diarrhea, chills, fever, and body aches x24 hours. Reports he is up every 20-30 minutes to the restroom d/t diarrhea.

## 2020-02-27 ENCOUNTER — Telehealth: Payer: Self-pay

## 2020-02-28 ENCOUNTER — Other Ambulatory Visit: Payer: Self-pay

## 2020-02-28 ENCOUNTER — Inpatient Hospital Stay
Admission: EM | Admit: 2020-02-28 | Discharge: 2020-03-02 | DRG: 372 | Disposition: A | Discharge status: Home / Self Care | Payer: BC Managed Care – PPO | Attending: Hospitalist | Admitting: Hospitalist

## 2020-02-28 DIAGNOSIS — I152 Hypertension secondary to endocrine disorders: Secondary | ICD-10-CM | POA: Diagnosis present

## 2020-02-28 DIAGNOSIS — R197 Diarrhea, unspecified: Secondary | ICD-10-CM

## 2020-02-28 DIAGNOSIS — M25512 Pain in left shoulder: Secondary | ICD-10-CM | POA: Diagnosis not present

## 2020-02-28 DIAGNOSIS — Z8249 Family history of ischemic heart disease and other diseases of the circulatory system: Secondary | ICD-10-CM | POA: Diagnosis not present

## 2020-02-28 DIAGNOSIS — K219 Gastro-esophageal reflux disease without esophagitis: Secondary | ICD-10-CM | POA: Diagnosis present

## 2020-02-28 DIAGNOSIS — A020 Salmonella enteritis: Principal | ICD-10-CM | POA: Diagnosis present

## 2020-02-28 DIAGNOSIS — E871 Hypo-osmolality and hyponatremia: Secondary | ICD-10-CM | POA: Diagnosis not present

## 2020-02-28 DIAGNOSIS — I10 Essential (primary) hypertension: Secondary | ICD-10-CM | POA: Diagnosis not present

## 2020-02-28 DIAGNOSIS — R110 Nausea: Secondary | ICD-10-CM | POA: Diagnosis not present

## 2020-02-28 DIAGNOSIS — Z8719 Personal history of other diseases of the digestive system: Secondary | ICD-10-CM

## 2020-02-28 DIAGNOSIS — I2510 Atherosclerotic heart disease of native coronary artery without angina pectoris: Secondary | ICD-10-CM | POA: Diagnosis present

## 2020-02-28 DIAGNOSIS — Z87891 Personal history of nicotine dependence: Secondary | ICD-10-CM | POA: Diagnosis not present

## 2020-02-28 DIAGNOSIS — Z7902 Long term (current) use of antithrombotics/antiplatelets: Secondary | ICD-10-CM | POA: Diagnosis not present

## 2020-02-28 DIAGNOSIS — Z7984 Long term (current) use of oral hypoglycemic drugs: Secondary | ICD-10-CM | POA: Diagnosis not present

## 2020-02-28 DIAGNOSIS — I9589 Other hypotension: Secondary | ICD-10-CM | POA: Diagnosis not present

## 2020-02-28 DIAGNOSIS — Z79899 Other long term (current) drug therapy: Secondary | ICD-10-CM

## 2020-02-28 DIAGNOSIS — N179 Acute kidney failure, unspecified: Secondary | ICD-10-CM | POA: Diagnosis not present

## 2020-02-28 DIAGNOSIS — G8929 Other chronic pain: Secondary | ICD-10-CM | POA: Diagnosis not present

## 2020-02-28 DIAGNOSIS — Z20822 Contact with and (suspected) exposure to covid-19: Secondary | ICD-10-CM | POA: Diagnosis present

## 2020-02-28 DIAGNOSIS — E1169 Type 2 diabetes mellitus with other specified complication: Secondary | ICD-10-CM | POA: Diagnosis present

## 2020-02-28 DIAGNOSIS — Z955 Presence of coronary angioplasty implant and graft: Secondary | ICD-10-CM | POA: Diagnosis not present

## 2020-02-28 DIAGNOSIS — Z8701 Personal history of pneumonia (recurrent): Secondary | ICD-10-CM

## 2020-02-28 DIAGNOSIS — K529 Noninfective gastroenteritis and colitis, unspecified: Secondary | ICD-10-CM | POA: Diagnosis present

## 2020-02-28 DIAGNOSIS — Z7982 Long term (current) use of aspirin: Secondary | ICD-10-CM

## 2020-02-28 DIAGNOSIS — Z801 Family history of malignant neoplasm of trachea, bronchus and lung: Secondary | ICD-10-CM

## 2020-02-28 DIAGNOSIS — E785 Hyperlipidemia, unspecified: Secondary | ICD-10-CM | POA: Diagnosis not present

## 2020-02-28 DIAGNOSIS — M25511 Pain in right shoulder: Secondary | ICD-10-CM | POA: Diagnosis not present

## 2020-02-28 DIAGNOSIS — E1159 Type 2 diabetes mellitus with other circulatory complications: Secondary | ICD-10-CM | POA: Diagnosis present

## 2020-02-28 DIAGNOSIS — E860 Dehydration: Secondary | ICD-10-CM | POA: Diagnosis not present

## 2020-02-28 DIAGNOSIS — E876 Hypokalemia: Secondary | ICD-10-CM | POA: Diagnosis not present

## 2020-02-28 DIAGNOSIS — E118 Type 2 diabetes mellitus with unspecified complications: Secondary | ICD-10-CM | POA: Diagnosis present

## 2020-02-28 DIAGNOSIS — Z85828 Personal history of other malignant neoplasm of skin: Secondary | ICD-10-CM

## 2020-02-28 LAB — COMPREHENSIVE METABOLIC PANEL
ALT: 44 U/L (ref 0–44)
AST: 36 U/L (ref 15–41)
Albumin: 4 g/dL (ref 3.5–5.0)
Alkaline Phosphatase: 42 U/L (ref 38–126)
Anion gap: 15 (ref 5–15)
BUN: 44 mg/dL — ABNORMAL HIGH (ref 8–23)
CO2: 21 mmol/L — ABNORMAL LOW (ref 22–32)
Calcium: 9 mg/dL (ref 8.9–10.3)
Chloride: 92 mmol/L — ABNORMAL LOW (ref 98–111)
Creatinine, Ser: 2.66 mg/dL — ABNORMAL HIGH (ref 0.61–1.24)
GFR calc Af Amer: 29 mL/min — ABNORMAL LOW (ref >60)
GFR calc non Af Amer: 25 mL/min — ABNORMAL LOW (ref >60)
Glucose, Bld: 137 mg/dL — ABNORMAL HIGH (ref 70–99)
Potassium: 3.4 mmol/L — ABNORMAL LOW (ref 3.5–5.1)
Sodium: 128 mmol/L — ABNORMAL LOW (ref 135–145)
Total Bilirubin: 1.2 mg/dL (ref 0.3–1.2)
Total Protein: 8 g/dL (ref 6.5–8.1)

## 2020-02-28 LAB — HEMOGLOBIN A1C
Hgb A1c MFr Bld: 6.9 % — ABNORMAL HIGH (ref 4.8–5.6)
Mean Plasma Glucose: 151.33 mg/dL

## 2020-02-28 LAB — CBC
HCT: 47.1 % (ref 39.0–52.0)
Hemoglobin: 17 g/dL (ref 13.0–17.0)
MCH: 30.6 pg (ref 26.0–34.0)
MCHC: 36.1 g/dL — ABNORMAL HIGH (ref 30.0–36.0)
MCV: 84.9 fL (ref 80.0–100.0)
Platelets: 197 10*3/uL (ref 150–400)
RBC: 5.55 MIL/uL (ref 4.22–5.81)
RDW: 13.8 % (ref 11.5–15.5)
WBC: 6.1 10*3/uL (ref 4.0–10.5)
nRBC: 0 % (ref 0.0–0.2)

## 2020-02-28 LAB — LIPASE, BLOOD: Lipase: 58 U/L — ABNORMAL HIGH (ref 11–51)

## 2020-02-28 LAB — SARS CORONAVIRUS 2 BY RT PCR (HOSPITAL ORDER, PERFORMED IN ~~LOC~~ HOSPITAL LAB): SARS Coronavirus 2: NEGATIVE

## 2020-02-28 LAB — GLUCOSE, CAPILLARY: Glucose-Capillary: 109 mg/dL — ABNORMAL HIGH (ref 70–99)

## 2020-02-28 MED ORDER — ONDANSETRON HCL 4 MG/2ML IJ SOLN: 4.0000 mg | Freq: Four times a day (QID) | INTRAMUSCULAR | Status: DC | PRN

## 2020-02-28 MED ORDER — SODIUM CHLORIDE 0.9 % IV BOLUS
1000.0000 mL | Freq: Once | INTRAVENOUS | Status: AC
  Administered 2020-02-28: 1000 mL via INTRAVENOUS

## 2020-02-28 MED ORDER — INSULIN ASPART 100 UNIT/ML ~~LOC~~ SOLN: 0.0000 [IU] | Freq: Three times a day (TID) | SUBCUTANEOUS | Status: DC

## 2020-02-28 MED ORDER — ACETAMINOPHEN 325 MG PO TABS
650.0000 mg | ORAL_TABLET | Freq: Four times a day (QID) | ORAL | Status: DC | PRN
  Administered 2020-03-01 – 2020-03-02 (×3): 650 mg via ORAL
  Filled 2020-02-28 (×3): qty 2

## 2020-02-28 MED ORDER — ONDANSETRON HCL 4 MG PO TABS: 4.0000 mg | ORAL_TABLET | Freq: Four times a day (QID) | ORAL | Status: DC | PRN

## 2020-02-28 MED ORDER — ASPIRIN EC 81 MG PO TBEC
81.0000 mg | DELAYED_RELEASE_TABLET | Freq: Every day | ORAL | Status: DC
  Administered 2020-02-29 – 2020-03-02 (×3): 81 mg via ORAL
  Filled 2020-02-28 (×5): qty 1

## 2020-02-28 MED ORDER — POTASSIUM CHLORIDE 20 MEQ PO PACK
40.0000 meq | PACK | Freq: Once | ORAL | Status: AC
  Administered 2020-02-28: 40 meq via ORAL
  Filled 2020-02-28: qty 2

## 2020-02-28 MED ORDER — LACTATED RINGERS IV BOLUS
1000.0000 mL | Freq: Once | INTRAVENOUS | Status: AC
  Administered 2020-02-28: 1000 mL via INTRAVENOUS

## 2020-02-28 MED ORDER — CLOPIDOGREL BISULFATE 75 MG PO TABS
75.0000 mg | ORAL_TABLET | Freq: Every day | ORAL | Status: DC
  Administered 2020-02-29 – 2020-03-02 (×3): 75 mg via ORAL
  Filled 2020-02-28 (×5): qty 1

## 2020-02-28 MED ORDER — HEPARIN SODIUM (PORCINE) 5000 UNIT/ML IJ SOLN
5000.0000 [IU] | Freq: Three times a day (TID) | INTRAMUSCULAR | Status: DC
  Administered 2020-02-29 – 2020-03-01 (×7): 5000 [IU] via SUBCUTANEOUS
  Filled 2020-02-28 (×8): qty 1

## 2020-02-28 MED ORDER — ACETAMINOPHEN 650 MG RE SUPP: 650.0000 mg | Freq: Four times a day (QID) | RECTAL | Status: DC | PRN

## 2020-02-28 MED ORDER — SODIUM CHLORIDE 0.9% FLUSH: 3.0000 mL | Freq: Once | INTRAVENOUS | Status: DC

## 2020-02-28 MED ORDER — ROSUVASTATIN CALCIUM 20 MG PO TABS
40.0000 mg | ORAL_TABLET | Freq: Every day | ORAL | Status: DC
  Administered 2020-02-29 – 2020-03-01 (×2): 40 mg via ORAL
  Filled 2020-02-28 (×3): qty 2
  Filled 2020-02-28 (×2): qty 4

## 2020-02-28 MED ORDER — LACTATED RINGERS IV SOLN: INTRAVENOUS | Status: AC

## 2020-02-28 MED ORDER — TRAZODONE HCL 50 MG PO TABS
25.0000 mg | ORAL_TABLET | Freq: Every evening | ORAL | Status: DC | PRN
  Administered 2020-02-29 – 2020-03-01 (×3): 50 mg via ORAL
  Filled 2020-02-28 (×3): qty 1
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
## 2020-02-28 NOTE — ED Notes (Signed)
Pt lightheaded, NT rechecked vitals and now hypotensive. Appears pale.  Will do EKG, place IV and discuss with MD. Pt is alert and oriented.

## 2020-02-28 NOTE — ED Triage Notes (Signed)
Pt arrives via POV for reports of diarrhea since Tuesday. Pt reports fever on Tuesday of 101, denies fever since then. Denies nausea and vomiting. Reports improvement in symptoms since Tuesday but reports still having diarrhea every 45 mins. Pt reports he is drinking fluid and eating a "diarrhea diet" but reports it "goes straight through me". Pt ambulatory from lobby in NAD, skin warm and dry.

## 2020-02-29 DIAGNOSIS — Z801 Family history of malignant neoplasm of trachea, bronchus and lung: Secondary | ICD-10-CM | POA: Diagnosis not present

## 2020-02-29 DIAGNOSIS — G8929 Other chronic pain: Secondary | ICD-10-CM | POA: Diagnosis present

## 2020-02-29 DIAGNOSIS — E860 Dehydration: Secondary | ICD-10-CM | POA: Diagnosis present

## 2020-02-29 DIAGNOSIS — Z87891 Personal history of nicotine dependence: Secondary | ICD-10-CM | POA: Diagnosis not present

## 2020-02-29 DIAGNOSIS — A020 Salmonella enteritis: Secondary | ICD-10-CM | POA: Diagnosis present

## 2020-02-29 DIAGNOSIS — R110 Nausea: Secondary | ICD-10-CM | POA: Diagnosis present

## 2020-02-29 DIAGNOSIS — N179 Acute kidney failure, unspecified: Secondary | ICD-10-CM

## 2020-02-29 DIAGNOSIS — Z955 Presence of coronary angioplasty implant and graft: Secondary | ICD-10-CM | POA: Diagnosis not present

## 2020-02-29 DIAGNOSIS — E871 Hypo-osmolality and hyponatremia: Secondary | ICD-10-CM | POA: Diagnosis present

## 2020-02-29 DIAGNOSIS — Z20822 Contact with and (suspected) exposure to covid-19: Secondary | ICD-10-CM | POA: Diagnosis present

## 2020-02-29 DIAGNOSIS — I152 Hypertension secondary to endocrine disorders: Secondary | ICD-10-CM | POA: Diagnosis present

## 2020-02-29 DIAGNOSIS — E876 Hypokalemia: Secondary | ICD-10-CM | POA: Diagnosis present

## 2020-02-29 DIAGNOSIS — I9589 Other hypotension: Secondary | ICD-10-CM | POA: Diagnosis present

## 2020-02-29 DIAGNOSIS — Z8249 Family history of ischemic heart disease and other diseases of the circulatory system: Secondary | ICD-10-CM | POA: Diagnosis not present

## 2020-02-29 DIAGNOSIS — M25512 Pain in left shoulder: Secondary | ICD-10-CM | POA: Diagnosis present

## 2020-02-29 DIAGNOSIS — M25511 Pain in right shoulder: Secondary | ICD-10-CM | POA: Diagnosis present

## 2020-02-29 DIAGNOSIS — I2510 Atherosclerotic heart disease of native coronary artery without angina pectoris: Secondary | ICD-10-CM | POA: Diagnosis present

## 2020-02-29 DIAGNOSIS — Z7902 Long term (current) use of antithrombotics/antiplatelets: Secondary | ICD-10-CM | POA: Diagnosis not present

## 2020-02-29 DIAGNOSIS — Z7982 Long term (current) use of aspirin: Secondary | ICD-10-CM | POA: Diagnosis not present

## 2020-02-29 DIAGNOSIS — E1169 Type 2 diabetes mellitus with other specified complication: Secondary | ICD-10-CM | POA: Diagnosis present

## 2020-02-29 DIAGNOSIS — Z8719 Personal history of other diseases of the digestive system: Secondary | ICD-10-CM | POA: Diagnosis not present

## 2020-02-29 DIAGNOSIS — E785 Hyperlipidemia, unspecified: Secondary | ICD-10-CM | POA: Diagnosis present

## 2020-02-29 DIAGNOSIS — K219 Gastro-esophageal reflux disease without esophagitis: Secondary | ICD-10-CM | POA: Diagnosis present

## 2020-02-29 DIAGNOSIS — Z7984 Long term (current) use of oral hypoglycemic drugs: Secondary | ICD-10-CM | POA: Diagnosis not present

## 2020-02-29 HISTORY — DX: Salmonella enteritis: A02.0

## 2020-02-29 LAB — GASTROINTESTINAL PANEL BY PCR, STOOL (REPLACES STOOL CULTURE)

## 2020-02-29 LAB — MAGNESIUM
Magnesium: 2 mg/dL (ref 1.7–2.4)
Magnesium: 2.1 mg/dL (ref 1.7–2.4)

## 2020-02-29 LAB — GLUCOSE, CAPILLARY
Glucose-Capillary: 112 mg/dL — ABNORMAL HIGH (ref 70–99)
Glucose-Capillary: 193 mg/dL — ABNORMAL HIGH (ref 70–99)

## 2020-02-29 LAB — BASIC METABOLIC PANEL
Anion gap: 12 (ref 5–15)
BUN: 37 mg/dL — ABNORMAL HIGH (ref 8–23)
CO2: 22 mmol/L (ref 22–32)
Calcium: 8.5 mg/dL — ABNORMAL LOW (ref 8.9–10.3)
Chloride: 98 mmol/L (ref 98–111)
Creatinine, Ser: 1.58 mg/dL — ABNORMAL HIGH (ref 0.61–1.24)
GFR calc Af Amer: 54 mL/min — ABNORMAL LOW (ref >60)
GFR calc non Af Amer: 46 mL/min — ABNORMAL LOW (ref >60)
Glucose, Bld: 110 mg/dL — ABNORMAL HIGH (ref 70–99)
Potassium: 3.4 mmol/L — ABNORMAL LOW (ref 3.5–5.1)
Sodium: 132 mmol/L — ABNORMAL LOW (ref 135–145)

## 2020-02-29 LAB — HIV ANTIBODY (ROUTINE TESTING W REFLEX): HIV Screen 4th Generation wRfx: NONREACTIVE

## 2020-02-29 LAB — C DIFFICILE QUICK SCREEN W PCR REFLEX
C Diff antigen: NEGATIVE
C Diff interpretation: NOT DETECTED
C Diff toxin: NEGATIVE

## 2020-02-29 MED ORDER — LACTATED RINGERS IV SOLN: INTRAVENOUS | Status: DC

## 2020-02-29 NOTE — Progress Notes (Signed)
PROGRESS NOTE    Eric [name]  FMB:[id number] DOB: 20-Jun-1956 DOA: 02/28/2020 PCP: Ria Bush, MD    Assessment & Plan:   Principal Problem:   AKI (acute kidney injury) ([place]) Active Problems:   Hyperlipidemia associated with type 2 diabetes mellitus ([place])   CAD (coronary artery disease)   Diabetes mellitus type 2, controlled, with complications ([place])   Gastroenteritis   Hypertension associated with diabetes ([place])   Hypokalemia   Hyponatremia   Salmonella gastroenteritis    Eric [name] is a 64 y.o. male with medical history significant for CAD s/p PCI, T2DM, HTN, HLD, history of diverticulitis, and chronic bilateral shoulder pain who presents to the ED for evaluation of persistent diarrhea.   Acute kidney injury: Prerenal secondary to dehydration from excess GI losses. -s/p IVF boluses f/b MIVF, with improvement in Cr PLAN: --continue LR@125  --continue to hold home HCTZ and losartan  Salmonella Gastroenteritis: --C diff neg, GI path pos for Salmonella PLAN: --continue supportive treatment --no need for anti-microbials --can have regular diet as tolerated  Hypokalemia: Secondary to GI losses.   --Recheck and replete PRN  Hyponatremia: Secondary to dehydration.   Na improved to 132 from 128 with IVF. PLAN: --continue LR@125   Hx of Hypertension: Hypotensive due to dehydration.   --continue LR@125  --continue to hold home HCTZ and losartan  CAD s/p PCI: Chronic and stable.  Denies any chest pain.  Continue aspirin, Plavix, and rosuvastatin.  T2DM: --A1c 6.9 Holding home Metformin in setting of AKI.  --d/c BG checks and SSI since A1c within goal.  Hyperlipidemia: Continue rosuvastatin.   DVT prophylaxis: Heparin SQ Code Status: Full code  Family Communication:  Status is: change to inpatient Dispo:   The patient is from: home Anticipated d/c is to: home Anticipated d/c date is: tomorrow Patient currently is not medically  stable to d/c due to: AKI from dehydration, still having diarrhea, needs IVF hydration.   Subjective and Interval History:  Pt reported diarrhea slowing down.  No N/V, abdominal pain.     Objective: Vitals:   02/28/20 2000 02/28/20 2249 02/29/20 0426 02/29/20 0714  BP: 94/62 110/66 109/68 113/69  Pulse: 78 79 72 70  Resp: 20 20 20 16   Temp:  98.6 F (37 C) 98 F (36.7 C) 98.1 F (36.7 C)  TempSrc:  Oral Oral Oral  SpO2: 94% 93% 94% 92%  Weight:  102.8 kg    Height:  5\' 9"  (1.753 m)      Intake/Output Summary (Last 24 hours) at 02/29/2020 1716 Last data filed at 02/29/2020 0400 Gross per 24 hour  Intake 396.7 ml  Output --  Net 396.7 ml   Filed Weights   02/28/20 1612 02/28/20 2249  Weight: 106.6 kg 102.8 kg    Examination:   Constitutional: NAD, AAOx3 HEENT: conjunctivae and lids normal, EOMI CV: RRR no M,R,G. Distal pulses +2.  No cyanosis.   RESP: CTA B/L, normal respiratory effort  GI: +BS, NTND, soft Extremities: No effusions, edema, or tenderness in BLE SKIN: warm, dry and intact Neuro: II - XII grossly intact.  Sensation intact Psych: Normal mood and affect.  Appropriate judgement and reason   Data Reviewed: I have personally reviewed following labs and imaging studies  CBC: Recent Labs  Lab 02/28/20 1617  WBC 6.1  HGB 17.0  HCT 47.1  MCV 84.9  PLT 701   Basic Metabolic Panel: Recent Labs  Lab 02/28/20 1617 02/28/20 2309 02/29/20 0458  NA 128*  --  132*  K 3.4*  --  3.4*  CL 92*  --  98  CO2 21*  --  22  GLUCOSE 137*  --  110*  BUN 44*  --  37*  CREATININE 2.66*  --  1.58*  CALCIUM 9.0  --  8.5*  MG  --  2.0 2.1   GFR: Estimated Creatinine Clearance: 57.3 mL/min (A) (by C-G formula based on SCr of 1.58 mg/dL (H)). Liver Function Tests: Recent Labs  Lab 02/28/20 1617  AST 36  ALT 44  ALKPHOS 42  BILITOT 1.2  PROT 8.0  ALBUMIN 4.0   Recent Labs  Lab 02/28/20 1617  LIPASE 58*   No results for input(s): AMMONIA in the last  168 hours. Coagulation Profile: No results for input(s): INR, PROTIME in the last 168 hours. Cardiac Enzymes: No results for input(s): CKTOTAL, CKMB, CKMBINDEX, TROPONINI in the last 168 hours. BNP (last 3 results) No results for input(s): PROBNP in the last 8760 hours. HbA1C: Recent Labs    02/28/20 2025  HGBA1C 6.9*   CBG: Recent Labs  Lab 02/28/20 2301 02/29/20 0715 02/29/20 1152  GLUCAP 109* 112* 193*   Lipid Profile: No results for input(s): CHOL, HDL, LDLCALC, TRIG, CHOLHDL, LDLDIRECT in the last 72 hours. Thyroid Function Tests: No results for input(s): TSH, T4TOTAL, FREET4, T3FREE, THYROIDAB in the last 72 hours. Anemia Panel: No results for input(s): VITAMINB12, FOLATE, FERRITIN, TIBC, IRON, RETICCTPCT in the last 72 hours. Sepsis Labs: No results for input(s): PROCALCITON, LATICACIDVEN in the last 168 hours.  Recent Results (from the past 240 hour(s))  C Difficile Quick Screen w PCR reflex     Status: None   Collection Time: 02/28/20 12:53 AM   Specimen: STOOL  Result Value Ref Range Status   C Diff antigen NEGATIVE NEGATIVE Final   C Diff toxin NEGATIVE NEGATIVE Final   C Diff interpretation No C. difficile detected.  Final    Comment: Performed at [facility], [address] [place],  [address]  SARS Coronavirus 2 by RT PCR (hospital order, performed in [facility] hospital lab) Nasopharyngeal Nasopharyngeal Swab     Status: None   Collection Time: 02/28/20  7:33 PM   Specimen: Nasopharyngeal Swab  Result Value Ref Range Status   SARS Coronavirus 2 NEGATIVE NEGATIVE Final    Comment: (NOTE) SARS-CoV-2 target nucleic acids are NOT DETECTED.  The SARS-CoV-2 RNA is generally detectable in upper and lower respiratory specimens during the acute phase of infection. The lowest concentration of SARS-CoV-2 viral copies this assay can detect is 250 copies / mL. A negative result does not preclude SARS-CoV-2 infection and should not be used as  the sole basis for treatment or other patient management decisions.  A negative result may occur with improper specimen collection / handling, submission of specimen other than nasopharyngeal swab, presence of viral mutation(s) within the areas targeted by this assay, and inadequate number of viral copies (<250 copies / mL). A negative result must be combined with clinical observations, patient history, and epidemiological information.  Fact Sheet for Patients:   StrictlyIdeas.no  Fact Sheet for Healthcare Providers: BankingDealers.co.za  This test is not yet approved or  cleared by the [place] FDA and has been authorized for detection and/or diagnosis of SARS-CoV-2 by FDA under an Emergency Use Authorization (EUA).  This EUA will remain in effect (meaning this test can be used) for the duration of the COVID-19 declaration under Section 564(b)(1) of the Act, 21 U.S.C. section 360bbb-3(b)(1), unless  the authorization is terminated or revoked sooner.  Performed at [facility], [address] [place], [place] [address]   Gastrointestinal Panel by PCR , Stool     Status: Abnormal   Collection Time: 02/29/20 12:53 AM   Specimen: Stool  Result Value Ref Range Status   Campylobacter species NOT DETECTED NOT DETECTED Final   Plesimonas shigelloides NOT DETECTED NOT DETECTED Final   Salmonella species DETECTED (A) NOT DETECTED Final    Comment: RESULT CALLED TO, READ BACK BY AND VERIFIED WITH: STEVEN SYKES ON 02/29/20 AT 0516 QSD    Yersinia enterocolitica NOT DETECTED NOT DETECTED Final   Vibrio species NOT DETECTED NOT DETECTED Final   Vibrio cholerae NOT DETECTED NOT DETECTED Final   Enteroaggregative E coli (EAEC) NOT DETECTED NOT DETECTED Final   Enteropathogenic E coli (EPEC) NOT DETECTED NOT DETECTED Final   Enterotoxigenic E coli (ETEC) NOT DETECTED NOT DETECTED Final   Shiga like toxin producing E coli (STEC) NOT  DETECTED NOT DETECTED Final   Shigella/Enteroinvasive E coli (EIEC) NOT DETECTED NOT DETECTED Final   Cryptosporidium NOT DETECTED NOT DETECTED Final   Cyclospora cayetanensis NOT DETECTED NOT DETECTED Final   Entamoeba histolytica NOT DETECTED NOT DETECTED Final   Giardia lamblia NOT DETECTED NOT DETECTED Final   Adenovirus F40/41 NOT DETECTED NOT DETECTED Final   Astrovirus NOT DETECTED NOT DETECTED Final   Norovirus GI/GII NOT DETECTED NOT DETECTED Final   Rotavirus A NOT DETECTED NOT DETECTED Final   Sapovirus (I, II, IV, and V) NOT DETECTED NOT DETECTED Final    Comment: Performed at [facility], [address], [place], [place] [address]      Radiology Studies: No results found.   Scheduled Meds: . aspirin EC  81 mg Oral Daily  . clopidogrel  75 mg Oral Daily  . heparin  5,000 Units Subcutaneous Q8H  . rosuvastatin  40 mg Oral q1800  . sodium chloride flush  3 mL Intravenous Once   Continuous Infusions:   LOS: 0 days     Enzo Bi, MD [facility] If 7PM-7AM, please contact night-coverage 02/29/2020, 5:16 PM

## 2020-03-01 LAB — CBC
HCT: 39.8 % (ref 39.0–52.0)
Hemoglobin: 13.9 g/dL (ref 13.0–17.0)
MCH: 30.3 pg (ref 26.0–34.0)
MCHC: 34.9 g/dL (ref 30.0–36.0)
MCV: 86.7 fL (ref 80.0–100.0)
Platelets: 175 10*3/uL (ref 150–400)
RBC: 4.59 MIL/uL (ref 4.22–5.81)
RDW: 13.5 % (ref 11.5–15.5)
WBC: 6.2 10*3/uL (ref 4.0–10.5)
nRBC: 0 % (ref 0.0–0.2)

## 2020-03-01 LAB — MAGNESIUM: Magnesium: 1.8 mg/dL (ref 1.7–2.4)

## 2020-03-01 LAB — BASIC METABOLIC PANEL
Anion gap: 10 (ref 5–15)
BUN: 14 mg/dL (ref 8–23)
CO2: 28 mmol/L (ref 22–32)
Calcium: 9 mg/dL (ref 8.9–10.3)
Chloride: 98 mmol/L (ref 98–111)
Creatinine, Ser: 0.86 mg/dL (ref 0.61–1.24)
GFR calc Af Amer: >60 mL/min (ref >60)
GFR calc non Af Amer: >60 mL/min (ref >60)
Glucose, Bld: 166 mg/dL — ABNORMAL HIGH (ref 70–99)
Potassium: 3.3 mmol/L — ABNORMAL LOW (ref 3.5–5.1)
Sodium: 136 mmol/L (ref 135–145)

## 2020-03-01 MED ORDER — PANTOPRAZOLE SODIUM 40 MG PO TBEC
40.0000 mg | DELAYED_RELEASE_TABLET | Freq: Every day | ORAL | Status: DC
  Administered 2020-03-01 – 2020-03-02 (×2): 40 mg via ORAL
  Filled 2020-03-01 (×2): qty 1

## 2020-03-01 MED ORDER — FAMOTIDINE 20 MG PO TABS
20.0000 mg | ORAL_TABLET | Freq: Two times a day (BID) | ORAL | Status: DC
  Administered 2020-03-01 – 2020-03-02 (×2): 20 mg via ORAL
  Filled 2020-03-01 (×2): qty 1

## 2020-03-01 MED ORDER — CYCLOBENZAPRINE HCL 10 MG PO TABS: 10.0000 mg | ORAL_TABLET | Freq: Three times a day (TID) | ORAL | Status: DC | PRN

## 2020-03-01 MED ORDER — LORATADINE 10 MG PO TABS
10.0000 mg | ORAL_TABLET | Freq: Every day | ORAL | Status: DC
  Administered 2020-03-01 – 2020-03-02 (×2): 10 mg via ORAL
  Filled 2020-03-01 (×2): qty 1

## 2020-03-01 MED ORDER — POTASSIUM CHLORIDE CRYS ER 20 MEQ PO TBCR
40.0000 meq | EXTENDED_RELEASE_TABLET | Freq: Once | ORAL | Status: AC
  Administered 2020-03-01: 40 meq via ORAL
  Filled 2020-03-01: qty 2

## 2020-03-01 MED ORDER — FAMOTIDINE 20 MG PO TABS: 20.0000 mg | ORAL_TABLET | Freq: Two times a day (BID) | ORAL | Status: DC

## 2020-03-01 MED ORDER — DIAZEPAM 5 MG PO TABS: 5.0000 mg | ORAL_TABLET | Freq: Two times a day (BID) | ORAL | Status: DC | PRN

## 2020-03-01 NOTE — Progress Notes (Signed)
PROGRESS NOTE    KASPAR ALBORNOZ  UXN:[id number] DOB: [birth year] DOA: 02/28/2020 PCP: Ria Bush, MD    Assessment & Plan:   Principal Problem:   AKI (acute kidney injury) ([place]) Active Problems:   Hyperlipidemia associated with type 2 diabetes mellitus ([place])   CAD (coronary artery disease)   Diabetes mellitus type 2, controlled, with complications ([place])   Gastroenteritis   Hypertension associated with diabetes ([place])   Hypokalemia   Hyponatremia   Salmonella gastroenteritis    FIELDING MAULT is a 64 y.o. Caucasian male with medical history significant for CAD s/p PCI, T2DM, HTN, HLD, history of diverticulitis, and chronic bilateral shoulder pain who presents to the ED for evaluation of persistent diarrhea.   Acute kidney injury: Prerenal secondary to dehydration from excess GI losses. -s/p IVF boluses f/b MIVF, with improvement in Cr.  Cr back to baseline today. PLAN: --d/c MIVF today to prepare for discharge  --continue to hold home HCTZ and losartan --encourage oral hydration  Salmonella Gastroenteritis: --C diff neg, GI path pos for Salmonella PLAN: --continue supportive treatment --no need for anti-microbials --continue regular diet --encourage oral hydration with Gatorade   Hypokalemia: Secondary to GI losses.   --Replete with oral potassium PRN  Hyponatremia: Secondary to dehydration.   Na improved to 132 from 128 with IVF. PLAN: --d/c MIVF today to prepare for discharge  --encourage oral hydration  Hx of Hypertension: Hypotensive due to dehydration.   --continue to hold home HCTZ and losartan  CAD s/p PCI: Chronic and stable.  Denies any chest pain.  Continue aspirin, Plavix, and rosuvastatin.  T2DM: --A1c 6.9 Holding home Metformin in setting of AKI.  --No need for BG checks and SSI since A1c within goal.  Hyperlipidemia: Continue rosuvastatin.  GERD --continue home pepcid and PPI    DVT prophylaxis: Heparin SQ Code  Status: Full code  Family Communication:  Status is: change to inpatient Dispo:   The patient is from: home Anticipated d/c is to: home Anticipated d/c date is: tomorrow Patient currently is not medically stable to d/c due to: AKI from dehydration, still having diarrhea, will hold IVF today to ensure stable kidney function before discharge tomorrow.   Subjective and Interval History:  Pt reported continued diarrhea, especially after he ate.  No fever, no significant abdominal pain.   Objective: Vitals:   02/29/20 1938 03/01/20 0600 03/01/20 0601 03/01/20 1234  BP: 125/69 120/70  127/77  Pulse: 79 77 78 73  Resp: 18 18  14   Temp: 97.8 F (36.6 C) 98.5 F (36.9 C)  98 F (36.7 C)  TempSrc: Oral Oral  Oral  SpO2: 96% 92% 93% 93%  Weight:      Height:        Intake/Output Summary (Last 24 hours) at 03/01/2020 1455 Last data filed at 03/01/2020 1400 Gross per 24 hour  Intake 3350.83 ml  Output 400 ml  Net 2950.83 ml   Filed Weights   02/28/20 1612 02/28/20 2249  Weight: 106.6 kg 102.8 kg    Examination:   Constitutional: NAD, AAOx3 HEENT: conjunctivae and lids normal, EOMI CV: No cyanosis.   RESP: normal respiratory effort  Extremities: No effusions, edema in BLE Neuro: II - XII grossly intact.   Psych: Normal mood and affect.  Appropriate judgement and reason   Data Reviewed: I have personally reviewed following labs and imaging studies  CBC: Recent Labs  Lab 02/28/20 1617 03/01/20 0524  WBC 6.1 6.2  HGB 17.0 13.9  HCT 47.1  39.8  MCV 84.9 86.7  PLT 197 725   Basic Metabolic Panel: Recent Labs  Lab 02/28/20 1617 02/28/20 2309 02/29/20 0458 03/01/20 0524  NA 128*  --  132* 136  K 3.4*  --  3.4* 3.3*  CL 92*  --  98 98  CO2 21*  --  22 28  GLUCOSE 137*  --  110* 166*  BUN 44*  --  37* 14  CREATININE 2.66*  --  1.58* 0.86  CALCIUM 9.0  --  8.5* 9.0  MG  --  2.0 2.1 1.8   GFR: Estimated Creatinine Clearance: 105.2 mL/min (by C-G formula based on  SCr of 0.86 mg/dL). Liver Function Tests: Recent Labs  Lab 02/28/20 1617  AST 36  ALT 44  ALKPHOS 42  BILITOT 1.2  PROT 8.0  ALBUMIN 4.0   Recent Labs  Lab 02/28/20 1617  LIPASE 58*   No results for input(s): AMMONIA in the last 168 hours. Coagulation Profile: No results for input(s): INR, PROTIME in the last 168 hours. Cardiac Enzymes: No results for input(s): CKTOTAL, CKMB, CKMBINDEX, TROPONINI in the last 168 hours. BNP (last 3 results) No results for input(s): PROBNP in the last 8760 hours. HbA1C: Recent Labs    02/28/20 2025  HGBA1C 6.9*   CBG: Recent Labs  Lab 02/28/20 2301 02/29/20 0715 02/29/20 1152  GLUCAP 109* 112* 193*   Lipid Profile: No results for input(s): CHOL, HDL, LDLCALC, TRIG, CHOLHDL, LDLDIRECT in the last 72 hours. Thyroid Function Tests: No results for input(s): TSH, T4TOTAL, FREET4, T3FREE, THYROIDAB in the last 72 hours. Anemia Panel: No results for input(s): VITAMINB12, FOLATE, FERRITIN, TIBC, IRON, RETICCTPCT in the last 72 hours. Sepsis Labs: No results for input(s): PROCALCITON, LATICACIDVEN in the last 168 hours.  Recent Results (from the past 240 hour(s))  C Difficile Quick Screen w PCR reflex     Status: None   Collection Time: 02/28/20 12:53 AM   Specimen: STOOL  Result Value Ref Range Status   C Diff antigen NEGATIVE NEGATIVE Final   C Diff toxin NEGATIVE NEGATIVE Final   C Diff interpretation No C. difficile detected.  Final    Comment: Performed at [facility], [address] [place], [place] [address]  SARS Coronavirus 2 by RT PCR (hospital order, performed in [facility] hospital lab) Nasopharyngeal Nasopharyngeal Swab     Status: None   Collection Time: 02/28/20  7:33 PM   Specimen: Nasopharyngeal Swab  Result Value Ref Range Status   SARS Coronavirus 2 NEGATIVE NEGATIVE Final    Comment: (NOTE) SARS-CoV-2 target nucleic acids are NOT DETECTED.  The SARS-CoV-2 RNA is generally detectable in upper and  lower respiratory specimens during the acute phase of infection. The lowest concentration of SARS-CoV-2 viral copies this assay can detect is 250 copies / mL. A negative result does not preclude SARS-CoV-2 infection and should not be used as the sole basis for treatment or other patient management decisions.  A negative result may occur with improper specimen collection / handling, submission of specimen other than nasopharyngeal swab, presence of viral mutation(s) within the areas targeted by this assay, and inadequate number of viral copies (<250 copies / mL). A negative result must be combined with clinical observations, patient history, and epidemiological information.  Fact Sheet for Patients:   StrictlyIdeas.no  Fact Sheet for Healthcare Providers: BankingDealers.co.za  This test is not yet approved or  cleared by the [place] FDA and has been authorized for detection and/or diagnosis  of SARS-CoV-2 by FDA under an Emergency Use Authorization (EUA).  This EUA will remain in effect (meaning this test can be used) for the duration of the COVID-19 declaration under Section 564(b)(1) of the Act, 21 U.S.C. section 360bbb-3(b)(1), unless the authorization is terminated or revoked sooner.  Performed at [facility], [address] [place], [place] [address]   Gastrointestinal Panel by PCR , Stool     Status: Abnormal   Collection Time: 02/29/20 12:53 AM   Specimen: Stool  Result Value Ref Range Status   Campylobacter species NOT DETECTED NOT DETECTED Final   Plesimonas shigelloides NOT DETECTED NOT DETECTED Final   Salmonella species DETECTED (A) NOT DETECTED Final    Comment: RESULT CALLED TO, READ BACK BY AND VERIFIED WITH: STEVEN SYKES ON 02/29/20 AT 0516 QSD    Yersinia enterocolitica NOT DETECTED NOT DETECTED Final   Vibrio species NOT DETECTED NOT DETECTED Final   Vibrio cholerae NOT DETECTED NOT DETECTED Final     Enteroaggregative E coli (EAEC) NOT DETECTED NOT DETECTED Final   Enteropathogenic E coli (EPEC) NOT DETECTED NOT DETECTED Final   Enterotoxigenic E coli (ETEC) NOT DETECTED NOT DETECTED Final   Shiga like toxin producing E coli (STEC) NOT DETECTED NOT DETECTED Final   Shigella/Enteroinvasive E coli (EIEC) NOT DETECTED NOT DETECTED Final   Cryptosporidium NOT DETECTED NOT DETECTED Final   Cyclospora cayetanensis NOT DETECTED NOT DETECTED Final   Entamoeba histolytica NOT DETECTED NOT DETECTED Final   Giardia lamblia NOT DETECTED NOT DETECTED Final   Adenovirus F40/41 NOT DETECTED NOT DETECTED Final   Astrovirus NOT DETECTED NOT DETECTED Final   Norovirus GI/GII NOT DETECTED NOT DETECTED Final   Rotavirus A NOT DETECTED NOT DETECTED Final   Sapovirus (I, II, IV, and V) NOT DETECTED NOT DETECTED Final    Comment: Performed at [facility], [address], [place],  [address]      Radiology Studies: No results found.   Scheduled Meds: . aspirin EC  81 mg Oral Daily  . clopidogrel  75 mg Oral Daily  . heparin  5,000 Units Subcutaneous Q8H  . rosuvastatin  40 mg Oral q1800  . sodium chloride flush  3 mL Intravenous Once   Continuous Infusions:   LOS: 1 day     Enzo Bi, MD [facility] If 7PM-7AM, please contact night-coverage 03/01/2020, 2:55 PM

## 2020-03-02 LAB — BASIC METABOLIC PANEL
Anion gap: 8 (ref 5–15)
BUN: 11 mg/dL (ref 8–23)
CO2: 29 mmol/L (ref 22–32)
Calcium: 8.9 mg/dL (ref 8.9–10.3)
Chloride: 101 mmol/L (ref 98–111)
Creatinine, Ser: 0.76 mg/dL (ref 0.61–1.24)
GFR calc Af Amer: >60 mL/min (ref >60)
GFR calc non Af Amer: >60 mL/min (ref >60)
Glucose, Bld: 147 mg/dL — ABNORMAL HIGH (ref 70–99)
Potassium: 3.4 mmol/L — ABNORMAL LOW (ref 3.5–5.1)
Sodium: 138 mmol/L (ref 135–145)

## 2020-03-02 LAB — CBC
HCT: 38.4 % — ABNORMAL LOW (ref 39.0–52.0)
Hemoglobin: 14.1 g/dL (ref 13.0–17.0)
MCH: 31.1 pg (ref 26.0–34.0)
MCHC: 36.7 g/dL — ABNORMAL HIGH (ref 30.0–36.0)
MCV: 84.6 fL (ref 80.0–100.0)
Platelets: 188 10*3/uL (ref 150–400)
RBC: 4.54 MIL/uL (ref 4.22–5.81)
RDW: 13.5 % (ref 11.5–15.5)
WBC: 6 10*3/uL (ref 4.0–10.5)
nRBC: 0 % (ref 0.0–0.2)

## 2020-03-02 LAB — MAGNESIUM: Magnesium: 1.8 mg/dL (ref 1.7–2.4)

## 2020-03-02 MED ORDER — LOSARTAN POTASSIUM 100 MG PO TABS: ORAL_TABLET | ORAL | 4 refills | Status: DC

## 2020-03-02 MED ORDER — POTASSIUM CHLORIDE CRYS ER 20 MEQ PO TBCR
40.0000 meq | EXTENDED_RELEASE_TABLET | Freq: Once | ORAL | Status: AC
  Administered 2020-03-02: 40 meq via ORAL
  Filled 2020-03-02: qty 2

## 2020-03-02 MED ORDER — HYDROCHLOROTHIAZIDE 25 MG PO TABS: ORAL_TABLET | ORAL | 4 refills | Status: DC

## 2020-03-02 MED ORDER — POTASSIUM CHLORIDE CRYS ER 20 MEQ PO TBCR: 40.0000 meq | EXTENDED_RELEASE_TABLET | Freq: Every day | ORAL | 0 refills | Status: DC

## 2020-03-02 NOTE — Progress Notes (Signed)
Nutrition Brief Note  Patient identified on the Malnutrition Screening Tool (MST) Report  64 y.o.Caucasian malewith medical history significant forCAD s/p PCI,T2DM, HTN, HLD, history of diverticulitis, and chronic bilateral shoulder pain who presents to the ED for evaluation of persistent diarrhea and was found to have Salmonella Gastroenteritis   Wt Readings from Last 15 Encounters:  02/28/20 102.8 kg  02/13/20 (!) 106.8 kg  01/16/20 109.4 kg  12/26/19 106.9 kg  11/19/19 109.3 kg  11/12/19 109.8 kg  05/07/19 111.8 kg  04/30/19 111.2 kg  10/02/18 111.6 kg  04/03/18 105 kg  03/06/18 105.3 kg  09/26/17 118.6 kg  08/01/17 114.8 kg  03/28/17 113.7 kg  12/27/16 112.5 kg    Body mass index is 33.46 kg/m. Patient meets criteria for obesity based on current BMI. Per chart, pt down 10lbs(4%) since 7/22; RD suspects some weight loss is r/t fluid losses. Pt appears fairly weight stable at baseline.   Current diet order is regular, patient is consuming approximately 100% of meals at this time. Labs and medications reviewed.   Pt to discharge today. No nutrition interventions warranted at this time. If nutrition issues arise, please consult RD.   Koleen Distance MS, RD, LDN Please refer to [facility] for RD and/or RD on-call/weekend/after hours pager

## 2020-03-02 NOTE — Discharge Summary (Addendum)
Physician Discharge Summary   Eric [name]  male DOB: [birth year]  VHQ:[id number]  PCP: Eric Bush, MD  Admit date: 02/28/2020 Discharge date: 03/02/2020  Admitted From: home Disposition:  home CODE STATUS: Full code  Discharge Instructions    Discharge instructions   Complete by: As directed    You have Salmonella infection.  Since your diarrhea is improving, and you are maintaining oral hydration without needing IVF for 24 hours, you can go home to recover.  Please hold your blood pressure medications until you follow up with your outpatient doctor because of your diarrhea illness, acute kidney injury (which is now resolved) and normal blood pressure with them during your hospitalization.  Because of the diarrhea, you are loosing some electrolytes.  Please continue to take potassium supplements as directed for 5 days.  Please follow up with your primary care doctor within a week after discharge to check your electrolytes.   Dr. Enzo [name] [facility] Course:  For full details, please see H&P, progress notes, consult notes and ancillary notes.  Briefly,  Eric [name] a 64 y.o.Caucasian malewith medical history significant forCAD s/p PCI,T2DM, HTN, HLD, history of diverticulitis, and chronic bilateral shoulder pain who presents to the ED for evaluation of persistent diarrhea.   Acute kidney injury Prerenal secondary to dehydration from excess GI losses.  Pt received IVF boluses f/b MIVF, with improvement in Cr.  Cr back to baseline prior to discharge.  Cr remained stable for 24 hours without MIVF prior to discharge.  Home HCTZ and losartan held until outpatient followup.  Salmonella Gastroenteritis: C diff neg, GI path pos for Salmonella.  Pt received supportive treatment with IVF.  No need for abx since Salmonella gastroenteritis typically self-resolves with supportive treatment.  Diarrhea improved prior to discharge, and pt was able to maintain  hydration with oral intake for 24 hours prior to discharge.  Hypokalemia: Secondary to GI losses. Repleted with oral potassium PRN.  Potassium supplement was prescribed for 5 more days after discharge.  Pt was advised to check potassium level with PCP a week after discharge.  Hyponatremia, resolved Secondary to dehydration. Na level normalized after IVF hydration.  Hx of Hypertension: Hypotensive due to dehydration.  continued to hold home HCTZ and losartan at discharge pending PCP followup.  CAD s/p PCI: Chronic and stable. Denied any chest pain. Continued aspirin, Plavix, and rosuvastatin.  T2DM: A1c 6.9.  No need for BG checks and SSI since A1c within goal.  Hyperlipidemia: Continued rosuvastatin.  GERD continued home pepcid and PPI   Discharge Diagnoses:  Principal Problem:   AKI (acute kidney injury) ([place]) Active Problems:   Hyperlipidemia associated with type 2 diabetes mellitus ([place])   CAD (coronary artery disease)   Diabetes mellitus type 2, controlled, with complications ([place])   Gastroenteritis   Hypertension associated with diabetes ([place])   Hypokalemia   Hyponatremia   Salmonella gastroenteritis    Discharge Instructions:  Allergies as of 03/02/2020      Reactions   Other Swelling   All grains - angioedema - saw allergist.  Managed with daily antihistamine rather than avoidance   Soy Allergy Swelling   Angioedema - saw allergist  Managed with daily antihistamine rather than avoidance      Medication List    STOP taking these medications   ciprofloxacin 500 MG tablet Commonly known as: CIPRO   HYDROcodone-acetaminophen 5-325 MG tablet Commonly known as: Norco   metroNIDAZOLE 500 MG  tablet Commonly known as: Flagyl     TAKE these medications   aspirin EC 81 MG tablet Take 1 tablet (81 mg total) by mouth daily.   Centrum Silver tablet Take 1 tablet by mouth daily.   cholecalciferol 25 MCG (1000 UNIT) tablet Commonly known as:  VITAMIN D3 Take 1,000 Units by mouth daily.   CITRUCEL PO Take by mouth. As needed   clopidogrel 75 MG tablet Commonly known as: PLAVIX Take 1 tablet (75 mg total) by mouth daily.   Coenzyme Q10 200 MG capsule Take 200 mg by mouth daily.   cyclobenzaprine 10 MG tablet Commonly known as: FLEXERIL Take 1 tablet (10 mg total) by mouth 3 (three) times daily as needed for muscle spasms.   diazepam 5 MG tablet Commonly known as: VALIUM Take 1 tablet (5 mg total) by mouth every 12 (twelve) hours as needed for anxiety or sedation (sleep).   diphenhydrAMINE 25 MG tablet Commonly known as: BENADRYL Take 25 mg by mouth every 6 (six) hours as needed.   EPINEPHrine 0.3 mg/0.3 mL Soaj injection Commonly known as: EPI-PEN INJECT INTRAMUSCULARLY AS DIRECTED   famotidine 20 MG tablet Commonly known as: PEPCID TAKE 1 TABLET BY MOUTH TWICE DAILY   fexofenadine 180 MG tablet Commonly known as: ALLEGRA Take 1 tablet (180 mg total) by mouth daily.   Fish Oil Concentrate 1000 MG Caps Take three tablets daily.   hydrochlorothiazide 25 MG tablet Commonly known as: HYDRODIURIL Hold until your outpatient doctor followup due to your diarrhea and acute kidney injury. What changed:   how much to take  how to take this  when to take this  additional instructions   losartan 100 MG tablet Commonly known as: COZAAR Hold until your outpatient doctor followup due to your diarrhea and acute kidney injury. What changed:   how much to take  how to take this  when to take this  additional instructions   metFORMIN 500 MG tablet Commonly known as: GLUCOPHAGE TAKE 3 TABLETS BY MOUTH EVERY MORNING WITH BREAKFAST   nitroGLYCERIN 0.4 MG SL tablet Commonly known as: NITROSTAT PLACE 1 TABLET UNDER THE TONGUE IF NEEDED EVERY 5 MINUTES FOR CHEST PAIN FOR 3 DOSES IF NO RELIEF AFER FIRST DOSE CALL PRESCRIBER OR 911   NON FORMULARY Vioprex 1,400 take two tablets daily for joints.   omeprazole  40 MG capsule Commonly known as: PRILOSEC Take 1 capsule (40 mg total) by mouth daily.   potassium chloride SA 20 MEQ tablet Commonly known as: KLOR-CON Take 2 tablets (40 mEq total) by mouth daily for 5 days. Can stop after your diarrhea resolves.   rosuvastatin 40 MG tablet Commonly known as: CRESTOR Take 1 tablet (40 mg total) by mouth daily.   sildenafil 20 MG tablet Commonly known as: Revatio Take 1 tablet (20 mg total) by mouth 3 (three) times daily as needed.   STOOL SOFTENER PO Take by mouth daily.   traMADol 50 MG tablet Commonly known as: ULTRAM Take 1 tablet (50 mg total) by mouth every 6 (six) hours as needed.   traZODone 50 MG tablet Commonly known as: DESYREL Take 0.5-1 tablets (25-50 mg total) by mouth at bedtime as needed for sleep.   vitamin C 1000 MG tablet Take 1,000 mg by mouth daily.        Follow-up Information    Eric Bush, MD. Schedule an appointment as soon as possible for a visit in 1 week(s).   Specialty: Family Medicine Contact information: [address]  [place] [address] [phone]               Allergies  Allergen Reactions  . Other Swelling    All grains - angioedema - saw allergist.  Managed with daily antihistamine rather than avoidance  . Soy Allergy Swelling    Angioedema - saw allergist  Managed with daily antihistamine rather than avoidance     The results of significant diagnostics from this hospitalization (including imaging, microbiology, ancillary and laboratory) are listed below for reference.   Consultations:   Procedures/Studies: No results found.    Labs: BNP (last 3 results) No results for input(s): BNP in the last 8760 hours. Basic Metabolic Panel: Recent Labs  Lab 02/28/20 1617 02/28/20 2309 02/29/20 0458 03/01/20 0524 03/02/20 0437  NA 128*  --  132* 136 138  K 3.4*  --  3.4* 3.3* 3.4*  CL 92*  --  98 98 101  CO2 21*  --  22 28 29   GLUCOSE 137*  --  110* 166* 147*    BUN 44*  --  37* 14 11  CREATININE 2.66*  --  1.58* 0.86 0.76  CALCIUM 9.0  --  8.5* 9.0 8.9  MG  --  2.0 2.1 1.8 1.8   Liver Function Tests: Recent Labs  Lab 02/28/20 1617  AST 36  ALT 44  ALKPHOS 42  BILITOT 1.2  PROT 8.0  ALBUMIN 4.0   Recent Labs  Lab 02/28/20 1617  LIPASE 58*   No results for input(s): AMMONIA in the last 168 hours. CBC: Recent Labs  Lab 02/28/20 1617 03/01/20 0524 03/02/20 0437  WBC 6.1 6.2 6.0  HGB 17.0 13.9 14.1  HCT 47.1 39.8 38.4*  MCV 84.9 86.7 84.6  PLT 197 175 188   Cardiac Enzymes: No results for input(s): CKTOTAL, CKMB, CKMBINDEX, TROPONINI in the last 168 hours. BNP: Invalid input(s): POCBNP CBG: Recent Labs  Lab 02/28/20 2301 02/29/20 0715 02/29/20 1152  GLUCAP 109* 112* 193*   D-Dimer No results for input(s): DDIMER in the last 72 hours. Hgb A1c Recent Labs    02/28/20 2025  HGBA1C 6.9*   Lipid Profile No results for input(s): CHOL, HDL, LDLCALC, TRIG, CHOLHDL, LDLDIRECT in the last 72 hours. Thyroid function studies No results for input(s): TSH, T4TOTAL, T3FREE, THYROIDAB in the last 72 hours.  Invalid input(s): FREET3 Anemia work up No results for input(s): VITAMINB12, FOLATE, FERRITIN, TIBC, IRON, RETICCTPCT in the last 72 hours. Urinalysis    Component Value Date/Time   COLORURINE STRAW (A) 11/04/2015 1710   APPEARANCEUR CLEAR (A) 11/04/2015 1710   APPEARANCEUR Clear 04/19/2014 1612   LABSPEC 1.011 11/04/2015 1710   LABSPEC 1.005 04/19/2014 1612   PHURINE 7.0 11/04/2015 1710   GLUCOSEU NEGATIVE 11/04/2015 1710   GLUCOSEU Negative 04/19/2014 1612   HGBUR NEGATIVE 11/04/2015 1710   BILIRUBINUR Negative 11/24/2015 1505   BILIRUBINUR Negative 04/19/2014 1612   KETONESUR NEGATIVE 11/04/2015 1710   PROTEINUR Negative 11/24/2015 1505   PROTEINUR NEGATIVE 11/04/2015 1710   UROBILINOGEN 0.2 11/24/2015 1505   NITRITE Negative 11/24/2015 1505   NITRITE NEGATIVE 11/04/2015 1710   LEUKOCYTESUR Negative  11/24/2015 1505   LEUKOCYTESUR Negative 04/19/2014 1612   Sepsis Labs Invalid input(s): PROCALCITONIN,  WBC,  LACTICIDVEN Microbiology Recent Results (from the past 240 hour(s))  C Difficile Quick Screen w PCR reflex     Status: None   Collection Time: 02/28/20 12:53 AM   Specimen: STOOL  Result Value Ref Range Status  C Diff antigen NEGATIVE NEGATIVE Final   C Diff toxin NEGATIVE NEGATIVE Final   C Diff interpretation No C. difficile detected.  Final    Comment: Performed at [facility], [address] [place], [place] [address]  SARS Coronavirus 2 by RT PCR (hospital order, performed in [facility] hospital lab) Nasopharyngeal Nasopharyngeal Swab     Status: None   Collection Time: 02/28/20  7:33 PM   Specimen: Nasopharyngeal Swab  Result Value Ref Range Status   SARS Coronavirus 2 NEGATIVE NEGATIVE Final    Comment: (NOTE) SARS-CoV-2 target nucleic acids are NOT DETECTED.  The SARS-CoV-2 RNA is generally detectable in upper and lower respiratory specimens during the acute phase of infection. The lowest concentration of SARS-CoV-2 viral copies this assay can detect is 250 copies / mL. A negative result does not preclude SARS-CoV-2 infection and should not be used as the sole basis for treatment or other patient management decisions.  A negative result may occur with improper specimen collection / handling, submission of specimen other than nasopharyngeal swab, presence of viral mutation(s) within the areas targeted by this assay, and inadequate number of viral copies (<250 copies / mL). A negative result must be combined with clinical observations, patient history, and epidemiological information.  Fact Sheet for Patients:   StrictlyIdeas.no  Fact Sheet for Healthcare Providers: BankingDealers.co.za  This test is not yet approved or  cleared by the [place] FDA and has been authorized for detection and/or  diagnosis of SARS-CoV-2 by FDA under an Emergency Use Authorization (EUA).  This EUA will remain in effect (meaning this test can be used) for the duration of the COVID-19 declaration under Section 564(b)(1) of the Act, 21 U.S.C. section 360bbb-3(b)(1), unless the authorization is terminated or revoked sooner.  Performed at [facility], [address] [place], [place] [address]   Gastrointestinal Panel by PCR , Stool     Status: Abnormal   Collection Time: 02/29/20 12:53 AM   Specimen: Stool  Result Value Ref Range Status   Campylobacter species NOT DETECTED NOT DETECTED Final   Plesimonas shigelloides NOT DETECTED NOT DETECTED Final   Salmonella species DETECTED (A) NOT DETECTED Final    Comment: RESULT CALLED TO, READ BACK BY AND VERIFIED WITH: STEVEN SYKES ON 02/29/20 AT 0516 QSD    Yersinia enterocolitica NOT DETECTED NOT DETECTED Final   Vibrio species NOT DETECTED NOT DETECTED Final   Vibrio cholerae NOT DETECTED NOT DETECTED Final   Enteroaggregative E coli (EAEC) NOT DETECTED NOT DETECTED Final   Enteropathogenic E coli (EPEC) NOT DETECTED NOT DETECTED Final   Enterotoxigenic E coli (ETEC) NOT DETECTED NOT DETECTED Final   Shiga like toxin producing E coli (STEC) NOT DETECTED NOT DETECTED Final   Shigella/Enteroinvasive E coli (EIEC) NOT DETECTED NOT DETECTED Final   Cryptosporidium NOT DETECTED NOT DETECTED Final   Cyclospora cayetanensis NOT DETECTED NOT DETECTED Final   Entamoeba histolytica NOT DETECTED NOT DETECTED Final   Giardia lamblia NOT DETECTED NOT DETECTED Final   Adenovirus F40/41 NOT DETECTED NOT DETECTED Final   Astrovirus NOT DETECTED NOT DETECTED Final   Norovirus GI/GII NOT DETECTED NOT DETECTED Final   Rotavirus A NOT DETECTED NOT DETECTED Final   Sapovirus (I, II, IV, and V) NOT DETECTED NOT DETECTED Final    Comment: Performed at [facility], [address] [place], [place] [address]     Total time spend on discharging this  patient, including the last patient exam, discussing the hospital  stay, instructions for ongoing care as it relates to all pertinent caregivers, as well as preparing the medical discharge records, prescriptions, and/or referrals as applicable, is 30 minutes.    Eric Bi, MD  [facility] 03/02/2020, 8:45 AM  If 7PM-7AM, please contact night-coverage

## 2020-03-03 ENCOUNTER — Telehealth: Payer: Self-pay

## 2020-03-03 NOTE — Telephone Encounter (Signed)
Called [facility] to follow up on patient's referral/appointment. Left message asking them to return call with patient's appt, JS

## 2020-03-04 ENCOUNTER — Telehealth: Payer: Self-pay

## 2020-03-04 NOTE — Telephone Encounter (Signed)
Sharyn Lull from Dr. Festus Aloe office returned my call about patient's referral appointment. They were unable to reach him on 02/25/20 but they will call him back to try and get him scheduled, JS

## 2020-03-05 DIAGNOSIS — C44529 Squamous cell carcinoma of skin of other part of trunk: Secondary | ICD-10-CM | POA: Diagnosis not present

## 2020-03-05 DIAGNOSIS — A029 Salmonella infection, unspecified: Secondary | ICD-10-CM | POA: Diagnosis not present

## 2020-03-06 ENCOUNTER — Encounter: Payer: Self-pay | Admitting: Family Medicine

## 2020-03-06 ENCOUNTER — Ambulatory Visit (INDEPENDENT_AMBULATORY_CARE_PROVIDER_SITE_OTHER): Payer: BC Managed Care – PPO | Admitting: Family Medicine

## 2020-03-06 ENCOUNTER — Other Ambulatory Visit: Payer: Self-pay

## 2020-03-06 VITALS — BP 152/80 | HR 73 | Temp 98.0°F | Ht 69.0 in | Wt 236.4 lb

## 2020-03-06 DIAGNOSIS — A020 Salmonella enteritis: Secondary | ICD-10-CM

## 2020-03-06 DIAGNOSIS — N179 Acute kidney failure, unspecified: Secondary | ICD-10-CM | POA: Diagnosis not present

## 2020-03-06 DIAGNOSIS — E118 Type 2 diabetes mellitus with unspecified complications: Secondary | ICD-10-CM

## 2020-03-06 DIAGNOSIS — I10 Essential (primary) hypertension: Secondary | ICD-10-CM | POA: Diagnosis not present

## 2020-03-06 LAB — RENAL FUNCTION PANEL
Albumin: 3.9 g/dL (ref 3.5–5.2)
BUN: 7 mg/dL (ref 6–23)
CO2: 31 mEq/L (ref 19–32)
Calcium: 9.4 mg/dL (ref 8.4–10.5)
Chloride: 103 mEq/L (ref 96–112)
Creatinine, Ser: 0.94 mg/dL (ref 0.40–1.50)
GFR: 81.11 mL/min (ref >60.00)
Glucose, Bld: 126 mg/dL — ABNORMAL HIGH (ref 70–99)
Phosphorus: 3.2 mg/dL (ref 2.3–4.6)
Potassium: 5 mEq/L (ref 3.5–5.1)
Sodium: 139 mEq/L (ref 135–145)

## 2020-03-06 NOTE — Assessment & Plan Note (Signed)
Prerenal hypovolemia related. Resolved upon discharge. Update labs today.

## 2020-03-06 NOTE — Patient Instructions (Addendum)
Labs today.  Continue pushing fluids, slowly advance diet as tolerated.  Restart losartan 100mg  daily. Hold hydrochlorothiazide 25mg  unless blood pressures start trending up (>140/90).  Ok to restart metformin.  Let me know if you need new FMLA filled out.

## 2020-03-06 NOTE — Assessment & Plan Note (Signed)
BP elevated - will slowly restart prior antihypertensive regimen - continue losartan 100mg  daily (started yesterday), and restart hctz 25mg  if BP remains high. Pt agrees with plan.

## 2020-03-06 NOTE — Assessment & Plan Note (Signed)
Has restarted metformin, tolerating.

## 2020-03-06 NOTE — Assessment & Plan Note (Signed)
Currently on cipro 500mg  bid x 10d course for salmonella enteritis. Significantly improved. Reviewed advancing diet as tolerated.

## 2020-03-06 NOTE — Progress Notes (Signed)
This visit was conducted in person.  BP (!) 152/80 (BP Location: Right Arm, Patient Position: Sitting, Cuff Size: Large)   Pulse 73   Temp 98 F (36.7 C) (Temporal)   Ht 5\' 9"  (1.753 m)   Wt 236 lb 6 oz (107.2 kg)   SpO2 95%   BMI 34.91 kg/m    CC: hosp f/u visit  Subjective:    Patient ID: Eric [name], male    DOB: June 24, 1956, 64 y.o.   MRN: [id number]  HPI: Eric [name] is a 64 y.o. male presenting on 03/06/2020 for Hospitalization Follow-up (States BP meds were stopped by hospital doc.  Once home BP was running about 139/70.  However, this morning, BP was 146/89.  So pt took BP med. )   Recent hospitalization for 4d watery febrile diarrhea complicated by ARF with hypotension. Treated in hospital with holding antihypertensives and IVF rehydration with imodium PRN. Rapid COVID test negative. GI pathogen panel returned positive for salmonella. Diarrhea was improving upon discharge from hospital.   Saw Dr Fleet Contras yesterday started on ciprofloxacin 10d course.   On discharge, labs largely improved except for persistent hypokalemia with K 3.4.   BP trending up over last 2 days.  Hasn't been back to work yet.  Hospital records reviewed.   Hospitalized 02/28/2020-03/02/2020 No TCM hosp f/u phone call.   Assessment/Plan Principal Problem:   AKI (acute kidney injury) ([place]) Active Problems:   Hyperlipidemia associated with type 2 diabetes mellitus ([place])   CAD (coronary artery disease)   Diabetes mellitus type 2, controlled, with complications ([place])   Gastroenteritis   Hypertension associated with diabetes ([place])   Hypokalemia   Hyponatremia     Relevant past medical, surgical, family and social history reviewed and updated as indicated. Interim medical history since our last visit reviewed. Allergies and medications reviewed and updated. Outpatient Medications Prior to Visit  Medication Sig Dispense Refill  . Ascorbic Acid (VITAMIN C) 1000 MG tablet Take 1,000 mg  by mouth daily.    [name] aspirin EC 81 MG tablet Take 1 tablet (81 mg total) by mouth daily. 90 tablet 3  . cholecalciferol (VITAMIN D3) 25 MCG (1000 UT) tablet Take 1,000 Units by mouth daily.    . ciprofloxacin (CIPRO) 500 MG tablet Take 500 mg by mouth 2 (two) times daily.    . clopidogrel (PLAVIX) 75 MG tablet Take 1 tablet (75 mg total) by mouth daily. 90 tablet 4  . Coenzyme Q10 200 MG capsule Take 200 mg by mouth daily.    . cyclobenzaprine (FLEXERIL) 10 MG tablet Take 1 tablet (10 mg total) by mouth 3 (three) times daily as needed for muscle spasms. 30 tablet 1  . diazepam (VALIUM) 5 MG tablet Take 1 tablet (5 mg total) by mouth every 12 (twelve) hours as needed for anxiety or sedation (sleep). 30 tablet 0  . diphenhydrAMINE (BENADRYL) 25 MG tablet Take 25 mg by mouth every 6 (six) hours as needed.    [name] Calcium (STOOL SOFTENER PO) Take by mouth daily.    [name] EPINEPHRINE 0.3 mg/0.3 mL IJ SOAJ injection INJECT INTRAMUSCULARLY AS DIRECTED 2 Device 1  . famotidine (PEPCID) 20 MG tablet TAKE 1 TABLET BY MOUTH TWICE DAILY 180 tablet 0  . fexofenadine (ALLEGRA) 180 MG tablet Take 1 tablet (180 mg total) by mouth daily. 90 tablet 4  . metFORMIN (GLUCOPHAGE) 500 MG tablet TAKE 3 TABLETS BY MOUTH EVERY MORNING WITH BREAKFAST 270 tablet 3  . Methylcellulose, Laxative, (  CITRUCEL PO) Take by mouth. As needed    . Multiple Vitamins-Minerals (CENTRUM SILVER) tablet Take 1 tablet by mouth daily.      . nitroGLYCERIN (NITROSTAT) 0.4 MG SL tablet PLACE 1 TABLET UNDER THE TONGUE IF NEEDED EVERY 5 MINUTES FOR CHEST PAIN FOR 3 DOSES IF NO RELIEF AFER FIRST DOSE CALL PRESCRIBER OR 911 25 tablet 3  . NON FORMULARY Vioprex 1,400 take two tablets daily for joints.    . Omega-3 Fatty Acids (FISH OIL CONCENTRATE) 1000 MG CAPS Take three tablets daily.    [name] omeprazole (PRILOSEC) 40 MG capsule Take 1 capsule (40 mg total) by mouth daily. 90 capsule 3  . potassium chloride SA (KLOR-CON) 20 MEQ tablet Take 2 tablets  (40 mEq total) by mouth daily for 5 days. Can stop after your diarrhea resolves. 10 tablet 0  . rosuvastatin (CRESTOR) 40 MG tablet Take 1 tablet (40 mg total) by mouth daily. 90 tablet 4  . sildenafil (REVATIO) 20 MG tablet Take 1 tablet (20 mg total) by mouth 3 (three) times daily as needed. 90 tablet 6  . traMADol (ULTRAM) 50 MG tablet Take 1 tablet (50 mg total) by mouth every 6 (six) hours as needed. 20 tablet 1  . traZODone (DESYREL) 50 MG tablet Take 0.5-1 tablets (25-50 mg total) by mouth at bedtime as needed for sleep. 30 tablet 3  . hydrochlorothiazide (HYDRODIURIL) 25 MG tablet Hold until your outpatient doctor followup due to your diarrhea and acute kidney injury. (Patient not taking: Reported on 03/06/2020) 90 tablet 4  . losartan (COZAAR) 100 MG tablet Hold until your outpatient doctor followup due to your diarrhea and acute kidney injury. (Patient not taking: Reported on 03/06/2020) 90 tablet 4   No facility-administered medications prior to visit.     Per HPI unless specifically indicated in ROS section below Review of Systems Objective:  BP (!) 152/80 (BP Location: Right Arm, Patient Position: Sitting, Cuff Size: Large)   Pulse 73   Temp 98 F (36.7 C) (Temporal)   Ht 5\' 9"  (1.753 m)   Wt 236 lb 6 oz (107.2 kg)   SpO2 95%   BMI 34.91 kg/m   Wt Readings from Last 3 Encounters:  03/06/20 236 lb 6 oz (107.2 kg)  02/28/20 226 lb 9.6 oz (102.8 kg)  02/13/20 (!) 235 lb 6 oz (106.8 kg)      Physical Exam Vitals and nursing note reviewed.  Constitutional:      Appearance: Normal appearance. He is not ill-appearing.  Cardiovascular:     Rate and Rhythm: Normal rate and regular rhythm.     Pulses: Normal pulses.     Heart sounds: Normal heart sounds. No murmur heard.   Pulmonary:     Effort: Pulmonary effort is normal. No respiratory distress.     Breath sounds: Normal breath sounds. No wheezing, rhonchi or rales.  Abdominal:     General: Abdomen is flat. Bowel sounds  are normal. There is no distension.     Palpations: Abdomen is soft. There is no mass.     Tenderness: There is no abdominal tenderness. There is no right CVA tenderness, left CVA tenderness, guarding or rebound.     Hernia: No hernia is present.  Musculoskeletal:     Right lower leg: No edema.     Left lower leg: No edema.  Skin:    General: Skin is warm and dry.     Findings: No rash.  Neurological:     Mental  Status: He is alert.  Psychiatric:        Mood and Affect: Mood normal.        Behavior: Behavior normal.       Lab Results  Component Value Date   CREATININE 0.76 03/02/2020   BUN 11 03/02/2020   NA 138 03/02/2020   K 3.4 (L) 03/02/2020   CL 101 03/02/2020   CO2 29 03/02/2020    Lab Results  Component Value Date   HGBA1C 6.9 (H) 02/28/2020    Assessment & Plan:  This visit occurred during the SARS-CoV-2 public health emergency.  Safety protocols were in place, including screening questions prior to the visit, additional usage of staff PPE, and extensive cleaning of exam room while observing appropriate contact time as indicated for disinfecting solutions.   Problem List Items Addressed This Visit    Salmonella gastroenteritis - Primary    Currently on cipro 500mg  bid x 10d course for salmonella enteritis. Significantly improved. Reviewed advancing diet as tolerated.       HYPERTENSION, BENIGN    BP elevated - will slowly restart prior antihypertensive regimen - continue losartan 100mg  daily (started yesterday), and restart hctz 25mg  if BP remains high. Pt agrees with plan.       Diabetes mellitus type 2, controlled, with complications ([place])    Has restarted metformin, tolerating.       AKI (acute kidney injury) ([place])    Prerenal hypovolemia related. Resolved upon discharge. Update labs today.       Relevant Orders   Renal function panel       No orders of the defined types were placed in this encounter.  Orders Placed This Encounter  Procedures  .  Renal function panel    Patient Instructions  Labs today.  Continue pushing fluids, slowly advance diet as tolerated.  Restart losartan 100mg  daily. Hold hydrochlorothiazide 25mg  unless blood pressures start trending up (>140/90).  Ok to restart metformin.  Let me know if you need new FMLA filled out.    Follow up plan: Return if symptoms worsen or fail to improve.  Ria Bush, MD

## 2020-03-09 ENCOUNTER — Ambulatory Visit: Payer: BC Managed Care – PPO | Admitting: Dermatology

## 2020-03-16 ENCOUNTER — Ambulatory Visit: Payer: BC Managed Care – PPO | Admitting: Dermatology

## 2020-04-01 ENCOUNTER — Other Ambulatory Visit: Payer: Self-pay | Admitting: Family Medicine

## 2020-04-01 NOTE — Telephone Encounter (Signed)
EpiPen Last filled:  07/09/19, #2 device Last OV:  03/06/20, hosp f/u Next OV:  05/19/20, CPE

## 2020-04-02 ENCOUNTER — Other Ambulatory Visit: Payer: Self-pay | Admitting: Family Medicine

## 2020-04-04 ENCOUNTER — Other Ambulatory Visit: Payer: Self-pay | Admitting: Family Medicine

## 2020-04-07 ENCOUNTER — Telehealth: Payer: Self-pay | Admitting: *Deleted

## 2020-04-07 NOTE — Telephone Encounter (Signed)
Pharmacist called stating that they got a denial on patient's epi-pen showing that the refill had been sent by other means. Pharmacist stated that they never got a refill on the epi-pen. Pharmacist was advised that script was sent in on 04/03/20 for 4 each by Dr. Danise Mina.  Pharmacist was given the script by phone as documented in [place].

## 2020-04-08 ENCOUNTER — Telehealth: Payer: Self-pay | Admitting: Family Medicine

## 2020-04-08 DIAGNOSIS — E118 Type 2 diabetes mellitus with unspecified complications: Secondary | ICD-10-CM

## 2020-04-08 NOTE — Addendum Note (Signed)
Addended by: Ria Bush on: 04/08/2020 06:00 PM   Modules accepted: Orders

## 2020-04-08 NOTE — Telephone Encounter (Signed)
Lab Results  Component Value Date   HGBA1C 6.9 (H) 02/28/2020   Last A1c was just last month - will [facility] not accept that one?  If desired may come in for new POC A1c at his convenience, but he may have to pay for it. I have ordered.

## 2020-04-08 NOTE — Telephone Encounter (Signed)
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  Please advise

## 2020-04-09 NOTE — Telephone Encounter (Signed)
Spoke with pt relaying Dr. Synthia Innocent message.  Pt agrees to getting copy of recent A1c.  He will pick up today.  [Placed copy of lab at front office- yellow folders.]

## 2020-04-13 ENCOUNTER — Encounter: Payer: Self-pay | Admitting: *Deleted

## 2020-04-14 ENCOUNTER — Other Ambulatory Visit: Payer: Self-pay | Admitting: General Surgery

## 2020-04-14 DIAGNOSIS — C44529 Squamous cell carcinoma of skin of other part of trunk: Secondary | ICD-10-CM | POA: Diagnosis not present

## 2020-04-14 DIAGNOSIS — H40003 Preglaucoma, unspecified, bilateral: Secondary | ICD-10-CM | POA: Diagnosis not present

## 2020-04-14 NOTE — Progress Notes (Signed)
Subjective:     Patient ID: Eric [name] is a 64 y.o. male.  HPI  The following portions of the patient's history were reviewed and updated as appropriate.  This an established patient is here today for: office visit. 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  He states he is not having any pain. He admits to bleeding only with BM but not every BM.  The patient has had resolution of his colitis secondary to Salmonella.  At the time of his March 05, 2020 visit he was placed on Cipro, 500 mg p.o. twice daily which she has tolerated well.  He reports with perianal cleansing he wipes from "front to back".  This is been discouraged to minimize contamination of the site of ulceration as well as to minimize the chance of impairing healing post excision.  He has been using soft wipes for perianal cleansing with good result.       Chief Complaint  Patient presents with  . Follow-up    squamous cell ca perineum     BP 158/78   Pulse 78   Temp 36.3 C (97.4 F)   Ht 175.3 cm (5\' 9" )   Wt (!) 108 kg (238 lb)   SpO2 94%   BMI 35.15 kg/m       Past Medical History:  Diagnosis Date  . Arthritis   . Coronary atherosclerosis of unspecified type of vessel, native or graft   . Diabetes (CMS-HCC)    type 2  . Diverticulitis 2009, 2012, 2015  . Diverticulosis 03/14/2016  . Diverticulosis of colon (without mention of hemorrhage)   . Essential hypertension, benign   . Glaucoma suspect   . History of chicken pox   . History of diverticulitis   . History of pneumonia   . Hyperlipidemia   . Other abnormal glucose   . Pure hypercholesterolemia   . Squamous cell carcinoma of skin 01/20/2020  . Tubular adenoma of colon, unspecified 03/14/2016          Past Surgical History:  Procedure Laterality Date  . ANGIOPLASTY / STENTING FEMORAL  03/2007  . COLONOSCOPY  2011, 03-14-16   Tubular adenoma of  colon/Diverticulosis/Repeat 43yrs/MUS  . facial injury   2008   facial fracture  . TONSILLECTOMY         Social History          Socioeconomic History  . Marital status: Single    Spouse name: Not on file  . Number of children: Not on file  . Years of education: Not on file  . Highest education level: Not on file  Occupational History  . Not on file  Tobacco Use  . Smoking status: Former Smoker    Types: Cigarettes  . Smokeless tobacco: Never Used  . Tobacco comment: stopped about 5 years ago.  Substance and Sexual Activity  . Alcohol use: Yes    Comment: occassional  . Drug use: Not on file  . Sexual activity: Not on file  Other Topics Concern  . Not on file  Social History Narrative  . Not on file   Social Determinants of Health      Financial Resource Strain:   . Difficulty of Paying Living Expenses:   Food Insecurity:   . Worried About Charity fundraiser in the Last Year:   . Arboriculturist in the Last Year:   Transportation Needs:   . Lack of Transportation (  Medical):   [name] Lack of Transportation (Non-Medical):             Allergies  Allergen Reactions  . Other Swelling    All grains - angioedema - saw allergist.  Managed with daily antihistamine rather than avoidance  . Soy Swelling    Angioedema - saw allergist  Managed with daily antihistamine rather than avoidance    Current Medications        Current Outpatient Medications  Medication Sig Dispense Refill  . aspirin 81 MG EC tablet Take 162 mg by mouth daily.    [name] CARTILAGE/COLLAGEN/BOR/HYALUR (JOINT HEALTH ORAL) Take by mouth once daily.      . clopidogrel (PLAVIX) 75 mg tablet Take 75 mg by mouth daily.    [name] co-enzyme Q-10, ubiquinone, 200 mg capsule Take by mouth.    . cyclobenzaprine (FLEXERIL) 10 MG tablet Take 1 tablet (10 mg total) by mouth nightly as needed for Muscle spasms as needed 30 tablet 0  . diazePAM (VALIUM) 5 MG tablet Take by mouth as  directed    . diphenhydrAMINE (BENADRYL) 25 mg capsule Take 25 mg by mouth nightly as needed for Itching.    . docusate calcium (SURFAK) 240 mg capsule Take by mouth.    . EPINEPHrine (EPIPEN) 0.3 mg/0.3 mL pen injector   0  . fexofenadine (ALLEGRA) 180 MG tablet Take 180 mg by mouth once daily  3  . fluticasone propionate (FLONASE) 50 mcg/actuation nasal spray   0  . hydrochlorothiazide (HYDRODIURIL) 25 MG tablet Take 25 mg by mouth once daily       . HYDROcodone-acetaminophen (NORCO) 5-325 mg tablet Take 1 tablet by mouth 2 (two) times daily (Patient taking differently: Take 1 tablet by mouth every 6 (six) hours as needed   ) 10 tablet 0  . hydrocortisone 2.5 % cream APPLY ON SKIN HS UNTIL CLEAR THEN UP TO 4 DAYS A WEEK IF NEEDED  0  . ketoconazole (NIZORAL) 2 % cream APPLY ON THE SKIN AT BEDTIME ONCE IMPROVE IF NEEDED FOR UP TO 4 DAYS A WEEK  0  . losartan (COZAAR) 100 MG tablet Take 100 mg by mouth once daily     11  . metFORMIN (GLUCOPHAGE) 500 MG tablet take 3 tablets by mouth once daily with BREAKFAST    . methylcellulose (CITRUCEL) 500 mg tablet Take by mouth    . multivitamin (MULTIVITAMIN) tablet Take 1 tablet by mouth daily.    . nitroGLYcerin (NITROSTAT) 0.4 MG SL tablet Place 0.4 mg under the tongue every 5 (five) minutes as needed. May take up to 3 doses.    . NON FORMULARY Vioprex 1,400 take two tablets daily for joints.    . OMEGA-3/DHA/EPA/FISH OIL (FISH OIL-OMEGA-3 FATTY ACIDS) 300-1,000 mg capsule Take 2 g by mouth daily.    [name] omeprazole (PRILOSEC) 40 MG DR capsule Take 1 capsule (40 mg total) by mouth daily. 30 capsule 3  . rosuvastatin (CRESTOR) 40 MG tablet Take 40 mg by mouth daily.    . sildenafil (REVATIO) 20 mg tablet Take by mouth as directed    . traMADol (ULTRAM) 50 mg tablet as needed.    . traZODone (DESYREL) 50 MG tablet Take by mouth as directed    . ciprofloxacin HCl (CIPRO) 500 MG tablet Take by mouth 2 (two) times daily  (Patient not taking: Reported on 04/14/2020  )    . dicyclomine (BENTYL) 20 mg tablet Take by mouth. (Patient not taking: Reported on 04/14/2020  )    .  famotidine (PEPCID) 20 MG tablet 2 (two) times daily    . metroNIDAZOLE (FLAGYL) 500 MG tablet Take 500 mg by mouth 3 (three) times daily. (Patient not taking: Reported on 04/14/2020  )  0   No current facility-administered medications for this visit.           Family History  Problem Relation Age of Onset  . Cancer Mother        lung and liver  . High blood pressure (Hypertension) Other   . High blood pressure (Hypertension) Father   . Pacemaker Father   . High blood pressure (Hypertension) Sister   . Coronary Artery Disease (Blocked arteries around heart) Brother      Review of Systems  Constitutional: Negative for chills and fever.  Respiratory: Negative for cough.        Objective:   Physical Exam Constitutional:      General: He is not in acute distress.    Appearance: Normal appearance. He is obese. He is not ill-appearing.  HENT:     Head: Normocephalic.  Eyes:     General: No scleral icterus. Cardiovascular:     Rate and Rhythm: Normal rate and regular rhythm.  Pulmonary:     Effort: Pulmonary effort is normal.     Breath sounds: Normal breath sounds.  Genitourinary:   Musculoskeletal:     Cervical back: Normal range of motion and neck supple.  Lymphadenopathy:     Lower Body: No right inguinal adenopathy. No left inguinal adenopathy.  Neurological:     Mental Status: He is alert and oriented to person, place, and time.  Psychiatric:        Mood and Affect: Mood normal.        Behavior: Behavior normal.    Labs and Radiology:   From his March 05, 2020 office note:  Records submitted by dermatology show the recent biopsy completed on January 21, 2020 showed's in situ squamous cell carcinoma arising in a condyloma. The peripheral margin was involved. Records describe previous  biopsy showing condyloma which was treated with liquid nitrogen and Candida antigen immunotherapy.     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    Plan:     The patient will get a "doughnut" seat and see if this helps him be more comfortable than he was after his original biopsy with dermatology.  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  Plans are for excision and primary closure.  While there is reasonable spacing between the lesion and the anus, we will plan to complete an anoscopy under anesthesia to confirm no proximal lesion.  We will have the patient hold his Plavix for 5 days prior to the procedure.  The importance of wiping from back to front was emphasized.  Surgery to be scheduled for 05-01-20 at [facility].     Entered by Ledell Noss, CMA, acting as a scribe for Dr. Hervey Ard, MD.   The documentation recorded by the scribe accurately reflects the service I personally performed and the decisions made by me.   Robert Bellow, MD FACS

## 2020-04-21 ENCOUNTER — Other Ambulatory Visit: Payer: Self-pay

## 2020-04-21 ENCOUNTER — Encounter
Admission: RE | Admit: 2020-04-21 | Discharge: 2020-04-21 | Disposition: A | Discharge status: Home / Self Care | Payer: BC Managed Care – PPO | Source: Ambulatory Visit | Attending: General Surgery | Admitting: General Surgery

## 2020-04-21 HISTORY — DX: Gastro-esophageal reflux disease without esophagitis: K21.9

## 2020-04-21 NOTE — Patient Instructions (Signed)
Your procedure is scheduled [id number] 10/8 Report to Day Surgery. To find out your arrival time please call [phone] between 1PM - 3PM on Thurs 10/7  Remember: Instructions that are not followed completely may result in serious medical risk,  up to and including death, or upon the discretion of your surgeon and anesthesiologist your  surgery may need to be rescheduled.     _X__ 1. Do not eat food after midnight the night before your procedure.                 No chewing gum or hard candies. You may drink clear liquids up to 2 hours                 before you are scheduled to arrive for your surgery- DO not drink clear                 liquids within 2 hours of the start of your surgery.                 Clear Liquids include:  water,  G2 or                  Gatorade Zero (avoid Red/Purple/Blue), Black Coffee or Tea (Do not add                 anything to coffee or tea). _____2.   Complete the "Ensure Clear Pre-surgery Clear Carbohydrate Drink" provided to you, 2 hours before arrival. **If you       are diabetic you will be provided with an alternative drink, Gatorade Zero or G2.  __X__2.  On the morning of surgery brush your teeth with toothpaste and water, you                may rinse your mouth with mouthwash if you wish.  Do not swallow any toothpaste of mouthwash.     _X__ 3.  No Alcohol for 24 hours before or after surgery.   ___ 4.  Do Not Smoke or use e-cigarettes For 24 Hours Prior to Your Surgery.                 Do not use any chewable tobacco products for at least 6 hours prior to                 Surgery.  ___  5.  Do not use any recreational drugs (marijuana, cocaine, heroin, ecstasy, MDMA or other)                For at least one week prior to your surgery.  Combination of these drugs with anesthesia                May have life threatening results.  ____  6.  Bring all medications with you on the day of surgery if instructed.   __x__  7.   Notify your doctor if there is any change in your medical condition      (cold, fever, infections).     Do not wear jewelry, Do not wear lotions,  You may wear deodorant. Do not shave 48 hours prior to surgery. Men may shave face and neck. Do not bring valuables to the hospital.    [facility] is not responsible for any belongings or valuables.  Contacts, dentures or bridgework may not be worn into surgery. Leave your suitcase in the car. After surgery it may be brought to your room. For  patients admitted to the hospital, discharge time is determined by your treatment team.   Patients discharged the day of surgery will not be allowed to drive home.   Make arrangements for someone to be with you for the first 24 hours of your Same Day Discharge.    Please read over the following fact sheets that you were given:    __x__ Take these medicines the morning of surgery with A SIP OF WATER:    1. famotidine (PEPCID) 20 MG tablet  2. You may take any of your as needed pain or allergy medication  3.   4.  5.  6.DO NOT TAKE LOSARTAN OR HYDROCHLOROTHIAZIDE THE MORNING OF SURGERY  ____ Fleet Enema (as directed)   __X__ SHOWER WITH YOUR REGULAR SOAP AND SHAMPOO THE NIGHT BEFORE AND MORNING OF SURGERY  ____ Use Benzoyl Peroxide Gel as instructed  ____ Use inhalers on the day of surgery  ___x_ Stop metformin 2 days prior to surgery      Last dose on 10/5    ____ Take 1/2 of usual insulin dose the night before surgery. No insulin the morning          of surgery.   _x___ Stop Plavix last dose on 10/2            Continue aspirin.  Don't take the morning of surgery  _x___ Stop Anti-inflammatories. No ibuprofen or aleve after 10/1   _x___ Stop supplements Ascorbic Acid (VITAMIN C) 1000 MG tablet,Coenzyme Q10 200 MG capsule, JOINT SUPPORT PO ,Omega-3 Fatty Acids (FISH OIL CONCENTRATE) 1000 MG CAPS on 10/1   ____ Bring C-Pap to the hospital.    If you have any questions regarding your  pre-procedure instructions,  Please call Pre-admit Testing at [place]

## 2020-04-28 ENCOUNTER — Other Ambulatory Visit: Payer: Self-pay | Admitting: Cardiovascular Disease

## 2020-04-28 ENCOUNTER — Other Ambulatory Visit: Payer: Self-pay | Admitting: Family Medicine

## 2020-04-28 NOTE — Telephone Encounter (Signed)
Please advise if ok to refill Pepcid.

## 2020-04-28 NOTE — Telephone Encounter (Signed)
Refilled with message for patient to keep upcoming appointment for further refills.

## 2020-04-28 NOTE — Telephone Encounter (Signed)
Please advise if OK to refill. Patient is also taking Pepcid twice daily. Thank you!

## 2020-04-29 ENCOUNTER — Other Ambulatory Visit: Payer: BC Managed Care – PPO

## 2020-04-30 ENCOUNTER — Telehealth: Payer: Self-pay | Admitting: Cardiovascular Disease

## 2020-04-30 ENCOUNTER — Other Ambulatory Visit: Payer: Self-pay

## 2020-04-30 ENCOUNTER — Other Ambulatory Visit
Admission: RE | Admit: 2020-04-30 | Discharge: 2020-04-30 | Disposition: A | Discharge status: Home / Self Care | Payer: BC Managed Care – PPO | Source: Ambulatory Visit | Attending: General Surgery | Admitting: General Surgery

## 2020-04-30 DIAGNOSIS — Z20822 Contact with and (suspected) exposure to covid-19: Secondary | ICD-10-CM | POA: Insufficient documentation

## 2020-04-30 DIAGNOSIS — Z01812 Encounter for preprocedural laboratory examination: Secondary | ICD-10-CM | POA: Diagnosis not present

## 2020-04-30 LAB — SARS CORONAVIRUS 2 (TAT 6-24 HRS): SARS Coronavirus 2: NEGATIVE

## 2020-04-30 NOTE — Telephone Encounter (Signed)
*  STAT* If patient is at the pharmacy, call can be transferred to refill team.   1. Which medications need to be refilled? (please list name of each medication and dose if known) omeprazole 40 mg daily  2. Which pharmacy/location (including street and city if local pharmacy) is medication to be sent to? [facility] on [address]  3. Do they need a 30 day or 90 day supply? [place]

## 2020-05-01 ENCOUNTER — Ambulatory Visit: Payer: BC Managed Care – PPO | Admitting: Urgent Care

## 2020-05-01 ENCOUNTER — Encounter
Admission: RE | Disposition: A | Discharge status: Home / Self Care | Payer: Self-pay | Source: Home / Self Care | Attending: General Surgery

## 2020-05-01 ENCOUNTER — Encounter: Payer: Self-pay | Admitting: General Surgery

## 2020-05-01 ENCOUNTER — Ambulatory Visit
Admission: RE | Admit: 2020-05-01 | Discharge: 2020-05-01 | Disposition: A | Discharge status: Home / Self Care | Payer: BC Managed Care – PPO | Attending: General Surgery | Admitting: General Surgery

## 2020-05-01 DIAGNOSIS — A630 Anogenital (venereal) warts: Secondary | ICD-10-CM | POA: Insufficient documentation

## 2020-05-01 DIAGNOSIS — E119 Type 2 diabetes mellitus without complications: Secondary | ICD-10-CM | POA: Diagnosis not present

## 2020-05-01 DIAGNOSIS — Z7982 Long term (current) use of aspirin: Secondary | ICD-10-CM | POA: Insufficient documentation

## 2020-05-01 DIAGNOSIS — I10 Essential (primary) hypertension: Secondary | ICD-10-CM | POA: Diagnosis not present

## 2020-05-01 DIAGNOSIS — Z87891 Personal history of nicotine dependence: Secondary | ICD-10-CM | POA: Insufficient documentation

## 2020-05-01 DIAGNOSIS — E785 Hyperlipidemia, unspecified: Secondary | ICD-10-CM | POA: Diagnosis not present

## 2020-05-01 DIAGNOSIS — Z79899 Other long term (current) drug therapy: Secondary | ICD-10-CM | POA: Insufficient documentation

## 2020-05-01 DIAGNOSIS — I2510 Atherosclerotic heart disease of native coronary artery without angina pectoris: Secondary | ICD-10-CM | POA: Insufficient documentation

## 2020-05-01 DIAGNOSIS — M1990 Unspecified osteoarthritis, unspecified site: Secondary | ICD-10-CM | POA: Diagnosis not present

## 2020-05-01 DIAGNOSIS — D045 Carcinoma in situ of skin of trunk: Secondary | ICD-10-CM | POA: Diagnosis not present

## 2020-05-01 DIAGNOSIS — K219 Gastro-esophageal reflux disease without esophagitis: Secondary | ICD-10-CM | POA: Diagnosis not present

## 2020-05-01 DIAGNOSIS — Z7902 Long term (current) use of antithrombotics/antiplatelets: Secondary | ICD-10-CM | POA: Diagnosis not present

## 2020-05-01 DIAGNOSIS — Z7984 Long term (current) use of oral hypoglycemic drugs: Secondary | ICD-10-CM | POA: Diagnosis not present

## 2020-05-01 DIAGNOSIS — C44529 Squamous cell carcinoma of skin of other part of trunk: Secondary | ICD-10-CM | POA: Diagnosis not present

## 2020-05-01 HISTORY — PX: MASS EXCISION: SHX2000

## 2020-05-01 LAB — GLUCOSE, CAPILLARY
Glucose-Capillary: 187 mg/dL — ABNORMAL HIGH (ref 70–99)
Glucose-Capillary: 136 mg/dL — ABNORMAL HIGH (ref 70–99)

## 2020-05-01 SURGERY — EXCISION MASS
Anesthesia: Monitor Anesthesia Care | Site: Anus

## 2020-05-01 MED ORDER — FENTANYL CITRATE (PF) 100 MCG/2ML IJ SOLN
INTRAMUSCULAR | Status: AC
  Filled 2020-05-01: qty 2

## 2020-05-01 MED ORDER — BUPIVACAINE HCL (PF) 0.25 % IJ SOLN
INTRAMUSCULAR | Status: DC | PRN
  Administered 2020-05-01: 30 mL

## 2020-05-01 MED ORDER — SODIUM CHLORIDE 0.9 % IV SOLN: INTRAVENOUS | Status: DC

## 2020-05-01 MED ORDER — MIDAZOLAM HCL 2 MG/2ML IJ SOLN
INTRAMUSCULAR | Status: AC
  Filled 2020-05-01: qty 2

## 2020-05-01 MED ORDER — BUPIVACAINE HCL (PF) 0.25 % IJ SOLN
INTRAMUSCULAR | Status: AC
  Filled 2020-05-01: qty 30

## 2020-05-01 MED ORDER — ONDANSETRON HCL 4 MG/2ML IJ SOLN
INTRAMUSCULAR | Status: DC | PRN
  Administered 2020-05-01: 4 mg via INTRAVENOUS

## 2020-05-01 MED ORDER — PROPOFOL 500 MG/50ML IV EMUL
INTRAVENOUS | Status: AC
  Filled 2020-05-01: qty 50

## 2020-05-01 MED ORDER — MIDAZOLAM HCL 2 MG/2ML IJ SOLN
INTRAMUSCULAR | Status: DC | PRN
  Administered 2020-05-01: 2 mg via INTRAVENOUS

## 2020-05-01 MED ORDER — CHLORHEXIDINE GLUCONATE 0.12 % MT SOLN
OROMUCOSAL | Status: AC
  Administered 2020-05-01: 15 mL via OROMUCOSAL
  Filled 2020-05-01: qty 15

## 2020-05-01 MED ORDER — FENTANYL CITRATE (PF) 100 MCG/2ML IJ SOLN
INTRAMUSCULAR | Status: DC | PRN
Start: 2020-05-01 — End: 2020-05-01
  Administered 2020-05-01: 50 ug via INTRAVENOUS

## 2020-05-01 MED ORDER — HYDROCODONE-ACETAMINOPHEN 5-325 MG PO TABS: 1.0000 | ORAL_TABLET | ORAL | 0 refills | Status: AC | PRN

## 2020-05-01 MED ORDER — ORAL CARE MOUTH RINSE: 15.0000 mL | Freq: Once | OROMUCOSAL | Status: AC

## 2020-05-01 MED ORDER — HYDROCODONE-ACETAMINOPHEN 5-325 MG PO TABS
1.0000 | ORAL_TABLET | ORAL | Status: DC | PRN
  Administered 2020-05-01: 1 via ORAL

## 2020-05-01 MED ORDER — HYDROCODONE-ACETAMINOPHEN 5-325 MG PO TABS
ORAL_TABLET | ORAL | Status: AC
  Filled 2020-05-01: qty 1

## 2020-05-01 MED ORDER — CLOPIDOGREL BISULFATE 75 MG PO TABS
75.0000 mg | ORAL_TABLET | Freq: Every day | ORAL | 4 refills | Status: DC
Start: 2020-05-06 — End: 2020-07-20

## 2020-05-01 MED ORDER — CHLORHEXIDINE GLUCONATE 0.12 % MT SOLN: 15.0000 mL | Freq: Once | OROMUCOSAL | Status: AC

## 2020-05-01 MED ORDER — PROPOFOL 500 MG/50ML IV EMUL
INTRAVENOUS | Status: DC | PRN
  Administered 2020-05-01: 35 ug/kg/min via INTRAVENOUS

## 2020-05-01 MED ORDER — BUPIVACAINE LIPOSOME 1.3 % IJ SUSP
INTRAMUSCULAR | Status: AC
  Filled 2020-05-01: qty 20

## 2020-05-01 MED ORDER — BUPIVACAINE LIPOSOME 1.3 % IJ SUSP
INTRAMUSCULAR | Status: DC | PRN
  Administered 2020-05-01: 20 mL

## 2020-05-01 SURGICAL SUPPLY — 35 items
ADH SKN CLS APL DERMABOND .7 (GAUZE/BANDAGES/DRESSINGS) ×1
BLADE SURG 15 STRL SS SAFETY (BLADE) ×2 IMPLANT
BRIEF STRETCH MATERNITY 2XLG (MISCELLANEOUS) ×2 IMPLANT
CANISTER SUCT 1200ML W/VALVE (MISCELLANEOUS) ×2 IMPLANT
COVER WAND RF STERILE (DRAPES) ×2 IMPLANT
DERMABOND ADVANCED (GAUZE/BANDAGES/DRESSINGS) ×1
DERMABOND ADVANCED .7 DNX12 (GAUZE/BANDAGES/DRESSINGS) ×1 IMPLANT
DRAPE LAPAROTOMY 100X77 ABD (DRAPES) ×2 IMPLANT
DRAPE LEGGINS SURG 28X43 STRL (DRAPES) ×2 IMPLANT
DRAPE UNDER BUTTOCK W/FLU (DRAPES) ×2 IMPLANT
DRSG GAUZE PETRO 6X36 STRIP ST (GAUZE/BANDAGES/DRESSINGS) ×2 IMPLANT
ELECT REM PT RETURN 9FT ADLT (ELECTROSURGICAL) ×2
ELECTRODE REM PT RTRN 9FT ADLT (ELECTROSURGICAL) ×1 IMPLANT
GLOVE BIO SURGEON STRL SZ7.5 (GLOVE) ×2 IMPLANT
GLOVE INDICATOR 8.0 STRL GRN (GLOVE) ×2 IMPLANT
GOWN STRL REUS W/ TWL LRG LVL3 (GOWN DISPOSABLE) ×2 IMPLANT
GOWN STRL REUS W/TWL LRG LVL3 (GOWN DISPOSABLE) ×4
KIT TURNOVER CYSTO (KITS) ×2 IMPLANT
LABEL OR SOLS (LABEL) ×2 IMPLANT
NEEDLE HYPO 22GX1.5 SAFETY (NEEDLE) ×2 IMPLANT
NEEDLE HYPO 25X1 1.5 SAFETY (NEEDLE) ×2 IMPLANT
PACK BASIN MINOR (MISCELLANEOUS) ×2 IMPLANT
PAD OB MATERNITY 4.3X12.25 (PERSONAL CARE ITEMS) ×2 IMPLANT
PAD PREP 24X41 OB/GYN DISP (PERSONAL CARE ITEMS) ×2 IMPLANT
SHEARS FOC LG CVD HARMONIC 17C (MISCELLANEOUS) IMPLANT
SURGILUBE 2OZ TUBE FLIPTOP (MISCELLANEOUS) ×2 IMPLANT
SUT CHROMIC 3 0 SH 27 (SUTURE) ×2 IMPLANT
SUT ETHILON 3-0 FS-10 30 BLK (SUTURE) ×2
SUT MNCRL AB 4-0 PS2 18 (SUTURE) ×2 IMPLANT
SUT SILK 0 CT 1 30 (SUTURE) ×2 IMPLANT
SUT VIC AB 3-0 SH 27 (SUTURE)
SUT VIC AB 3-0 SH 27X BRD (SUTURE) IMPLANT
SUTURE EHLN 3-0 FS-10 30 BLK (SUTURE) ×1 IMPLANT
SYR 10ML LL (SYRINGE) ×4 IMPLANT
TAPE TRANSPORE STRL 2 31045 (GAUZE/BANDAGES/DRESSINGS) ×2 IMPLANT

## 2020-05-01 NOTE — OR Nursing (Signed)
Pt questioned the plavix resumption date, discussed with Dr. Bary Castilla via secure chat, states not to resume until the date on discharge instructions to minimize bleeding at op site. Notified patient of same.  Per Dr. Bary Castilla, he doesn't need to see patient in postop prior to discharge.

## 2020-05-01 NOTE — Anesthesia Postprocedure Evaluation (Signed)
Anesthesia Post Note  Patient: Eric [name]  Procedure(s) Performed: EXCISION MASS (N/A Anus)  Patient location during evaluation: PACU Anesthesia Type: MAC Level of consciousness: awake and alert Pain management: pain level controlled Vital Signs Assessment: post-procedure vital signs reviewed and stable Respiratory status: spontaneous breathing, nonlabored ventilation, respiratory function stable and patient connected to nasal cannula oxygen Cardiovascular status: stable and blood pressure returned to baseline Postop Assessment: no apparent nausea or vomiting Anesthetic complications: no   No complications documented.   Last Vitals:  Vitals:   05/01/20 1141 05/01/20 1216  BP: (!) 151/84 (!) 150/80  Pulse: 72 77  Resp: 18 16  Temp: (!) 36.1 C   SpO2: 100% 96%    Last Pain:  Vitals:   05/01/20 1231  TempSrc:   PainSc: 0-No pain                 Arita Miss

## 2020-05-01 NOTE — Discharge Instructions (Addendum)
Bupivacaine Liposomal Suspension for Injection °What is this medicine? °BUPIVACAINE LIPOSOMAL (bue PIV a kane LIP oh som al) is an anesthetic. It causes loss of feeling in the skin or other tissues. It is used to prevent and to treat pain from some procedures. °This medicine may be used for other purposes; ask your health care provider or pharmacist if you have questions. °COMMON BRAND NAME(S): EXPAREL °What should I tell my health care provider before I take this medicine? °They need to know if you have any of these conditions: °· G6PD deficiency °· heart disease °· kidney disease °· liver disease °· low blood pressure °· lung or breathing disease, like asthma °· an unusual or allergic reaction to bupivacaine, other medicines, foods, dyes, or preservatives °· pregnant or trying to get pregnant °· breast-feeding °How should I use this medicine? °This medicine is for injection into the affected area. It is given by a health care professional in a hospital or clinic setting. °Talk to your pediatrician regarding the use of this medicine in children. Special care may be needed. °Overdosage: If you think you have taken too much of this medicine contact a poison control center or emergency room at once. °NOTE: This medicine is only for you. Do not share this medicine with others. °What if I miss a dose? °This does not apply. °What may interact with this medicine? °This medicine may interact with the following medications: °· acetaminophen °· certain antibiotics like dapsone, nitrofurantoin, aminosalicylic acid, sulfonamides °· certain medicines for seizures like phenobarbital, phenytoin, valproic acid °· chloroquine °· cyclophosphamide °· flutamide °· hydroxyurea °· ifosfamide °· metoclopramide °· nitric oxide °· nitroglycerin °· nitroprusside °· nitrous oxide °· other local anesthetics like lidocaine, pramoxine, tetracaine °· primaquine °· quinine °· rasburicase °· sulfasalazine °This list may not describe all possible  interactions. Give your health care provider a list of all the medicines, herbs, non-prescription drugs, or dietary supplements you use. Also tell them if you smoke, drink alcohol, or use illegal drugs. Some items may interact with your medicine. °What should I watch for while using this medicine? °Your condition will be monitored carefully while you are receiving this medicine. °Be careful to avoid injury while the area is numb, and you are not aware of pain. °What side effects may I notice from receiving this medicine? °Side effects that you should report to your doctor or health care professional as soon as possible: °· allergic reactions like skin rash, itching or hives, swelling of the face, lips, or tongue °· seizures °· signs and symptoms of a dangerous change in heartbeat or heart rhythm like chest pain; dizziness; fast, irregular heartbeat; palpitations; feeling faint or lightheaded; falls; breathing problems °· signs and symptoms of methemoglobinemia such as pale, gray, or blue colored skin; headache; fast heartbeat; shortness of breath; feeling faint or lightheaded, falls; tiredness °Side effects that usually do not require medical attention (report to your doctor or health care professional if they continue or are bothersome): °· anxious °· back pain °· changes in taste °· changes in vision °· constipation °· dizziness °· fever °· nausea, vomiting °This list may not describe all possible side effects. Call your doctor for medical advice about side effects. You may report side effects to FDA at 1-800-FDA-1088. °Where should I keep my medicine? °This drug is given in a hospital or clinic and will not be stored at home. °NOTE: This sheet is a summary. It may not cover all possible information. If you have questions about this   medicine, talk to your doctor, pharmacist, or health care provider. °© 2020 [employer] (2019-04-23 10:48:23) ° ° °AMBULATORY SURGERY  °DISCHARGE INSTRUCTIONS ° ° °1) The  drugs that you were given will stay in your system until tomorrow so for the next 24 hours you should not: ° °A) Drive an automobile °B) Make any legal decisions °C) Drink any alcoholic beverage ° ° °2) You may resume regular meals tomorrow.  Today it is better to start with liquids and gradually work up to solid foods. ° °You may eat anything you prefer, but it is better to start with liquids, then soup and crackers, and gradually work up to solid foods. ° ° °3) Please notify your doctor immediately if you have any unusual bleeding, trouble breathing, redness and pain at the surgery site, drainage, fever, or pain not relieved by medication. ° ° ° °4) Additional Instructions: ° ° ° ° ° ° ° °Please contact your physician with any problems or Same Day Surgery at [phone], Monday through Friday 6 am to 4 pm, or [facility] [place] Main number at [phone]. °

## 2020-05-01 NOTE — OR Nursing (Signed)
Dr. Bary Castilla in to see pt in postop 1224 pm.

## 2020-05-01 NOTE — Anesthesia Preprocedure Evaluation (Addendum)
Anesthesia Evaluation  Patient identified by MRN, date of birth, ID band Patient awake    Reviewed: Allergy & Precautions, NPO status , Patient's Chart, lab work & pertinent test results  History of Anesthesia Complications Negative for: history of anesthetic complications  Airway Mallampati: II  TM Distance: >3 FB Neck ROM: Full    Dental no notable dental hx. (+) Teeth Intact   Pulmonary neg pulmonary ROS, neg sleep apnea, neg COPD, Patient abstained from smoking.Not current smoker, former smoker,    Pulmonary exam normal breath sounds clear to auscultation       Cardiovascular Exercise Tolerance: Good METShypertension, + CAD and + Cardiac Stents  (-) Past MI (-) dysrhythmias  Rhythm:Regular Rate:Normal - Systolic murmurs Exercise stress test 2019: Normal exercise treadmill study Target heart rate achieved    Neuro/Psych negative neurological ROS  negative psych ROS   GI/Hepatic GERD  Medicated,(+)     (-) substance abuse  ,   Endo/Other  diabetes  Renal/GU negative Renal ROS     Musculoskeletal   Abdominal   Peds  Hematology   Anesthesia Other Findings Past Medical History: No date: Anginal pain ([place]) No date: Arthritis 2008: Coronary artery disease     Comment:  2 stents  No date: Diabetes type 2, controlled ([place]) 2009, 2012, 2015: Diverticulitis     Comment:  recurrent episodes No date: GERD (gastroesophageal reflux disease) 09/2013: Glaucoma suspect     Comment:  Dr. Wallace Going No date: History of chicken pox 06/2015: History of pneumonia No date: Hyperlipidemia No date: Hypertension 01/20/2020: Squamous cell carcinoma of skin     Comment:  Superior perianal area. 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  Reproductive/Obstetrics                           Anesthesia Physical Anesthesia Plan  ASA: III  Anesthesia Plan: MAC   Post-op Pain  Management:    Induction: Intravenous  PONV Risk Score and Plan: 1 and Ondansetron, Propofol infusion and TIVA  Airway Management Planned: Nasal Cannula  Additional Equipment: None  Intra-op Plan:   Post-operative Plan:   Informed Consent: I have reviewed the patients History and Physical, chart, labs and discussed the procedure including the risks, benefits and alternatives for the proposed anesthesia with the patient or authorized representative who has indicated his/her understanding and acceptance.     Dental advisory given  Plan Discussed with: CRNA and Surgeon  Anesthesia Plan Comments: (Discussed risks of anesthesia with patient, including possibility of difficulty with spontaneous ventilation under anesthesia necessitating airway intervention, PONV, and rare risks such as cardiac or respiratory or neurological events. Patient understands.)        Anesthesia Quick Evaluation

## 2020-05-01 NOTE — Transfer of Care (Signed)
Immediate Anesthesia Transfer of Care Note  Patient: Eric [name]  Procedure(s) Performed: EXCISION MASS (N/A Anus)  Patient Location: PACU  Anesthesia Type:MAC  Level of Consciousness: awake, alert  and oriented  Airway & Oxygen Therapy: Patient Spontanous Breathing  Post-op Assessment: Report given to RN and Post -op Vital signs reviewed and stable  Post vital signs: Reviewed and stable  Last Vitals:  Vitals Value Taken Time  BP 143/109 05/01/20 1113  Temp    Pulse 77 05/01/20 1115  Resp 25 05/01/20 1115  SpO2 95 % 05/01/20 1115  Vitals shown include unvalidated device data.  Last Pain:  Vitals:   05/01/20 0933  TempSrc: Oral  PainSc: 0-No pain         Complications: No complications documented.

## 2020-05-01 NOTE — Op Note (Signed)
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  Postoperative diagnosis: Same.  Operative procedure: Wide excision of in situ carcinoma.  Anoscopy.  Operating surgeon: Hervey Ard, MD.  Anesthesia: Attended local, 20 cc Exparel, 30 cc 0.25% Marcaine, plain.  Estimated blood loss: Less than 5 cc.  Clinical note: This 64 year old male has had a longstanding area of condyloma treated by dermatology.  He has been found on recent biopsy of in situ squamous cell carcinoma.  He is felt to be a candidate for operative excision.  Operative note: Patient underwent sedation after being placed in the prone position and comfortably padded.  Buttocks were taped apart.  Anoscopy was undertaken with no evidence of extension of the condyloma into the anal canal.  No other anal rectal pathology noted.  The area was then cleansed with Betadine solution and draped.  The above-mentioned local anesthetic was infiltrated for postoperative analgesia.  Through an elliptical incision with 5 mm margins the area of condyloma and previous biopsy site was excised.  The skin was incised sharply and remaining dissection completed with electrocautery.  This was extended down into the adipose tissue.  The specimen orientated with a short suture anteriorly and a long suture posteriorly.  The wound was closed with 2 layers of 3-0 Vicryl suture.  The skin was then approximated with a running 4-0 Monocryl suture.  Dermabond was applied to the skin.  Patient was returned to the supine position and taken to the recovery room in stable condition.

## 2020-05-01 NOTE — Telephone Encounter (Signed)
Medication has already been sent to requested pharmacy.
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
## 2020-05-02 ENCOUNTER — Encounter: Payer: Self-pay | Admitting: General Surgery

## 2020-05-02 ENCOUNTER — Other Ambulatory Visit: Payer: Self-pay | Admitting: Cardiovascular Disease

## 2020-05-02 MED ORDER — OMEPRAZOLE 40 MG PO CPDR
40.0000 mg | DELAYED_RELEASE_CAPSULE | Freq: Every evening | ORAL | 3 refills | Status: DC
Start: 2020-05-02 — End: 2020-06-02

## 2020-05-04 LAB — SURGICAL PATHOLOGY

## 2020-05-05 ENCOUNTER — Encounter: Payer: Self-pay | Admitting: General Surgery

## 2020-05-06 NOTE — Op Note (Signed)
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  Postoperative diagnosis: Same.  Operative procedure: Wide excision of in situ carcinoma.  Anoscopy.  Operating surgeon: Hervey Ard, MD.  Anesthesia: Attended local, 20 cc Exparel, 30 cc 0.25% Marcaine, plain.  Estimated blood loss: Less than 5 cc.  Clinical note: This 64 year old male has had a longstanding area of condyloma treated by dermatology.  He has been found on recent biopsy of in situ squamous cell carcinoma.  He is felt to be a candidate for operative excision.  Operative note: Patient underwent sedation after being placed in the prone position and comfortably padded.  Buttocks were taped apart.  Anoscopy was undertaken with no evidence of extension of the condyloma into the anal canal.  No other anal rectal pathology noted.  The area was then cleansed with Betadine solution and draped.  The above-mentioned local anesthetic was infiltrated for postoperative analgesia.  Through an elliptical incision measuring 2 x 5 cm (with 5 mm margins the area of condyloma and previous biopsy site) the area was excised.  The skin was incised sharply and remaining dissection completed with electrocautery.  This was extended down into the adipose tissue.  The specimen orientated with a short suture anteriorly and a long suture posteriorly.  The wound was closed with 2 layers of 3-0 Vicryl suture.  The skin was then approximated with a running 4-0 Monocryl suture.  Dermabond was applied to the skin.  Patient was returned to the supine position and taken to the recovery room in stable condition.

## 2020-05-10 ENCOUNTER — Other Ambulatory Visit: Payer: Self-pay | Admitting: Family Medicine

## 2020-05-10 DIAGNOSIS — E785 Hyperlipidemia, unspecified: Secondary | ICD-10-CM

## 2020-05-10 DIAGNOSIS — E118 Type 2 diabetes mellitus with unspecified complications: Secondary | ICD-10-CM

## 2020-05-10 DIAGNOSIS — E1169 Type 2 diabetes mellitus with other specified complication: Secondary | ICD-10-CM

## 2020-05-10 DIAGNOSIS — Z125 Encounter for screening for malignant neoplasm of prostate: Secondary | ICD-10-CM

## 2020-05-12 ENCOUNTER — Other Ambulatory Visit: Payer: Self-pay

## 2020-05-12 ENCOUNTER — Other Ambulatory Visit (INDEPENDENT_AMBULATORY_CARE_PROVIDER_SITE_OTHER): Payer: BC Managed Care – PPO

## 2020-05-12 DIAGNOSIS — E1169 Type 2 diabetes mellitus with other specified complication: Secondary | ICD-10-CM

## 2020-05-12 DIAGNOSIS — Z125 Encounter for screening for malignant neoplasm of prostate: Secondary | ICD-10-CM

## 2020-05-12 DIAGNOSIS — E118 Type 2 diabetes mellitus with unspecified complications: Secondary | ICD-10-CM | POA: Diagnosis not present

## 2020-05-12 DIAGNOSIS — E785 Hyperlipidemia, unspecified: Secondary | ICD-10-CM | POA: Diagnosis not present

## 2020-05-12 LAB — COMPREHENSIVE METABOLIC PANEL
ALT: 47 U/L (ref 0–53)
AST: 32 U/L (ref 0–37)
Albumin: 4.5 g/dL (ref 3.5–5.2)
Alkaline Phosphatase: 47 U/L (ref 39–117)
BUN: 11 mg/dL (ref 6–23)
CO2: 29 mEq/L (ref 19–32)
Calcium: 9.6 mg/dL (ref 8.4–10.5)
Chloride: 100 mEq/L (ref 96–112)
Creatinine, Ser: 0.86 mg/dL (ref 0.40–1.50)
GFR: 92.31 mL/min (ref >60.00)
Glucose, Bld: 118 mg/dL — ABNORMAL HIGH (ref 70–99)
Potassium: 4.4 mEq/L (ref 3.5–5.1)
Sodium: 138 mEq/L (ref 135–145)
Total Bilirubin: 0.6 mg/dL (ref 0.2–1.2)
Total Protein: 7.4 g/dL (ref 6.0–8.3)

## 2020-05-12 LAB — LIPID PANEL
Cholesterol: 120 mg/dL (ref 0–200)
HDL: 39.4 mg/dL (ref >39.00)
LDL Cholesterol: 55 mg/dL (ref 0–99)
NonHDL: 80.31
Total CHOL/HDL Ratio: 3
Triglycerides: 126 mg/dL (ref 0.0–149.0)
VLDL: 25.2 mg/dL (ref 0.0–40.0)

## 2020-05-12 LAB — PSA: PSA: 0.79 ng/mL (ref 0.10–4.00)

## 2020-05-14 ENCOUNTER — Other Ambulatory Visit: Payer: Self-pay | Admitting: Cardiovascular Disease

## 2020-05-15 LAB — FRUCTOSAMINE: Fructosamine: 271 umol/L (ref 205–285)

## 2020-05-19 ENCOUNTER — Ambulatory Visit (INDEPENDENT_AMBULATORY_CARE_PROVIDER_SITE_OTHER): Payer: BC Managed Care – PPO | Admitting: Family Medicine

## 2020-05-19 ENCOUNTER — Other Ambulatory Visit: Payer: Self-pay

## 2020-05-19 ENCOUNTER — Encounter: Payer: Self-pay | Admitting: Family Medicine

## 2020-05-19 VITALS — BP 136/78 | HR 78 | Temp 98.1°F | Ht 68.25 in | Wt 247.3 lb

## 2020-05-19 DIAGNOSIS — E1169 Type 2 diabetes mellitus with other specified complication: Secondary | ICD-10-CM | POA: Diagnosis not present

## 2020-05-19 DIAGNOSIS — Z0000 Encounter for general adult medical examination without abnormal findings: Secondary | ICD-10-CM | POA: Diagnosis not present

## 2020-05-19 DIAGNOSIS — Z23 Encounter for immunization: Secondary | ICD-10-CM | POA: Diagnosis not present

## 2020-05-19 DIAGNOSIS — E118 Type 2 diabetes mellitus with unspecified complications: Secondary | ICD-10-CM

## 2020-05-19 DIAGNOSIS — E785 Hyperlipidemia, unspecified: Secondary | ICD-10-CM

## 2020-05-19 DIAGNOSIS — T148XXA Other injury of unspecified body region, initial encounter: Secondary | ICD-10-CM

## 2020-05-19 DIAGNOSIS — I2510 Atherosclerotic heart disease of native coronary artery without angina pectoris: Secondary | ICD-10-CM

## 2020-05-19 DIAGNOSIS — I10 Essential (primary) hypertension: Secondary | ICD-10-CM | POA: Diagnosis not present

## 2020-05-19 NOTE — Patient Instructions (Addendum)
Flu shot today  We will ask lung cancer screening program to touch base with you to see if you're eligible for screening.  You are doing well today Continue daily dressing changes Return as needed or in 6 months for follow up visit.  Health Maintenance, Male Adopting a healthy lifestyle and getting preventive care are important in promoting health and wellness. Ask your health care provider about:  The right schedule for you to have regular tests and exams.  Things you can do on your own to prevent diseases and keep yourself healthy. What should I know about diet, weight, and exercise? Eat a healthy diet   Eat a diet that includes plenty of vegetables, fruits, low-fat dairy products, and lean protein.  Do not eat a lot of foods that are high in solid fats, added sugars, or sodium. Maintain a healthy weight Body mass index (BMI) is a measurement that can be used to identify possible weight problems. It estimates body fat based on height and weight. Your health care provider can help determine your BMI and help you achieve or maintain a healthy weight. Get regular exercise Get regular exercise. This is one of the most important things you can do for your health. Most adults should:  Exercise for at least 150 minutes each week. The exercise should increase your heart rate and make you sweat (moderate-intensity exercise).  Do strengthening exercises at least twice a week. This is in addition to the moderate-intensity exercise.  Spend less time sitting. Even light physical activity can be beneficial. Watch cholesterol and blood lipids Have your blood tested for lipids and cholesterol at 63 years of age, then have this test every 5 years. You may need to have your cholesterol levels checked more often if:  Your lipid or cholesterol levels are high.  You are older than 63 years of age.  You are at high risk for heart disease. What should I know about cancer screening? Many types of  cancers can be detected early and may often be prevented. Depending on your health history and family history, you may need to have cancer screening at various ages. This may include screening for:  Colorectal cancer.  Prostate cancer.  Skin cancer.  Lung cancer. What should I know about heart disease, diabetes, and high blood pressure? Blood pressure and heart disease  High blood pressure causes heart disease and increases the risk of stroke. This is more likely to develop in people who have high blood pressure readings, are of African descent, or are overweight.  Talk with your health care provider about your target blood pressure readings.  Have your blood pressure checked: ? Every 3-5 years if you are 7-30 years of age. ? Every year if you are 62 years old or older.  If you are between the ages of 55 and 46 and are a current or former smoker, ask your health care provider if you should have a one-time screening for abdominal aortic aneurysm (AAA). Diabetes Have regular diabetes screenings. This checks your fasting blood sugar level. Have the screening done:  Once every three years after age 52 if you are at a normal weight and have a low risk for diabetes.  More often and at a younger age if you are overweight or have a high risk for diabetes. What should I know about preventing infection? Hepatitis B If you have a higher risk for hepatitis B, you should be screened for this virus. Talk with your health care provider to  find out if you are at risk for hepatitis B infection. Hepatitis C Blood testing is recommended for:  Everyone born from [birth year] through 1964.  Anyone with known risk factors for hepatitis C. Sexually transmitted infections (STIs)  You should be screened each year for STIs, including gonorrhea and chlamydia, if: ? You are sexually active and are younger than 63 years of age. ? You are older than 63 years of age and your health care provider tells you that you  are at risk for this type of infection. ? Your sexual activity has changed since you were last screened, and you are at increased risk for chlamydia or gonorrhea. Ask your health care provider if you are at risk.  Ask your health care provider about whether you are at high risk for HIV. Your health care provider may recommend a prescription medicine to help prevent HIV infection. If you choose to take medicine to prevent HIV, you should first get tested for HIV. You should then be tested every 3 months for as long as you are taking the medicine. Follow these instructions at home: Lifestyle  Do not use any products that contain nicotine or tobacco, such as cigarettes, e-cigarettes, and chewing tobacco. If you need help quitting, ask your health care provider.  Do not use street drugs.  Do not share needles.  Ask your health care provider for help if you need support or information about quitting drugs. Alcohol use  Do not drink alcohol if your health care provider tells you not to drink.  If you drink alcohol: ? Limit how much you have to 0-2 drinks a day. ? Be aware of how much alcohol is in your drink. In the [place] one drink equals one 12 oz bottle of beer (355 mL), one 5 oz glass of wine (148 mL), or one 1 oz glass of hard liquor (44 mL). General instructions  Schedule regular health, dental, and eye exams.  Stay current with your vaccines.  Tell your health care provider if: ? You often feel depressed. ? You have ever been abused or do not feel safe at home. Summary  Adopting a healthy lifestyle and getting preventive care are important in promoting health and wellness.  Follow your health care provider's instructions about healthy diet, exercising, and getting tested or screened for diseases.  Follow your health care provider's instructions on monitoring your cholesterol and blood pressure. This information is not intended to replace advice given to you by your health care  provider. Make sure you discuss any questions you have with your health care provider. Document Revised: 07/04/2018 Document Reviewed: 07/04/2018 Elsevier Patient Education  2020 [employer].

## 2020-05-19 NOTE — Assessment & Plan Note (Addendum)
Recent re - excision s/p dehiscence planned healing by second intention. Continues daily dressing changes.  Wound today redressed with abx ointment and gauze.

## 2020-05-19 NOTE — Assessment & Plan Note (Addendum)
Chronic, stable on daily metformin.

## 2020-05-19 NOTE — Assessment & Plan Note (Signed)
Preventative protocols reviewed and updated unless pt declined. Discussed healthy diet and lifestyle.  

## 2020-05-19 NOTE — Assessment & Plan Note (Signed)
Chronic, stable. Continue current regimen. 

## 2020-05-19 NOTE — Assessment & Plan Note (Signed)
Reviewed weight gain noted. Appetite has returned. He will renew efforts at healthy diet/lifestyle choices for goal sustainable weight loss.

## 2020-05-19 NOTE — Assessment & Plan Note (Addendum)
Chronic, stable on crestor. Continue current regimen.  The ASCVD Risk score Mikey Bussing DC Jr., et al., 2013) failed to calculate for the following reasons:   The valid total cholesterol range is 130 to 320 mg/dL

## 2020-05-19 NOTE — Assessment & Plan Note (Signed)
Sees cards regularly. Continue aspirin, statin, plavix.

## 2020-05-19 NOTE — Progress Notes (Signed)
This visit was conducted in person.  BP 136/78 (BP Location: Left Arm, Patient Position: Sitting, Cuff Size: Large)   Pulse 78   Temp 98.1 F (36.7 C) (Temporal)   Ht 5' 8.25" (1.734 m)   Wt 247 lb 5 oz (112.2 kg)   SpO2 93%   BMI 37.33 kg/m    CC: CPE Subjective:    Patient ID: Eric [name], male    DOB: [birth year], 64 y.o.   MRN: [id number]  HPI: Eric [name] is a 64 y.o. male presenting on 05/19/2020 for Annual Exam   Recent repeat excisional surgery by Dr Fleet Contras for in situ SCC arising within perianal SK, second excision done with clear margins - with resultant wound dehiscence. Gen surg f/u scheduled next Tuesday. Daily dressing changes with neosporin and gauze.   Preventative: COLONOSCOPY WITH PROPOFOL;03/14/2016;TAx1, diverticulosis, rpt 5 yrs; Eric Sails, MD  Prostate cancer screening - has had normal in past. Lung cancer screening - discussed, interetsed in learning more  Flu shot yearly  Pneumovax 03/10/2014 [facility]) Tetanus 2007, Tdap 2016 zostavax 07/2013  shingrix -completed2019  COVID vaccine Moderna 08/2019, 09/2019  Seat belt use discussed  Sunscreen use discussed, no changing moles on skin Ex smoker- quit 03/2007. Smoked 1ppd for 20 yrs. ~20 PY hx Alcohol - averages5-10 beers/wk.No hangover. Dentist Q6 mo  Eye exam Q6 mo - monitoring glaucoma  Lives with brother  Occupation: truck driver, stocks stores  Activity:started walking Diet: good water, fruits/vegetables some - healthier eating choices     Relevant past medical, surgical, family and social history reviewed and updated as indicated. Interim medical history since our last visit reviewed. Allergies and medications reviewed and updated. Outpatient Medications Prior to Visit  Medication Sig Dispense Refill  . acetaminophen (TYLENOL) 500 MG tablet Take 500-1,000 mg by mouth every 6 (six) hours as needed (pain.).    [name] Ascorbic Acid (VITAMIN C) 1000 MG tablet Take  1,000 mg by mouth every evening.     [name] aspirin EC 81 MG tablet Take 1 tablet (81 mg total) by mouth daily. 90 tablet 3  . cholecalciferol (VITAMIN D3) 25 MCG (1000 UT) tablet Take 1,000 Units by mouth every evening.     . clopidogrel (PLAVIX) 75 MG tablet Take 1 tablet (75 mg total) by mouth daily. 90 tablet 4  . Coenzyme Q10 200 MG capsule Take 200 mg by mouth daily in the afternoon.     . cyclobenzaprine (FLEXERIL) 10 MG tablet Take 1 tablet (10 mg total) by mouth 3 (three) times daily as needed for muscle spasms. 30 tablet 1  . diazepam (VALIUM) 5 MG tablet Take 1 tablet (5 mg total) by mouth every 12 (twelve) hours as needed for anxiety or sedation (sleep). 30 tablet 0  . diphenhydrAMINE (BENADRYL) 25 MG tablet Take 25 mg by mouth every 6 (six) hours as needed for allergies.     [name] EPINEPHRINE 0.3 mg/0.3 mL IJ SOAJ injection INJECT INTRAMUSCULARY AS DIRECTED 4 each 0  . famotidine (PEPCID) 20 MG tablet TAKE 1 TABLET BY MOUTH TWICE DAILY 60 tablet 0  . fexofenadine (ALLEGRA) 180 MG tablet Take 1 tablet (180 mg total) by mouth daily. 90 tablet 4  . hydrochlorothiazide (HYDRODIURIL) 25 MG tablet Hold until your outpatient doctor followup due to your diarrhea and acute kidney injury. (Patient taking differently: Take 25 mg by mouth daily. Hold until your outpatient doctor followup due to your diarrhea and acute kidney injury.) 90 tablet 4  .  HYDROcodone-acetaminophen (NORCO/VICODIN) 5-325 MG tablet Take 1 tablet by mouth every 4 (four) hours as needed for moderate pain. 12 tablet 0  . losartan (COZAAR) 100 MG tablet Hold until your outpatient doctor followup due to your diarrhea and acute kidney injury. (Patient taking differently: Take 100 mg by mouth daily. Hold until your outpatient doctor followup due to your diarrhea and acute kidney injury.) 90 tablet 4  . metFORMIN (GLUCOPHAGE) 500 MG tablet TAKE 3 TABLETS BY MOUTH EVERY MORNING WITH BREAKFAST 270 tablet 0  . Methylcellulose, Laxative,  (CITRUCEL) 500 MG TABS Take 1,000 mg by mouth at bedtime.    . Misc Natural Products (JOINT SUPPORT PO) Take 3 tablets by mouth daily in the afternoon. Sports [employer] Estate manager/land agent    . Multiple Vitamin (MULTIVITAMIN WITH MINERALS) TABS tablet Take 1 tablet by mouth every evening.    . nitroGLYCERIN (NITROSTAT) 0.4 MG SL tablet PLACE 1 TABLET UNDER THE TONGUE IF NEEDED EVERY 5 MINUTES FOR CHEST PAIN FOR 3 DOSES IF NO RELIEF AFER FIRST DOSE CALL PRESCRIBER OR 911 (Patient taking differently: Place 0.4 mg under the tongue every 5 (five) minutes x 3 doses as needed for chest pain. PLACE 1 TABLET UNDER THE TONGUE IF NEEDED EVERY 5 MINUTES FOR CHEST PAIN FOR 3 DOSES IF NO RELIEF AFER FIRST DOSE CALL PRESCRIBER OR 911) 25 tablet 3  . Omega-3 Fatty Acids (FISH OIL CONCENTRATE) 1000 MG CAPS Take 3,000 mg by mouth every evening.     [name] omeprazole (PRILOSEC) 40 MG capsule TAKE 1 CAPSULE(40 MG) BY MOUTH DAILY 90 capsule 3  . rosuvastatin (CRESTOR) 40 MG tablet Take 1 tablet (40 mg total) by mouth daily. (Patient taking differently: Take 40 mg by mouth every evening. ) 90 tablet 4  . sildenafil (REVATIO) 20 MG tablet Take 1 tablet (20 mg total) by mouth 3 (three) times daily as needed. 90 tablet 6  . traMADol (ULTRAM) 50 MG tablet Take 1 tablet (50 mg total) by mouth every 6 (six) hours as needed. (Patient taking differently: Take 50 mg by mouth every 6 (six) hours as needed (PAIN.). ) 20 tablet 1  . traZODone (DESYREL) 50 MG tablet Take 0.5-1 tablets (25-50 mg total) by mouth at bedtime as needed for sleep. 30 tablet 3  . omeprazole (PRILOSEC) 40 MG capsule Take 1 capsule (40 mg total) by mouth every evening. 90 capsule 3   No facility-administered medications prior to visit.     Per HPI unless specifically indicated in ROS section below Review of Systems  Constitutional: Negative for activity change, appetite change, chills, fatigue, fever and unexpected weight change.  HENT: Negative for hearing loss.     Eyes: Negative for visual disturbance.  Respiratory: Negative for cough, chest tightness, shortness of breath and wheezing.   Cardiovascular: Negative for chest pain, palpitations and leg swelling.  Gastrointestinal: Negative for abdominal distention, abdominal pain, blood in stool, constipation, diarrhea, nausea and vomiting.  Genitourinary: Negative for difficulty urinating and hematuria.  Musculoskeletal: Negative for arthralgias, myalgias and neck pain.  Skin: Negative for rash.  Neurological: Negative for dizziness, seizures, syncope and headaches.  Hematological: Negative for adenopathy. Bruises/bleeds easily.  Psychiatric/Behavioral: Negative for dysphoric mood. The patient is not nervous/anxious.    Objective:  BP 136/78 (BP Location: Left Arm, Patient Position: Sitting, Cuff Size: Large)   Pulse 78   Temp 98.1 F (36.7 C) (Temporal)   Ht 5' 8.25" (1.734 m)   Wt 247 lb 5 oz (112.2 kg)   SpO2 93%  BMI 37.33 kg/m   Wt Readings from Last 3 Encounters:  05/19/20 247 lb 5 oz (112.2 kg)  05/01/20 238 lb 1.6 oz (108 kg)  04/21/20 238 lb (108 kg)      Physical Exam Vitals and nursing note reviewed.  Constitutional:      General: He is not in acute distress.    Appearance: Normal appearance. He is well-developed. He is obese. He is not ill-appearing.  HENT:     Head: Normocephalic and atraumatic.     [name] Ear: Hearing, tympanic membrane, ear canal and external ear normal.     Left Ear: Hearing, tympanic membrane, ear canal and external ear normal.  Eyes:     General: No scleral icterus.    Extraocular Movements: Extraocular movements intact.     Conjunctiva/sclera: Conjunctivae normal.     Pupils: Pupils are equal, round, and reactive to light.  Neck:     Thyroid: No thyroid mass or thyromegaly.  Cardiovascular:     Rate and Rhythm: Normal rate and regular rhythm.     Pulses: Normal pulses.          Radial pulses are 2+ on the [name] side and 2+ on the left side.      Heart sounds: Normal heart sounds. No murmur heard.   Pulmonary:     Effort: Pulmonary effort is normal. No respiratory distress.     Breath sounds: Normal breath sounds. No wheezing, rhonchi or rales.  Abdominal:     General: Abdomen is flat. Bowel sounds are normal. There is no distension.     Palpations: Abdomen is soft. There is no mass.     Tenderness: There is no abdominal tenderness. There is no guarding or rebound.     Hernia: No hernia is present.  Musculoskeletal:        General: Normal range of motion.     Cervical back: Normal range of motion and neck supple.     [name] lower leg: No edema.     Left lower leg: No edema.  Lymphadenopathy:     Cervical: No cervical adenopathy.  Skin:    General: Skin is warm and dry.     Findings: Wound present. No rash.          Comments: Open wound midline posterior buttock without erythema or drainage  Neurological:     General: No focal deficit present.     Mental Status: He is alert and oriented to person, place, and time.     Comments: CN grossly intact, station and gait intact  Psychiatric:        Mood and Affect: Mood normal.        Behavior: Behavior normal.        Thought Content: Thought content normal.        Judgment: Judgment normal.       Results for orders placed or performed in visit on 05/12/20  Fructosamine  Result Value Ref Range   Fructosamine 271 205 - 285 umol/L  PSA  Result Value Ref Range   PSA 0.79 0.10 - 4.00 ng/mL  Comprehensive metabolic panel  Result Value Ref Range   Sodium 138 135 - 145 mEq/L   Potassium 4.4 3.5 - 5.1 mEq/L   Chloride 100 96 - 112 mEq/L   CO2 29 19 - 32 mEq/L   Glucose, Bld 118 (H) 70 - 99 mg/dL   BUN 11 6 - 23 mg/dL   Creatinine, Ser 0.86 0.40 - 1.50 mg/dL  Total Bilirubin 0.6 0.2 - 1.2 mg/dL   Alkaline Phosphatase 47 39 - 117 U/L   AST 32 0 - 37 U/L   ALT 47 0 - 53 U/L   Total Protein 7.4 6.0 - 8.3 g/dL   Albumin 4.5 3.5 - 5.2 g/dL   GFR 92.31 >60.00 mL/min    Calcium 9.6 8.4 - 10.5 mg/dL  Lipid panel  Result Value Ref Range   Cholesterol 120 0 - 200 mg/dL   Triglycerides 126.0 0 - 149 mg/dL   HDL 39.40 >39.00 mg/dL   VLDL 25.2 0.0 - 40.0 mg/dL   LDL Cholesterol 55 0 - 99 mg/dL   Total CHOL/HDL Ratio 3    NonHDL 80.31    Assessment & Plan:  This visit occurred during the SARS-CoV-2 public health emergency.  Safety protocols were in place, including screening questions prior to the visit, additional usage of staff PPE, and extensive cleaning of exam room while observing appropriate contact time as indicated for disinfecting solutions.   Problem List Items Addressed This Visit    Severe obesity (BMI 35.0-39.9) with comorbidity ([place])    Reviewed weight gain noted. Appetite has returned. He will renew efforts at healthy diet/lifestyle choices for goal sustainable weight loss.       Open wound of skin    Recent re - excision s/p dehiscence planned healing by second intention. Continues daily dressing changes.  Wound today redressed with abx ointment and gauze.       HYPERTENSION, BENIGN    Chronic, stable. Continue current regimen.       Hyperlipidemia associated with type 2 diabetes mellitus (HCC)    Chronic, stable on crestor. Continue current regimen.  The ASCVD Risk score Mikey Bussing DC Jr., et al., 2013) failed to calculate for the following reasons:   The valid total cholesterol range is 130 to 320 mg/dL       Health maintenance examination - Primary    Preventative protocols reviewed and updated unless pt declined. Discussed healthy diet and lifestyle.       Diabetes mellitus type 2, controlled, with complications ([place])    Chronic, stable on daily metformin.       CAD (coronary artery disease)    Sees cards regularly. Continue aspirin, statin, plavix.        Other Visit Diagnoses    Need for influenza vaccination       Relevant Orders   Flu Vaccine QUAD 36+ mos IM (Completed)       No orders of the defined types were  placed in this encounter.  Orders Placed This Encounter  Procedures  . Flu Vaccine QUAD 36+ mos IM    Patient instructions: Flu shot today  We will ask lung cancer screening program to touch base with you to see if you're eligible for screening.  You are doing well today Continue daily dressing changes Return as needed or in 6 months for follow up visit.  Follow up plan: Return in about 6 months (around 11/17/2020) for follow up visit.  Ria Bush, MD

## 2020-05-29 ENCOUNTER — Other Ambulatory Visit: Payer: Self-pay | Admitting: Orthopedic Surgery

## 2020-05-29 ENCOUNTER — Other Ambulatory Visit (HOSPITAL_COMMUNITY): Payer: Self-pay | Admitting: Orthopedic Surgery

## 2020-05-29 DIAGNOSIS — G8929 Other chronic pain: Secondary | ICD-10-CM

## 2020-05-29 DIAGNOSIS — M25511 Pain in right shoulder: Secondary | ICD-10-CM
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
## 2020-06-02 ENCOUNTER — Ambulatory Visit (INDEPENDENT_AMBULATORY_CARE_PROVIDER_SITE_OTHER): Payer: BC Managed Care – PPO | Admitting: Cardiovascular Disease

## 2020-06-02 ENCOUNTER — Other Ambulatory Visit: Payer: Self-pay

## 2020-06-02 ENCOUNTER — Encounter: Payer: Self-pay | Admitting: Cardiovascular Disease

## 2020-06-02 VITALS — BP 150/90 | HR 83 | Ht 69.0 in | Wt 251.0 lb

## 2020-06-02 DIAGNOSIS — I10 Essential (primary) hypertension: Secondary | ICD-10-CM | POA: Diagnosis not present

## 2020-06-02 DIAGNOSIS — I25118 Atherosclerotic heart disease of native coronary artery with other forms of angina pectoris: Secondary | ICD-10-CM | POA: Diagnosis not present

## 2020-06-02 DIAGNOSIS — E118 Type 2 diabetes mellitus with unspecified complications: Secondary | ICD-10-CM

## 2020-06-02 DIAGNOSIS — E782 Mixed hyperlipidemia: Secondary | ICD-10-CM

## 2020-06-02 MED ORDER — TADALAFIL 20 MG PO TABS: 20.0000 mg | ORAL_TABLET | Freq: Every day | ORAL | 1 refills | Status: DC | PRN

## 2020-06-02 MED ORDER — FAMOTIDINE 20 MG PO TABS
20.0000 mg | ORAL_TABLET | Freq: Two times a day (BID) | ORAL | 3 refills | Status: DC
Start: 2020-06-02 — End: 2021-07-12

## 2020-06-02 MED ORDER — FEXOFENADINE HCL 180 MG PO TABS
180.0000 mg | ORAL_TABLET | Freq: Every day | ORAL | 4 refills | Status: DC
Start: 2020-06-02 — End: 2021-11-01

## 2020-06-02 NOTE — Patient Instructions (Signed)
Medication Instructions:  No changes  If you need a refill on your cardiac medications before your next appointment, please call your pharmacy.    Lab work: No new labs needed   If you have labs (blood work) drawn today and your tests are completely normal, you will receive your results only by: . MyChart Message (if you have MyChart) OR . A paper copy in the mail If you have any lab test that is abnormal or we need to change your treatment, we will call you to review the results.   Testing/Procedures: No new testing needed   Follow-Up: At [facility], you and your health needs are our priority.  As part of our continuing mission to provide you with exceptional heart care, we have created designated Provider Care Teams.  These Care Teams include your primary Cardiologist (physician) and Advanced Practice Providers (APPs -  Physician Assistants and Nurse Practitioners) who all work together to provide you with the care you need, when you need it.  . You will need a follow up appointment in 12 months  . Providers on your designated Care Team:   . Christopher Berge, NP . Ryan Dunn, PA-C . Jacquelyn Visser, PA-C  Any Other Special Instructions Will Be Listed Below (If Applicable).  COVID-19 Vaccine Information can be found at: [email or web address] For questions related to vaccine distribution or appointments, please email [email or web address] or call [phone].     

## 2020-06-13 ENCOUNTER — Ambulatory Visit
Admission: RE | Admit: 2020-06-13 | Discharge: 2020-06-13 | Disposition: A | Discharge status: Home / Self Care | Payer: BC Managed Care – PPO | Source: Ambulatory Visit | Attending: Orthopedic Surgery | Admitting: Orthopedic Surgery

## 2020-06-13 ENCOUNTER — Other Ambulatory Visit: Payer: Self-pay

## 2020-06-13 DIAGNOSIS — S46011A Strain of muscle(s) and tendon(s) of the rotator cuff of right shoulder, initial encounter: Secondary | ICD-10-CM | POA: Diagnosis not present

## 2020-06-13 DIAGNOSIS — M25512 Pain in left shoulder: Secondary | ICD-10-CM | POA: Diagnosis not present

## 2020-06-13 DIAGNOSIS — G8929 Other chronic pain: Secondary | ICD-10-CM

## 2020-06-13 DIAGNOSIS — M25511 Pain in right shoulder: Secondary | ICD-10-CM | POA: Diagnosis not present

## 2020-06-13 DIAGNOSIS — S46012A Strain of muscle(s) and tendon(s) of the rotator cuff of left shoulder, initial encounter: Secondary | ICD-10-CM | POA: Diagnosis not present

## 2020-06-16 DIAGNOSIS — M75111 Incomplete rotator cuff tear or rupture of right shoulder, not specified as traumatic: Secondary | ICD-10-CM | POA: Diagnosis not present

## 2020-06-16 DIAGNOSIS — M75112 Incomplete rotator cuff tear or rupture of left shoulder, not specified as traumatic: Secondary | ICD-10-CM | POA: Diagnosis not present

## 2020-07-14 DIAGNOSIS — C44529 Squamous cell carcinoma of skin of other part of trunk: Secondary | ICD-10-CM | POA: Diagnosis not present

## 2020-07-20 ENCOUNTER — Other Ambulatory Visit: Payer: Self-pay | Admitting: Cardiovascular Disease

## 2020-07-20 ENCOUNTER — Other Ambulatory Visit: Payer: Self-pay | Admitting: Family Medicine

## 2020-07-20 NOTE — Telephone Encounter (Signed)
Rx request sent to pharmacy.  

## 2020-09-29 DIAGNOSIS — C44529 Squamous cell carcinoma of skin of other part of trunk: Secondary | ICD-10-CM | POA: Diagnosis not present

## 2020-09-29 DIAGNOSIS — H40003 Preglaucoma, unspecified, bilateral: Secondary | ICD-10-CM | POA: Diagnosis not present

## 2020-10-20 ENCOUNTER — Other Ambulatory Visit: Payer: Self-pay | Admitting: Cardiovascular Disease

## 2020-10-20 DIAGNOSIS — H40003 Preglaucoma, unspecified, bilateral: Secondary | ICD-10-CM | POA: Diagnosis not present

## 2020-10-20 LAB — HM DIABETES EYE EXAM

## 2020-10-20 NOTE — Telephone Encounter (Signed)
It says this was d/c'd at last visit with Dr. Rockey Situ, but I do not see that documented in his note. Should this be refilled for pt?

## 2020-10-20 NOTE — Telephone Encounter (Signed)
Nitro was d/c 05/2020, I did not see in his notes as to why it was d/c, but it was d/c at that office visit. Since it is not almost April, I would not refill unless otherwise instructed by Dr. Rockey Situ or another provider since we are unsure why it was discontinue.

## 2020-10-30 ENCOUNTER — Other Ambulatory Visit: Payer: Self-pay | Admitting: Cardiovascular Disease

## 2020-10-30 NOTE — Telephone Encounter (Signed)
Please advise if OK to refill. Discontinued from med list. Thank you!

## 2020-11-17 ENCOUNTER — Other Ambulatory Visit: Payer: Self-pay

## 2020-11-17 ENCOUNTER — Ambulatory Visit (INDEPENDENT_AMBULATORY_CARE_PROVIDER_SITE_OTHER): Payer: BC Managed Care – PPO | Admitting: Family Medicine

## 2020-11-17 ENCOUNTER — Encounter: Payer: Self-pay | Admitting: Family Medicine

## 2020-11-17 VITALS — BP 138/72 | HR 84 | Temp 98.0°F | Ht 69.0 in | Wt 248.2 lb

## 2020-11-17 DIAGNOSIS — F4320 Adjustment disorder, unspecified: Secondary | ICD-10-CM | POA: Diagnosis not present

## 2020-11-17 DIAGNOSIS — E118 Type 2 diabetes mellitus with unspecified complications: Secondary | ICD-10-CM | POA: Diagnosis not present

## 2020-11-17 LAB — POCT GLYCOSYLATED HEMOGLOBIN (HGB A1C): Hemoglobin A1C: 6.7 % — AB (ref 4.0–5.6)

## 2020-11-17 MED ORDER — METFORMIN HCL 500 MG PO TABS: 1500.0000 mg | ORAL_TABLET | Freq: Every day | ORAL | 1 refills | Status: DC

## 2020-11-17 MED ORDER — DIAZEPAM 5 MG PO TABS: 5.0000 mg | ORAL_TABLET | Freq: Two times a day (BID) | ORAL | 0 refills | Status: DC | PRN

## 2020-11-17 MED ORDER — TRAZODONE HCL 50 MG PO TABS: 25.0000 mg | ORAL_TABLET | Freq: Every evening | ORAL | 3 refills | Status: DC | PRN

## 2020-11-17 NOTE — Patient Instructions (Addendum)
We will request latest eye exam from Dr Wallace Going with [facility].  You are doing well today Continue current medicines  Return as needed or in 6 months for physical.  Schedule lab visit for A1c if needed.

## 2020-11-17 NOTE — Assessment & Plan Note (Signed)
Encouraged ongoing efforts for sustainable weight loss.

## 2020-11-17 NOTE — Progress Notes (Signed)
Patient ID: Eric [name], male    DOB: [birth year], 65 y.o.   MRN: [id number]  This visit was conducted in person.  BP 138/72   Pulse 84   Temp 98 F (36.7 C) (Temporal)   Ht 5\' 9"  (1.753 m)   Wt 248 lb 3 oz (112.6 kg)   SpO2 93%   BMI 36.65 kg/m    CC: 6 mo f/u visit  Subjective:   HPI: Eric [name] is a 65 y.o. male presenting on 11/17/2020 for Follow-up (Here for 6 mo f/u.)   Planning to retire next week.   Recently restarted low carb diet 1 month ago - with resultant 12 lb weight loss.   DM - does not regularly check sugars. Compliant with antihyperglycemic regimen which includes: metformin 1500mg  daily in am. Denies low sugars or hypoglycemic symptoms. Denies paresthesias. Last diabetic eye exam DUE. Pneumovax: 2015. Prevnar: not due. Glucometer brand: doesn't have. DSME: declined.  Lab Results  Component Value Date   HGBA1C 6.7 (A) 11/17/2020   Diabetic Foot Exam - Simple   Simple Foot Form Diabetic Foot exam was performed with the following findings: Yes 11/17/2020 11:51 AM  Visual Inspection No deformities, no ulcerations, no other skin breakdown bilaterally: Yes Sensation Testing Intact to touch and monofilament testing bilaterally: Yes Pulse Check See comments: Yes Comments R DP, L PT 2+    Lab Results  Component Value Date   MICROALBUR <0.7 03/10/2015       Relevant past medical, surgical, family and social history reviewed and updated as indicated. Interim medical history since our last visit reviewed. Allergies and medications reviewed and updated. Outpatient Medications Prior to Visit  Medication Sig Dispense Refill  . acetaminophen (TYLENOL) 500 MG tablet Take 500-1,000 mg by mouth every 6 (six) hours as needed (pain.).    [name] Ascorbic Acid (VITAMIN C) 1000 MG tablet Take 1,000 mg by mouth every evening.     [name] aspirin EC 81 MG tablet Take 162 mg by mouth daily.  90 tablet 3  . cholecalciferol (VITAMIN D3) 25 MCG (1000 UT) tablet Take  1,000 Units by mouth every evening.     . clopidogrel (PLAVIX) 75 MG tablet TAKE 1 TABLET(75 MG) BY MOUTH DAILY 90 tablet 3  . Coenzyme Q10 200 MG capsule Take 200 mg by mouth daily in the afternoon.     . cyclobenzaprine (FLEXERIL) 10 MG tablet Take 1 tablet (10 mg total) by mouth 3 (three) times daily as needed for muscle spasms. 30 tablet 1  . diphenhydrAMINE (BENADRYL) 25 MG tablet Take 25 mg by mouth every 6 (six) hours as needed for allergies.     [name] EPINEPHRINE 0.3 mg/0.3 mL IJ SOAJ injection INJECT INTRAMUSCULARY AS DIRECTED 4 each 0  . famotidine (PEPCID) 20 MG tablet Take 1 tablet (20 mg total) by mouth 2 (two) times daily. 180 tablet 3  . fexofenadine (ALLEGRA) 180 MG tablet Take 1 tablet (180 mg total) by mouth daily. 90 tablet 4  . hydrochlorothiazide (HYDRODIURIL) 25 MG tablet TAKE 1 TABLET BY MOUTH DAILY 90 tablet 3  . HYDROcodone-acetaminophen (NORCO/VICODIN) 5-325 MG tablet Take 1 tablet by mouth every 4 (four) hours as needed for moderate pain. 12 tablet 0  . losartan (COZAAR) 100 MG tablet TAKE 1 TABLET(100 MG) BY MOUTH DAILY 90 tablet 3  . Methylcellulose, Laxative, (CITRUCEL) 500 MG TABS Take 1,000 mg by mouth at bedtime.    . Misc Natural Products (JOINT SUPPORT PO) Take 3 tablets  by mouth daily in the afternoon. [employer] Estate manager/land agent    . Multiple Vitamin (MULTIVITAMIN WITH MINERALS) TABS tablet Take 1 tablet by mouth every evening.    . nitroGLYCERIN (NITROSTAT) 0.4 MG SL tablet Place 1 tablet (0.4 mg total) under the tongue every 5 (five) minutes x 3 doses as needed for chest pain. PLACE 1 TABLET UNDER THE TONGUE IF NEEDED EVERY 5 MINUTES FOR CHEST PAIN FOR 3 DOSES IF NO RELIEF AFER FIRST DOSE CALL PRESCRIBER OR 911 25 tablet 3  . Omega-3 Fatty Acids (FISH OIL CONCENTRATE) 1000 MG CAPS Take 3,000 mg by mouth every evening.     [name] omeprazole (PRILOSEC) 40 MG capsule TAKE 1 CAPSULE(40 MG) BY MOUTH DAILY 90 capsule 3  . rosuvastatin (CRESTOR) 40 MG tablet TAKE 1 TABLET(40  MG) BY MOUTH DAILY 90 tablet 3  . sildenafil (REVATIO) 20 MG tablet Take 1 tablet (20 mg total) by mouth 3 (three) times daily as needed. 90 tablet 6  . tadalafil (CIALIS) 20 MG tablet Take 1 tablet (20 mg total) by mouth daily as needed for erectile dysfunction. 30 tablet 1  . traMADol (ULTRAM) 50 MG tablet Take 50 mg by mouth every 6 (six) hours as needed.    . diazepam (VALIUM) 5 MG tablet Take 1 tablet (5 mg total) by mouth every 12 (twelve) hours as needed for anxiety or sedation (sleep). 30 tablet 0  . metFORMIN (GLUCOPHAGE) 500 MG tablet TAKE 3 TABLETS BY MOUTH EVERY MORNING WITH BREAKFAST 270 tablet 1  . traZODone (DESYREL) 50 MG tablet Take 0.5-1 tablets (25-50 mg total) by mouth at bedtime as needed for sleep. 30 tablet 3   No facility-administered medications prior to visit.     Per HPI unless specifically indicated in ROS section below Review of Systems Objective:  BP 138/72   Pulse 84   Temp 98 F (36.7 C) (Temporal)   Ht 5\' 9"  (1.753 m)   Wt 248 lb 3 oz (112.6 kg)   SpO2 93%   BMI 36.65 kg/m   Wt Readings from Last 3 Encounters:  11/17/20 248 lb 3 oz (112.6 kg)  06/02/20 251 lb (113.9 kg)  05/19/20 247 lb 5 oz (112.2 kg)      Physical Exam Vitals and nursing note reviewed.  Constitutional:      General: He is not in acute distress.    Appearance: Normal appearance. He is well-developed. He is obese. He is not ill-appearing.  Eyes:     General: No scleral icterus.    Extraocular Movements: Extraocular movements intact.     Conjunctiva/sclera: Conjunctivae normal.     Pupils: Pupils are equal, round, and reactive to light.  Cardiovascular:     Rate and Rhythm: Normal rate and regular rhythm.     Pulses: Normal pulses.     Heart sounds: Normal heart sounds. No murmur heard.   Pulmonary:     Effort: Pulmonary effort is normal. No respiratory distress.     Breath sounds: Normal breath sounds. No wheezing, rhonchi or rales.  Musculoskeletal:     Cervical back:  Normal range of motion and neck supple.     Right lower leg: No edema.     Left lower leg: No edema.     Comments:  See HPI for foot exam if done 2+ DP on right, 2+ PT on left  Lymphadenopathy:     Cervical: No cervical adenopathy.  Skin:    General: Skin is warm and dry.  Findings: No rash.  Neurological:     Mental Status: He is alert.       Results for orders placed or performed in visit on 11/17/20  POCT glycosylated hemoglobin (Hb A1C)  Result Value Ref Range   Hemoglobin A1C 6.7 (A) 4.0 - 5.6 %   HbA1c POC (<> result, manual entry)     HbA1c, POC (prediabetic range)     HbA1c, POC (controlled diabetic range)     Assessment & Plan:  This visit occurred during the SARS-CoV-2 public health emergency.  Safety protocols were in place, including screening questions prior to the visit, additional usage of staff PPE, and extensive cleaning of exam room while observing appropriate contact time as indicated for disinfecting solutions.   Problem List Items Addressed This Visit    Severe obesity (BMI 35.0-39.9) with comorbidity ([place])    Encouraged ongoing efforts for sustainable weight loss.       Relevant Medications   metFORMIN (GLUCOPHAGE) 500 MG tablet   Diabetes mellitus type 2, controlled, with complications (HCC) - Primary    Chronic, stable on daily metformin. He continues low carb diet.  He may need updated A1c for work purposes - future order placed.       Relevant Medications   metFORMIN (GLUCOPHAGE) 500 MG tablet   Other Relevant Orders   POCT glycosylated hemoglobin (Hb A1C) (Completed)   POCT glycosylated hemoglobin (Hb A1C)   Adjustment disorder    Continue trazodone for insomnia associated with adjustment disorder after brother's passing. Continue valium PRN (sparing use, last filled 12/2019).           Meds ordered this encounter  Medications  . traZODone (DESYREL) 50 MG tablet    Sig: Take 0.5-1 tablets (25-50 mg total) by mouth at bedtime as  needed for sleep.    Dispense:  90 tablet    Refill:  3  . diazepam (VALIUM) 5 MG tablet    Sig: Take 1 tablet (5 mg total) by mouth every 12 (twelve) hours as needed for anxiety or sedation (sleep).    Dispense:  30 tablet    Refill:  0  . metFORMIN (GLUCOPHAGE) 500 MG tablet    Sig: Take 3 tablets (1,500 mg total) by mouth daily with breakfast.    Dispense:  270 tablet    Refill:  1   Orders Placed This Encounter  Procedures  . POCT glycosylated hemoglobin (Hb A1C)  . POCT glycosylated hemoglobin (Hb A1C)    Standing Status:   Future    Standing Expiration Date:   05/19/2021    Patient Instructions  We will request latest eye exam from Dr Wallace Going with [facility].  You are doing well today Continue current medicines  Return as needed or in 6 months for physical.  Schedule lab visit for A1c if needed.    Follow up plan: Return in about 6 months (around 05/19/2021), or if symptoms worsen or fail to improve, for annual exam, prior fasting for blood work.  Ria Bush, MD

## 2020-11-17 NOTE — Assessment & Plan Note (Addendum)
Chronic, stable on daily metformin. He continues low carb diet.  He may need updated A1c for work purposes - future order placed.

## 2020-11-17 NOTE — Assessment & Plan Note (Signed)
Continue trazodone for insomnia associated with adjustment disorder after brother's passing. Continue valium PRN (sparing use, last filled 12/2019).

## 2020-11-18 ENCOUNTER — Encounter: Payer: Self-pay | Admitting: Family Medicine

## 2020-11-28 DIAGNOSIS — Z20822 Contact with and (suspected) exposure to covid-19: Secondary | ICD-10-CM | POA: Diagnosis not present

## 2020-11-28 DIAGNOSIS — U071 COVID-19: Secondary | ICD-10-CM | POA: Diagnosis not present

## 2020-11-30 ENCOUNTER — Encounter: Payer: Self-pay | Admitting: Family Medicine

## 2020-11-30 ENCOUNTER — Telehealth: Payer: Self-pay

## 2020-11-30 ENCOUNTER — Telehealth (INDEPENDENT_AMBULATORY_CARE_PROVIDER_SITE_OTHER): Payer: BC Managed Care – PPO | Admitting: Family Medicine

## 2020-11-30 DIAGNOSIS — U071 COVID-19: Secondary | ICD-10-CM

## 2020-11-30 HISTORY — DX: COVID-19: U07.1

## 2020-11-30 MED ORDER — NIRMATRELVIR/RITONAVIR (PAXLOVID)TABLET: 3.0000 | ORAL_TABLET | Freq: Two times a day (BID) | ORAL | 0 refills | Status: AC

## 2020-11-30 MED ORDER — PANTOPRAZOLE SODIUM 40 MG PO TBEC: 40.0000 mg | DELAYED_RELEASE_TABLET | Freq: Every day | ORAL | 0 refills | Status: DC

## 2020-11-30 NOTE — Telephone Encounter (Signed)
Please offer phone call visit at end of day to discuss COVID treatment options.

## 2020-11-30 NOTE — Telephone Encounter (Signed)
Patient notified as instructed by telephone and verbalized understanding. Patient stated that he is unable to do a virtual visit on his phone or computer. Patient stated that he would like to have a phone call visit to discuss treatment options. Patient was advised that Dr. Danise Mina is adding him to the end of his schedule today. Patient stated that he really appreciated it.

## 2020-11-30 NOTE — Telephone Encounter (Signed)
Pt said he tested + for covid after [facility]. The next care provider advised the pt to ck with PCP since has CAD, hypertension and diabetic. Pt wants to know if Dr Darnell Level thinks pt needs to be on oral med or take an infusion. On 11/28/20 pt had some fever (not sure of exact reading for fever); Now pt does not have fever but does have runny nose, head congestion, infrequent dry cough, and scratchy S/T; the S/T is almost gone. Pt has not had SOB and no vomiting; pt said for couple of days pt has had soft BMs but would not call it diarrhea (no watery stools)the patient does not have loss of taste or smell. No available appt at [facility] today and pt said he could not do appt on 12/01/20. Pt uses [facility]. Pt had Moderna vaccine on 09/20/19, 10/19/19, first covid Moderna booster on 07/10/2020 and got 2nd moderna covid booster on 11/24/20. Updated immunization list. Pt wants cb with Dr Darnell Level opinion if pt needs to take any medications. Pt said he does not feel that bad today. Sending note to DR Baldwin Crown CMA.

## 2020-11-30 NOTE — Assessment & Plan Note (Addendum)
Recommendation:  rec full dose Paxlovid - sent to pharmacy.  Discussed this is approved under emergency use authorization.  Update if not improving with treatment   Drug interactions:  Clopidogrel - discussed it may decrease antiplatelet effect  Diazepam - rec hold this while on med Hydrocodone - rec hold this while on med Omeprazole - rec hold while on med - will send in pantoprazole in its place Rosuvastatin - cut in half while on med  Tramadol - rec hold while on med Trazodone - takes 25mg  - rec hold while on med PDE5 inhibitors - rec hold while on med   Your COVID19 PCR test is POSITIVE.  Let us know right away if any worsening shortness of breath or persistent productive cough or fever.   Follow these current CDC guidelines for self-isolation: - Stay home for 5 days, starting the day after your symptoms (The first day is really day 0). - If you have no symptoms or your symptoms are resolving after 5 days, you can leave your house. - Continue to wear a mask around others for 5 additional days. **If you have a fever, continue to stay home until your fever resolves for 24 hours without fever-reducing medications.**  Here are some of the supportive therapies that may help with symptoms of a COVID-19 infection: A. Zinc Lozenge 75 to 100mg  daily B. Vitamin C 500mg  twice a day C. Vitamin D (Cholecalciferol) 2000 IU daily -----------

## 2020-11-30 NOTE — Progress Notes (Signed)
[name] - 65 y.o. male  MRN [id number]  Date of Birth: [birth year]  PCP: Ria Bush, MD  This service was provided via telemedicine. Phone Visit performed on 11/30/2020    Rationale for phone visit along with limitations reviewed. I discussed the limitations, risks, security and privacy concerns of performing a phone visit and the availability of in person appointments. I also discussed with the patient that there may be a patient responsible charge related to this service. Patient consented to telephone encounter.    Location of patient: at home Location of provider: in office [place] @ [facility] Name of referring provider: N/A   Names of persons and role in encounter: Provider: Ria Bush, MD  Patient: Eric [name]  Other: N/A   Time on call: 5:01pm - 5:20pm   Subjective: Chief Complaint  Patient presents with  . Cough    C/o cough, runny nose, congestion and sore throat.  Seen at [facility] on 11/28/20 and tested COVID+.     HPI:  Retired on Friday.   First day of symptoms: unsure - thinks started around 11/25/2020  Tested COVID positive: 11/27/2020 home test, tested again positive 11/28/2020 at [facility]  Current symptoms: nasal congestion, sneezing, mild cough, mild diarrhea Did have 12 hours of body aches after latest booster that resolved.  No: fevers/chills, HA, abd pain, nausea, dyspnea or wheezing Treatments to date: tylenol  Risk factors include: DM, CAD, obesity, age   COVID vaccination status: COVID vaccine x2, booster x2, all Moderna, latest booster 11/24/2020   Objective/Observations:  No physical exam or vital signs collected unless specifically identified below.   BP 138/81   Pulse 69   Temp (!) 97.3 F (36.3 C)   Ht 5\' 9"  (1.753 m)   Wt 248 lb (112.5 kg)   BMI 36.62 kg/m    Respiratory status: speaks in complete sentences without evident shortness of breath.   Lab Results  Component Value Date   CREATININE 0.86  05/12/2020   BUN 11 05/12/2020   NA 138 05/12/2020   K 4.4 05/12/2020   CL 100 05/12/2020   CO2 29 05/12/2020  GFR = 92  Assessment/Plan:  COVID-19 virus infection Recommendation:  rec full dose Paxlovid - sent to pharmacy.  Discussed this is approved under emergency use authorization.  Update if not improving with treatment   Drug interactions:  Clopidogrel - discussed it may decrease antiplatelet effect  Diazepam - rec hold this while on med Hydrocodone - rec hold this while on med Omeprazole - rec hold while on med - will send in pantoprazole in its place Rosuvastatin - cut in half while on med  Tramadol - rec hold while on med Trazodone - takes 25mg  - rec hold while on med PDE5 inhibitors - rec hold while on med   Your COVID19 PCR test is POSITIVE.  Let us know [name] away if any worsening shortness of breath or persistent productive cough or fever.   Follow these current CDC guidelines for self-isolation: - Stay home for 5 days, starting the day after your symptoms (The first day is really day 0). - If you have no symptoms or your symptoms are resolving after 5 days, you can leave your house. - Continue to wear a mask around others for 5 additional days. **If you have a fever, continue to stay home until your fever resolves for 24 hours without fever-reducing medications.**  Here are some of the supportive therapies that may help with symptoms  of a COVID-19 infection: A. Zinc Lozenge 75 to 100mg  daily B. Vitamin C 500mg  twice a day C. Vitamin D (Cholecalciferol) 2000 IU daily -----------   I discussed the assessment and treatment plan with the patient. The patient was provided an opportunity to ask questions and all were answered. The patient agreed with the plan and demonstrated an understanding of the instructions.  Lab Orders  No laboratory test(s) ordered today    Meds ordered this encounter  Medications  . nirmatrelvir/ritonavir EUA (PAXLOVID) TABS     Sig: Take 3 tablets by mouth 2 (two) times daily for 5 days. Patient GFR is 92. Take nirmatrelvir (150 mg) 2 tablet(s) twice daily for 5 days and ritonavir (100 mg) one tablet twice daily for 5 days.    Dispense:  30 tablet    Refill:  0  . pantoprazole (PROTONIX) 40 MG tablet    Sig: Take 1 tablet (40 mg total) by mouth daily.    Dispense:  5 tablet    Refill:  0    The patient was advised to call back or seek an in-person evaluation if the symptoms worsen or if the condition fails to improve as anticipated.  Ria Bush, MD

## 2021-01-05 DIAGNOSIS — C44520 Squamous cell carcinoma of anal skin: Secondary | ICD-10-CM | POA: Diagnosis not present

## 2021-02-03 IMAGING — MR MR SHOULDER*R* W/O CM
5 of 6 series · 32 of 40 positions shown · non-contrast
Comparison: None.

CLINICAL DATA: No known injury, limited range of motion.

EXAM:
MRI OF THE RIGHT SHOULDER WITHOUT CONTRAST
TECHNIQUE: Multiplanar, multisequence MR imaging of the shoulder was performed.
No intravenous contrast was administered.

[Series 5: T2 fat-sat · axial · right · 4.0mm · 0.44mm/px · z∈[-34,+85]mm · 7 of 26 slices shown (1 of 4)]
[im 1/26]
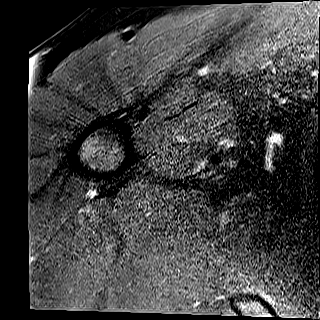
[im 5/26]
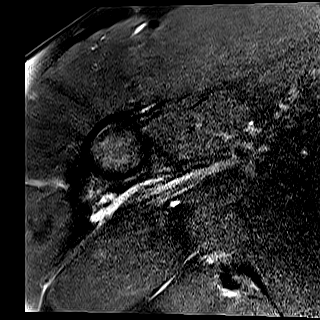
[im 9/26]
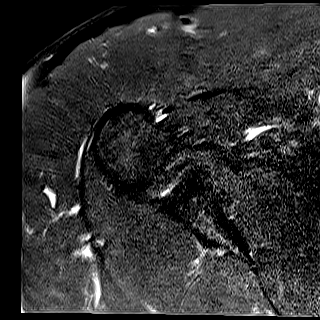
[im 13/26]
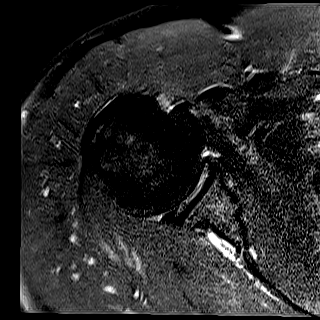
[im 17/26]
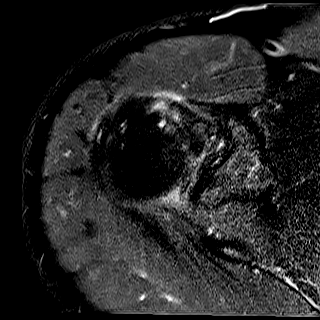
[im 21/26]
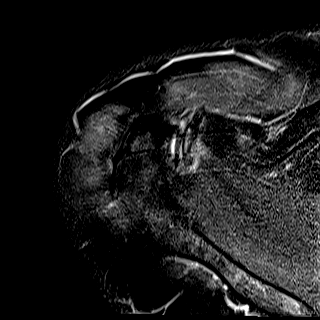
[im 26/26]
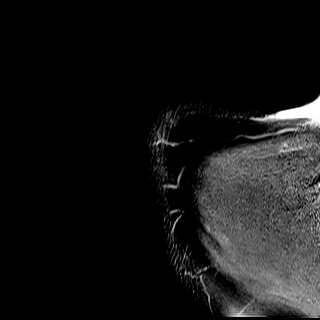

[Series 6: PD · oblique · right · 4.0mm · 0.44mm/px · 7 of 26 slices shown]
[im 1/26]
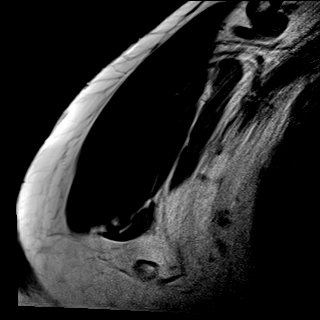
[im 5/26]
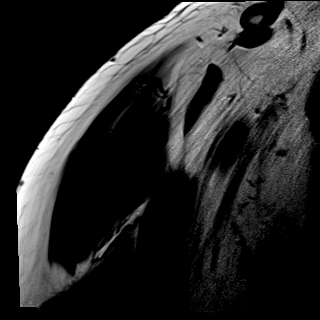
[im 9/26]
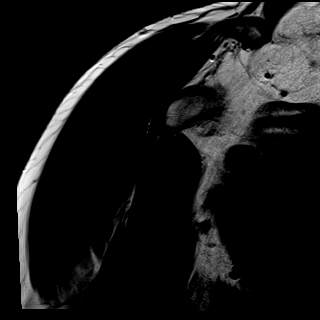
[im 13/26]
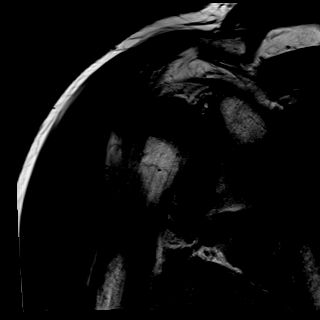
[im 17/26]
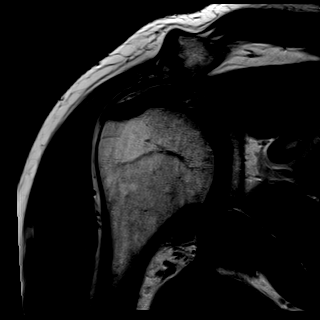
[im 21/26]
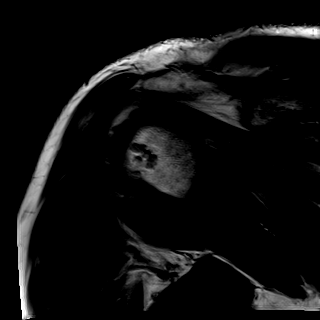
[im 26/26]
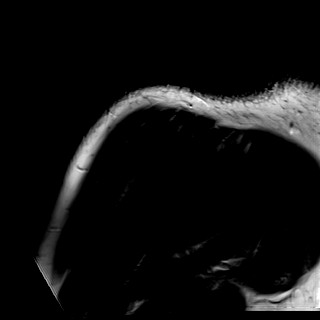

[Series 7: T2 fat-sat · oblique · right · 4.0mm · 0.44mm/px · 7 of 26 slices shown (2 of 4)]
[im 1/26]
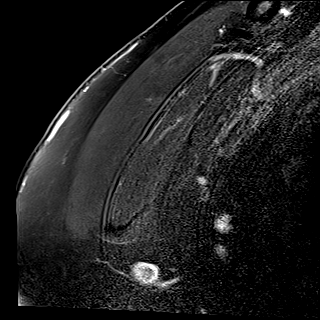
[im 5/26]
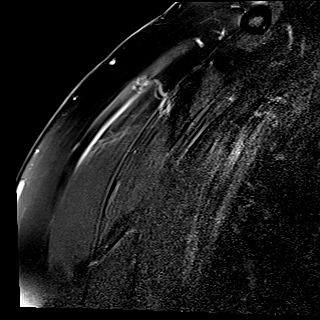
[im 9/26]
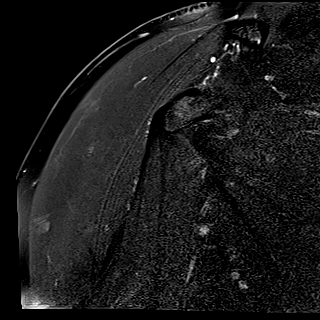
[im 13/26]
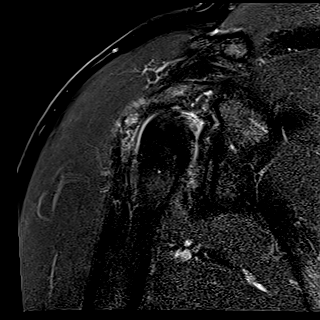
[im 17/26]
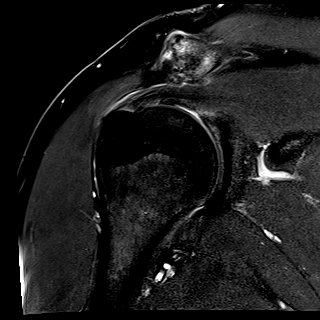
[im 21/26]
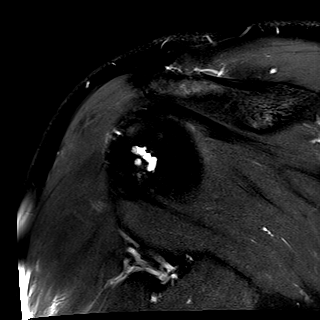
[im 26/26]
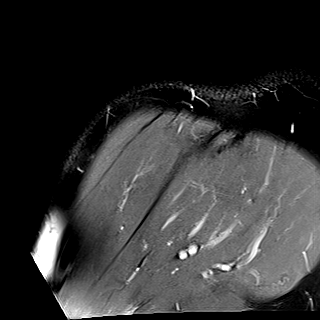

[Series 8: T2 fat-sat · oblique · right · 4.0mm · 0.23mm/px · 6 of 24 slices shown (3 of 4)]
[im 1/24]
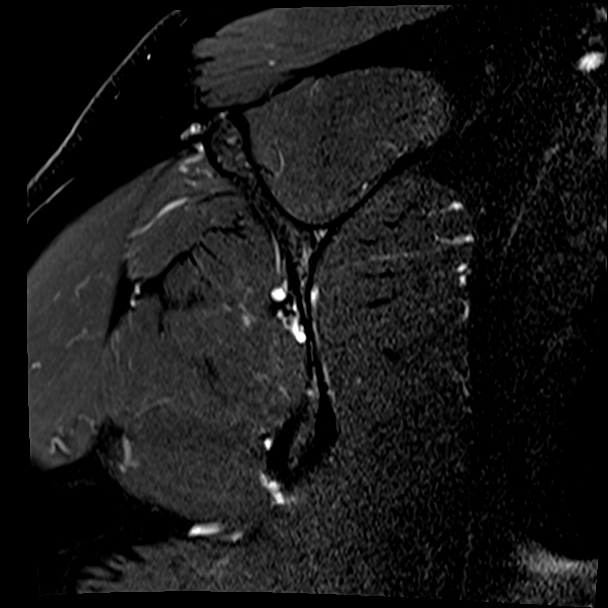
[im 5/24]
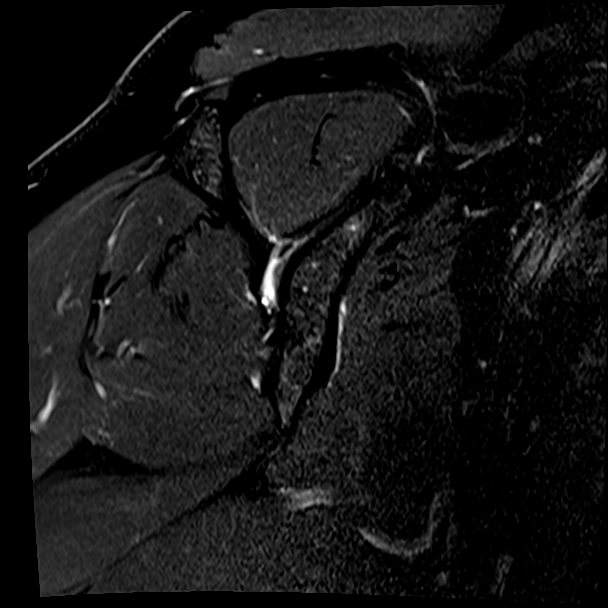
[im 10/24]
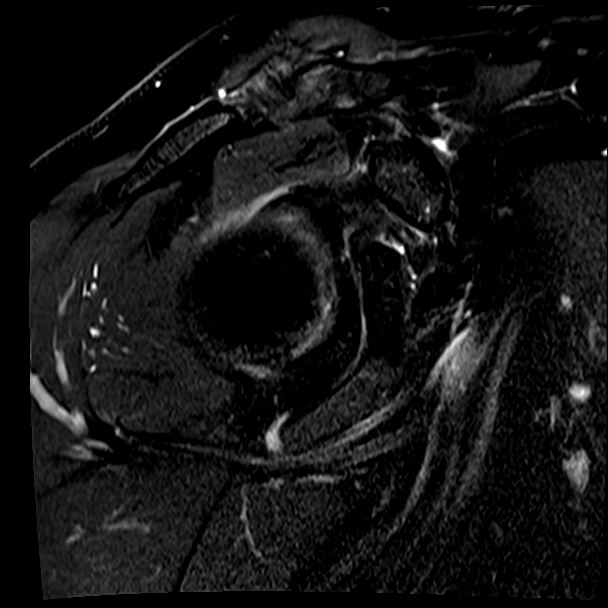
[im 14/24]
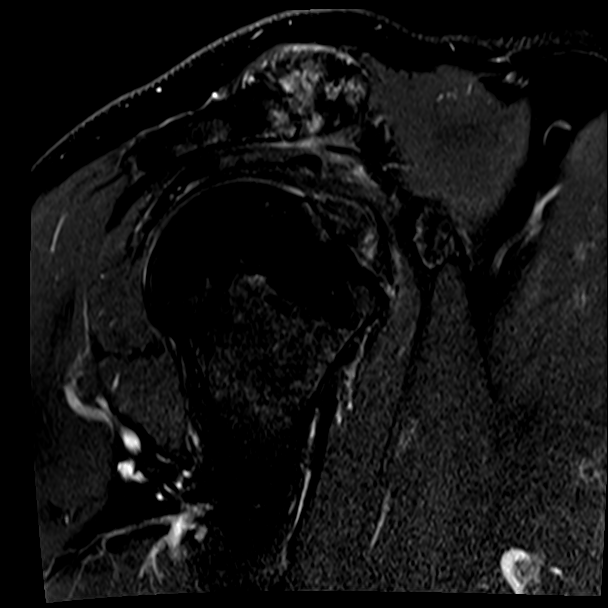
[im 19/24]
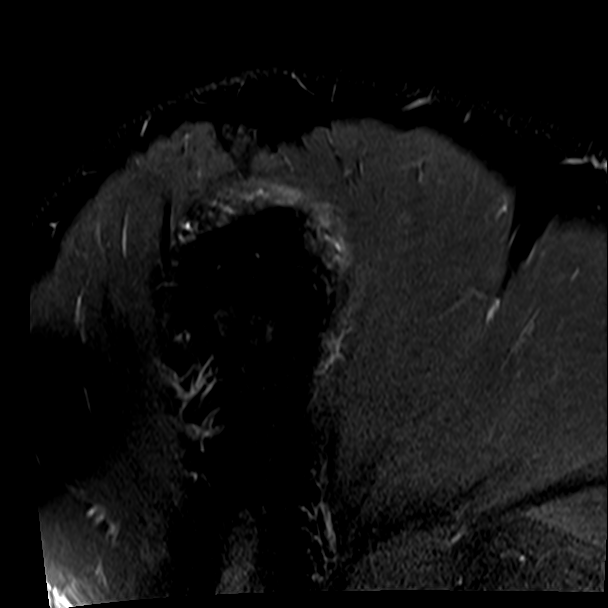
[im 24/24]
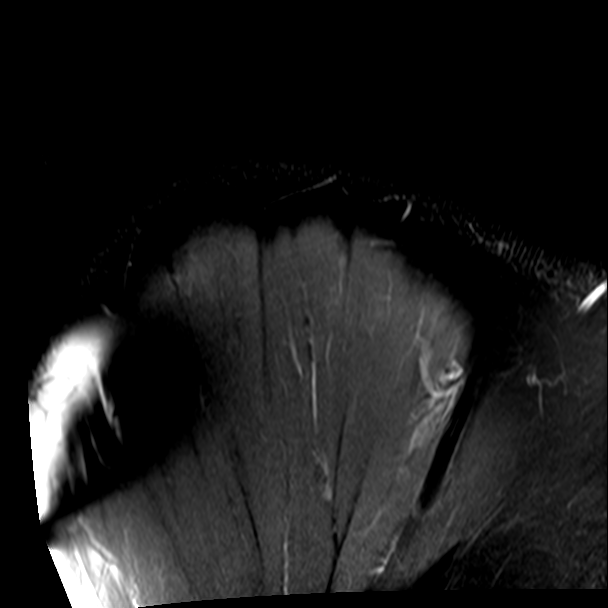

[Series 10: T2 fat-sat · axial · right · 4.0mm · 0.44mm/px · z∈[-34,+42]mm · 5 of 26 slices shown (4 of 4)]
[im 1/26]
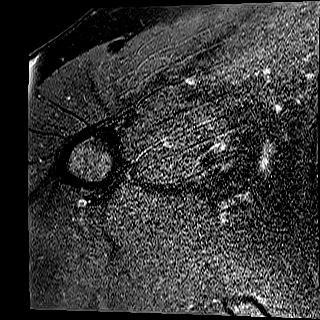
[im 5/26]
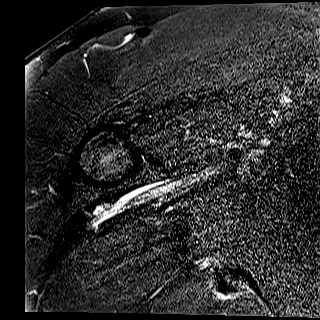
[im 9/26]
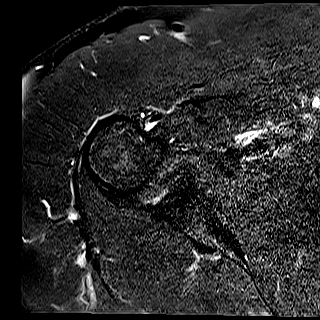
[im 13/26]
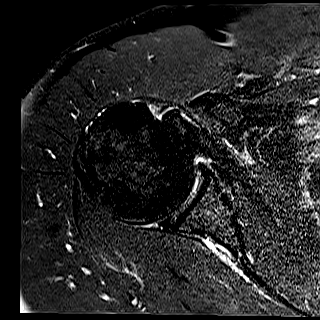
[im 17/26]
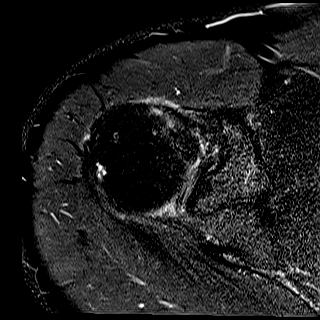

[32 of 40 positions shown; findings below may reference images not displayed]

FINDINGS: Rotator cuff: Severe tendinosis of the supraspinatus tendon with a
insertional interstitial tear. Moderate tendinosis of the
infraspinatus tendon. Mild tendinosis of the subscapularis tendon
with a small partial tear peripherally. Teres minor tendon is
intact.

Muscles: No muscle atrophy or edema. No intramuscular fluid
collection or hematoma.

Biceps Long Head: Moderate tendinosis of the intra-articular portion
of the long head of the biceps tendon.

Acromioclavicular Joint: Severe arthropathy of the acromioclavicular
joint. Type I acromion. Trace subacromial/subdeltoid bursal fluid.

Glenohumeral Joint: No joint effusion. No chondral defect.

Labrum: Degeneration of the superior labrum.

Bones: No fracture or dislocation. No aggressive osseous lesion.
Subcortical reactive marrow changes in the posterior humeral head.

Other: No fluid collection or hematoma.
IMPRESSION: 1. Severe tendinosis of the supraspinatus tendon with a insertional
interstitial tear.
2. Moderate tendinosis of the infraspinatus tendon.
3. Mild tendinosis of the subscapularis tendon with a small partial
tear peripherally.
4. Moderate tendinosis of the intra-articular portion of the long
head of the biceps tendon.

## 2021-02-22 ENCOUNTER — Ambulatory Visit (INDEPENDENT_AMBULATORY_CARE_PROVIDER_SITE_OTHER): Payer: BC Managed Care – PPO | Admitting: Cardiovascular Disease

## 2021-02-22 ENCOUNTER — Encounter: Payer: Self-pay | Admitting: Cardiovascular Disease

## 2021-02-22 ENCOUNTER — Other Ambulatory Visit: Payer: Self-pay

## 2021-02-22 VITALS — BP 120/64 | HR 79 | Ht 69.0 in | Wt 248.1 lb

## 2021-02-22 DIAGNOSIS — I209 Angina pectoris, unspecified: Secondary | ICD-10-CM | POA: Diagnosis not present

## 2021-02-22 DIAGNOSIS — I25118 Atherosclerotic heart disease of native coronary artery with other forms of angina pectoris: Secondary | ICD-10-CM

## 2021-02-22 DIAGNOSIS — I10 Essential (primary) hypertension: Secondary | ICD-10-CM | POA: Diagnosis not present

## 2021-02-22 DIAGNOSIS — E782 Mixed hyperlipidemia: Secondary | ICD-10-CM

## 2021-02-22 DIAGNOSIS — E118 Type 2 diabetes mellitus with unspecified complications: Secondary | ICD-10-CM | POA: Diagnosis not present

## 2021-02-22 MED ORDER — TADALAFIL 20 MG PO TABS
20.0000 mg | ORAL_TABLET | Freq: Every day | ORAL | 1 refills | Status: DC | PRN
Start: 2021-02-22 — End: 2022-05-24

## 2021-02-22 MED ORDER — SILDENAFIL CITRATE 20 MG PO TABS: 20.0000 mg | ORAL_TABLET | Freq: Three times a day (TID) | ORAL | 6 refills | Status: DC | PRN

## 2021-02-22 NOTE — Patient Instructions (Addendum)
Myoview for CAD/angina, DOT physical clearance  Medication Instructions:  No changes  If you need a refill on your cardiac medications before your next appointment, please call your pharmacy.    Lab work: No new labs needed   If you have labs (blood work) drawn today and your tests are completely normal, you will receive your results only by: [place] (if you have MyChart) OR A paper copy in the mail If you have any lab test that is abnormal or we need to change your treatment, we will call you to review the results.     Testing/Procedures: [place]  Your caregiver has ordered a Stress Test with nuclear imaging. The purpose of this test is to evaluate the blood supply to your heart muscle. This procedure is referred to as a "Non-Invasive Stress Test." This is because other than having an IV started in your vein, nothing is inserted or "invades" your body. Cardiac stress tests are done to find areas of poor blood flow to the heart by determining the extent of coronary artery disease (CAD). Some patients exercise on a treadmill, which naturally increases the blood flow to your heart, while others who are  unable to walk on a treadmill due to physical limitations have a pharmacologic/chemical stress agent called Lexiscan . This medicine will mimic walking on a treadmill by temporarily increasing your coronary blood flow.   Please note: these test may take anywhere between 2-4 hours to complete  PLEASE REPORT TO [facility] AT THE FIRST DESK WILL DIRECT YOU WHERE TO GO  Date of Procedure:_________________________________  Arrival Time for Procedure:______________________________  Instructions regarding medication:   _XX___ : Hold diabetes medication morning of procedure  PLEASE NOTIFY THE OFFICE AT LEAST 24 HOURS IN ADVANCE IF YOU ARE UNABLE TO KEEP YOUR APPOINTMENT.  [phone] AND  PLEASE NOTIFY NUCLEAR MEDICINE AT [facility] AT LEAST 24  HOURS IN ADVANCE IF YOU ARE UNABLE TO KEEP YOUR APPOINTMENT. [phone]  How to prepare for your Myoview test:  Do not eat or drink after midnight No caffeine for 24 hours prior to test No smoking 24 hours prior to test. Your medication may be taken with water.  If your doctor stopped a medication because of this test, do not take that medication. Ladies, please do not wear dresses.  Skirts or pants are appropriate. Please wear a short sleeve shirt. No perfume, cologne or lotion. Wear comfortable walking shoes. No heels!    Follow-Up: At [facility], you and your health needs are our priority.  As part of our continuing mission to provide you with exceptional heart care, we have created designated Provider Care Teams.  These Care Teams include your primary Cardiologist (physician) and Advanced Practice Providers (APPs -  Physician Assistants and Nurse Practitioners) who all work together to provide you with the care you need, when you need it.  You will need a follow up appointment in 12 months  Providers on your designated Care Team:   Murray Hodgkins, NP Christell Faith, PA-C Marrianne Mood, PA-C Cadence Kathlen Mody, [place]  Any Other Special Instructions Will Be Listed Below (If Applicable).  COVID-19 Vaccine Information can be found at: [email or web address] For questions related to vaccine distribution or appointments, please email [email or web address] or call [phone].
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
## 2021-02-23 ENCOUNTER — Telehealth: Payer: Self-pay | Admitting: Cardiovascular Disease

## 2021-02-23 NOTE — Telephone Encounter (Signed)
Nuc med stress test ordered at checkout 8/1 .  Attempted to schedule. Lmov.

## 2021-03-04 ENCOUNTER — Other Ambulatory Visit: Payer: Self-pay

## 2021-03-04 ENCOUNTER — Encounter
Admission: RE | Admit: 2021-03-04 | Discharge: 2021-03-04 | Disposition: A | Discharge status: Home / Self Care | Payer: BC Managed Care – PPO | Source: Ambulatory Visit | Attending: Cardiovascular Disease | Admitting: Cardiovascular Disease

## 2021-03-04 DIAGNOSIS — I25118 Atherosclerotic heart disease of native coronary artery with other forms of angina pectoris: Secondary | ICD-10-CM | POA: Insufficient documentation

## 2021-03-04 DIAGNOSIS — I209 Angina pectoris, unspecified: Secondary | ICD-10-CM | POA: Diagnosis not present

## 2021-03-04 LAB — NM MYOCAR MULTI W/SPECT W/WALL MOTION / EF
Estimated workload: 1 METS
Exercise duration (min): 0 min
Exercise duration (sec): 0 s
LV dias vol: 124 mL (ref 62–150)
LV sys vol: 43 mL
MPHR: 157 {beats}/min
Peak HR: 86 {beats}/min
Percent HR: 54 %
Rest HR: 71 {beats}/min
SDS: 1
SRS: 4
SSS: 0
TID: 0.95

## 2021-03-04 MED ORDER — TECHNETIUM TC 99M TETROFOSMIN IV KIT
10.0000 | PACK | Freq: Once | INTRAVENOUS | Status: AC | PRN
  Administered 2021-03-04: 9.95 via INTRAVENOUS

## 2021-03-04 MED ORDER — REGADENOSON 0.4 MG/5ML IV SOLN
0.4000 mg | Freq: Once | INTRAVENOUS | Status: AC
  Administered 2021-03-04: 0.4 mg via INTRAVENOUS
  Filled 2021-03-04: qty 5

## 2021-03-04 MED ORDER — TECHNETIUM TC 99M TETROFOSMIN IV KIT
30.0000 | PACK | Freq: Once | INTRAVENOUS | Status: AC | PRN
  Administered 2021-03-04: 30.09 via INTRAVENOUS

## 2021-03-08 ENCOUNTER — Telehealth: Payer: Self-pay

## 2021-03-08 NOTE — Telephone Encounter (Signed)
Left detail message on VM of pt's recent results okay by DPR onpt's cell phone, Dr. Rockey Situ advised   "Stress test  No ischemia  Normal cardiac function  Normal stress test"  At this time, no further recommendations or medications changes, advised to call office for any concerns or questions, otherwise will see at next visit.

## 2021-03-11 ENCOUNTER — Other Ambulatory Visit (INDEPENDENT_AMBULATORY_CARE_PROVIDER_SITE_OTHER): Payer: BC Managed Care – PPO

## 2021-03-11 ENCOUNTER — Other Ambulatory Visit: Payer: Self-pay

## 2021-03-11 DIAGNOSIS — E118 Type 2 diabetes mellitus with unspecified complications: Secondary | ICD-10-CM

## 2021-03-11 LAB — POCT GLYCOSYLATED HEMOGLOBIN (HGB A1C): Hemoglobin A1C: 6.8 % — AB (ref 4.0–5.6)

## 2021-04-27 DIAGNOSIS — H40003 Preglaucoma, unspecified, bilateral: Secondary | ICD-10-CM | POA: Diagnosis not present

## 2021-05-18 ENCOUNTER — Other Ambulatory Visit: Payer: Self-pay

## 2021-05-18 ENCOUNTER — Encounter: Payer: Self-pay | Admitting: Family Medicine

## 2021-05-18 ENCOUNTER — Ambulatory Visit (INDEPENDENT_AMBULATORY_CARE_PROVIDER_SITE_OTHER): Payer: BC Managed Care – PPO | Admitting: Family Medicine

## 2021-05-18 VITALS — BP 124/80 | HR 76 | Temp 97.9°F | Ht 69.0 in | Wt 251.0 lb

## 2021-05-18 DIAGNOSIS — Z87891 Personal history of nicotine dependence: Secondary | ICD-10-CM | POA: Diagnosis not present

## 2021-05-18 DIAGNOSIS — E785 Hyperlipidemia, unspecified: Secondary | ICD-10-CM | POA: Diagnosis not present

## 2021-05-18 DIAGNOSIS — E118 Type 2 diabetes mellitus with unspecified complications: Secondary | ICD-10-CM

## 2021-05-18 DIAGNOSIS — I2510 Atherosclerotic heart disease of native coronary artery without angina pectoris: Secondary | ICD-10-CM

## 2021-05-18 DIAGNOSIS — E1169 Type 2 diabetes mellitus with other specified complication: Secondary | ICD-10-CM

## 2021-05-18 DIAGNOSIS — J069 Acute upper respiratory infection, unspecified: Secondary | ICD-10-CM

## 2021-05-18 DIAGNOSIS — C44520 Squamous cell carcinoma of anal skin: Secondary | ICD-10-CM

## 2021-05-18 MED ORDER — EPINEPHRINE 0.3 MG/0.3ML IJ SOAJ: INTRAMUSCULAR | 0 refills | Status: DC

## 2021-05-18 MED ORDER — METFORMIN HCL 500 MG PO TABS: 1500.0000 mg | ORAL_TABLET | Freq: Every day | ORAL | 3 refills | Status: DC

## 2021-05-18 MED ORDER — TRAZODONE HCL 50 MG PO TABS: 25.0000 mg | ORAL_TABLET | Freq: Every evening | ORAL | 3 refills | Status: DC | PRN

## 2021-05-18 MED ORDER — SILDENAFIL CITRATE 100 MG PO TABS: 50.0000 mg | ORAL_TABLET | Freq: Every day | ORAL | 3 refills | Status: DC | PRN

## 2021-05-18 NOTE — Assessment & Plan Note (Signed)
Appreciate cards care. Continues aspirin/plavix. Recent reassuring stress test.

## 2021-05-18 NOTE — Assessment & Plan Note (Signed)
Supportive care reviewed.  

## 2021-05-18 NOTE — Assessment & Plan Note (Signed)
Interested in further discussion with lung cancer screening program - referral placed.

## 2021-05-18 NOTE — Assessment & Plan Note (Deleted)
Preventative protocols reviewed and updated unless pt declined. Discussed healthy diet and lifestyle.  

## 2021-05-18 NOTE — Patient Instructions (Addendum)
Labs today.  Return as needed or in 6 months for physical.  Good to see you today

## 2021-05-18 NOTE — Assessment & Plan Note (Signed)
Chronic, stable on metformin TID. Recent A1c reviewed with patient.

## 2021-05-18 NOTE — Assessment & Plan Note (Signed)
Appreciate derm and gen surgery care. He will need yearly skin exams with dermatologist Nehemiah Massed).

## 2021-05-18 NOTE — Assessment & Plan Note (Signed)
Update FLP on crestor.  The ASCVD Risk score (Arnett DK, et al., 2019) failed to calculate for the following reasons:   The valid total cholesterol range is 130 to 320 mg/dL

## 2021-05-18 NOTE — Progress Notes (Signed)
Patient ID: Eric [name], male    DOB: Jun 11, 1956, 65 y.o.   MRN: [id number]  This visit was conducted in person.  BP 124/80   Pulse 76   Temp 97.9 F (36.6 C) (Temporal)   Ht 5\' 9"  (1.753 m)   Wt 251 lb (113.9 kg)   SpO2 94%   BMI 37.07 kg/m    CC: CPE - converted to follow up visit Subjective:   HPI: Eric [name] is a 65 y.o. male presenting on 05/18/2021 for Annual Exam    1 day early for CPE - converted to f/u visit.   Currently with ST and congestion that started 5 days ago. Took 2 negative COVID tests at home. Has been treating with flonase. Head > chest congestion.   Recent repeat excisional surgery by Dr Bary Castilla for in situ SCC arising within perianal SK, second excision done with clear margins, complicated by wound dehiscence. Wound finally healed. Recommended yearly skin exams by dermatology. He already sees derm Dr Nehemiah Massed.   Recent stress test reassuringly ok ([place]).   Recent A1c 6.8%.   Preventative: COLONOSCOPY WITH PROPOFOL; 03/14/2016; TAx1, diverticulosis, rpt 7-9 yrs Lollie Sails)  Prostate cancer screening - has had normal in past.  Lung cancer screening - discussed, interetsed in learning more - referrral placed  Flu shot yearly  COVID vaccine Moderna 08/2019, 09/2019, booster 06/2020, 11/2020 Pneumovax 03/10/2014 [facility]) Tetanus 2007, Tdap 2016 zostavax 07/2013  shingrix - 07/2017, 10/2017  Seat belt use discussed  Sunscreen use discussed, no changing moles on skin Ex smoker - quit 03/2007. Smoked 1ppd for 20 yrs. ~20 PY hx  Alcohol - averages 5-10 beers/wk. No hangover.  Sleep - averaging 6-7 hours sleep/night Dentist Q6 mo  Eye exam Q6 mo - monitoring glaucoma  Lives alone Occupation: truck driver, stocks stores  Activity: started walking  Diet: good water, fruits/vegetables some - healthier eating choices      Relevant past medical, surgical, family and social history reviewed and updated as indicated. Interim medical  history since our last visit reviewed. Allergies and medications reviewed and updated. Outpatient Medications Prior to Visit  Medication Sig Dispense Refill   acetaminophen (TYLENOL) 500 MG tablet Take 500-1,000 mg by mouth every 6 (six) hours as needed (pain.).     Ascorbic Acid (VITAMIN C) 1000 MG tablet Take 1,000 mg by mouth every evening.      aspirin EC 81 MG tablet Take 162 mg by mouth daily.  90 tablet 3   cholecalciferol (VITAMIN D3) 25 MCG (1000 UT) tablet Take 1,000 Units by mouth every evening.      clopidogrel (PLAVIX) 75 MG tablet TAKE 1 TABLET(75 MG) BY MOUTH DAILY 90 tablet 3   Coenzyme Q10 200 MG capsule Take 200 mg by mouth daily in the afternoon.      cyclobenzaprine (FLEXERIL) 10 MG tablet Take 1 tablet (10 mg total) by mouth 3 (three) times daily as needed for muscle spasms. 30 tablet 1   diazepam (VALIUM) 5 MG tablet Take 1 tablet (5 mg total) by mouth every 12 (twelve) hours as needed for anxiety or sedation (sleep). 30 tablet 0   diphenhydrAMINE (BENADRYL) 25 MG tablet Take 25 mg by mouth every 6 (six) hours as needed for allergies.      famotidine (PEPCID) 20 MG tablet Take 1 tablet (20 mg total) by mouth 2 (two) times daily. 180 tablet 3   fexofenadine (ALLEGRA) 180 MG tablet Take 1 tablet (180 mg total)  by mouth daily. 90 tablet 4   hydrochlorothiazide (HYDRODIURIL) 25 MG tablet TAKE 1 TABLET BY MOUTH DAILY 90 tablet 3   losartan (COZAAR) 100 MG tablet TAKE 1 TABLET(100 MG) BY MOUTH DAILY 90 tablet 3   Methylcellulose, Laxative, (CITRUCEL) 500 MG TABS Take 1,000 mg by mouth at bedtime.     Multiple Vitamin (MULTIVITAMIN WITH MINERALS) TABS tablet Take 1 tablet by mouth every evening.     nitroGLYCERIN (NITROSTAT) 0.4 MG SL tablet Place 1 tablet (0.4 mg total) under the tongue every 5 (five) minutes x 3 doses as needed for chest pain. PLACE 1 TABLET UNDER THE TONGUE IF NEEDED EVERY 5 MINUTES FOR CHEST PAIN FOR 3 DOSES IF NO RELIEF AFER FIRST DOSE CALL PRESCRIBER OR 911 25  tablet 3   Omega-3 Fatty Acids (FISH OIL CONCENTRATE) 1000 MG CAPS Take 3,000 mg by mouth every evening.      omeprazole (PRILOSEC) 40 MG capsule TAKE 1 CAPSULE(40 MG) BY MOUTH DAILY 90 capsule 3   rosuvastatin (CRESTOR) 40 MG tablet TAKE 1 TABLET(40 MG) BY MOUTH DAILY 90 tablet 3   tadalafil (CIALIS) 20 MG tablet Take 1 tablet (20 mg total) by mouth daily as needed for erectile dysfunction. 30 tablet 1   traMADol (ULTRAM) 50 MG tablet Take 50 mg by mouth every 6 (six) hours as needed.     TURMERIC PO Take 1,000 mg by mouth in the morning and at bedtime.     EPINEPHRINE 0.3 mg/0.3 mL IJ SOAJ injection INJECT INTRAMUSCULARY AS DIRECTED 4 each 0   metFORMIN (GLUCOPHAGE) 500 MG tablet Take 3 tablets (1,500 mg total) by mouth daily with breakfast. 270 tablet 1   sildenafil (REVATIO) 20 MG tablet Take 1 tablet (20 mg total) by mouth 3 (three) times daily as needed. 90 tablet 6   traZODone (DESYREL) 50 MG tablet Take 0.5-1 tablets (25-50 mg total) by mouth at bedtime as needed for sleep. 90 tablet 3   No facility-administered medications prior to visit.     Per HPI unless specifically indicated in ROS section below Review of Systems  Constitutional:  Negative for activity change, appetite change, chills, fatigue, fever and unexpected weight change.  HENT:  Positive for congestion. Negative for hearing loss.   Eyes:  Negative for visual disturbance.  Respiratory:  Negative for cough, chest tightness, shortness of breath and wheezing.   Cardiovascular:  Negative for chest pain, palpitations and leg swelling.  Gastrointestinal:  Negative for abdominal distention, abdominal pain, blood in stool, constipation, diarrhea, nausea and vomiting.  Genitourinary:  Negative for difficulty urinating and hematuria.  Musculoskeletal:  Negative for arthralgias, myalgias and neck pain.  Skin:  Negative for rash.  Neurological:  Negative for dizziness, seizures, syncope and headaches.  Hematological:  Negative for  adenopathy. Does not bruise/bleed easily.  Psychiatric/Behavioral:  Negative for dysphoric mood. The patient is not nervous/anxious.    Objective:  BP 124/80   Pulse 76   Temp 97.9 F (36.6 C) (Temporal)   Ht 5\' 9"  (1.753 m)   Wt 251 lb (113.9 kg)   SpO2 94%   BMI 37.07 kg/m   Wt Readings from Last 3 Encounters:  05/18/21 251 lb (113.9 kg)  02/22/21 248 lb 2 oz (112.5 kg)  11/30/20 248 lb (112.5 kg)      Physical Exam Vitals and nursing note reviewed.  Constitutional:      General: He is not in acute distress.    Appearance: Normal appearance. He is well-developed. He is  not ill-appearing.  HENT:     Head: Normocephalic and atraumatic.     Right Ear: Hearing, tympanic membrane, ear canal and external ear normal.     Left Ear: Hearing, tympanic membrane, ear canal and external ear normal.  Eyes:     General: No scleral icterus.    Extraocular Movements: Extraocular movements intact.     Conjunctiva/sclera: Conjunctivae normal.     Pupils: Pupils are equal, round, and reactive to light.  Neck:     Thyroid: No thyroid mass or thyromegaly.  Cardiovascular:     Rate and Rhythm: Normal rate and regular rhythm.     Pulses: Normal pulses.          Radial pulses are 2+ on the right side and 2+ on the left side.     Heart sounds: Normal heart sounds. No murmur heard. Pulmonary:     Effort: Pulmonary effort is normal. No respiratory distress.     Breath sounds: Normal breath sounds. No wheezing, rhonchi or rales.  Abdominal:     General: Bowel sounds are normal. There is no distension.     Palpations: Abdomen is soft. There is no mass.     Tenderness: There is no abdominal tenderness. There is no guarding or rebound.     Hernia: No hernia is present.  Musculoskeletal:        General: Normal range of motion.     Cervical back: Normal range of motion and neck supple.     Right lower leg: No edema.     Left lower leg: No edema.  Lymphadenopathy:     Cervical: No cervical  adenopathy.  Skin:    General: Skin is warm and dry.     Findings: No rash.  Neurological:     General: No focal deficit present.     Mental Status: He is alert and oriented to person, place, and time.  Psychiatric:        Mood and Affect: Mood normal.        Behavior: Behavior normal.        Thought Content: Thought content normal.        Judgment: Judgment normal.      Results for orders placed or performed in visit on 03/11/21  POCT glycosylated hemoglobin (Hb A1C)  Result Value Ref Range   Hemoglobin A1C 6.8 (A) 4.0 - 5.6 %   HbA1c POC (<> result, manual entry)     HbA1c, POC (prediabetic range)     HbA1c, POC (controlled diabetic range)      Assessment & Plan:  This visit occurred during the SARS-CoV-2 public health emergency.  Safety protocols were in place, including screening questions prior to the visit, additional usage of staff PPE, and extensive cleaning of exam room while observing appropriate contact time as indicated for disinfecting solutions.   Problem List Items Addressed This Visit     Hyperlipidemia associated with type 2 diabetes mellitus ([place])    Update FLP on crestor.  The ASCVD Risk score (Arnett DK, et al., 2019) failed to calculate for the following reasons:   The valid total cholesterol range is 130 to 320 mg/dL       Relevant Medications   EPINEPHrine 0.3 mg/0.3 mL IJ SOAJ injection   sildenafil (VIAGRA) 100 MG tablet   metFORMIN (GLUCOPHAGE) 500 MG tablet   Other Relevant Orders   Lipid panel   Basic metabolic panel   Severe obesity (BMI 35.0-39.9) with comorbidity ([place])   Relevant  Medications   metFORMIN (GLUCOPHAGE) 500 MG tablet   CAD (coronary artery disease)    Appreciate cards care. Continues aspirin/plavix. Recent reassuring stress test.       Relevant Medications   EPINEPHrine 0.3 mg/0.3 mL IJ SOAJ injection   sildenafil (VIAGRA) 100 MG tablet   Diabetes mellitus type 2, controlled, with complications (HCC) - Primary     Chronic, stable on metformin TID. Recent A1c reviewed with patient.       Relevant Medications   metFORMIN (GLUCOPHAGE) 500 MG tablet   Viral URI    Supportive care reviewed.      Squamous cell carcinoma of anal skin    Appreciate derm and gen surgery care. He will need yearly skin exams with dermatologist Nehemiah Massed).       Ex-smoker    Interested in further discussion with lung cancer screening program - referral placed.       Relevant Orders   Ambulatory Referral Lung Cancer Screening [facility]     Meds ordered this encounter  Medications   EPINEPHrine 0.3 mg/0.3 mL IJ SOAJ injection    Sig: INJECT INTRAMUSCULARY AS DIRECTED    Dispense:  4 each    Refill:  0   sildenafil (VIAGRA) 100 MG tablet    Sig: Take 0.5-1 tablets (50-100 mg total) by mouth daily as needed for erectile dysfunction.    Dispense:  20 tablet    Refill:  3   traZODone (DESYREL) 50 MG tablet    Sig: Take 0.5-1 tablets (25-50 mg total) by mouth at bedtime as needed for sleep.    Dispense:  90 tablet    Refill:  3   metFORMIN (GLUCOPHAGE) 500 MG tablet    Sig: Take 3 tablets (1,500 mg total) by mouth daily with breakfast.    Dispense:  270 tablet    Refill:  3    Orders Placed This Encounter  Procedures   Lipid panel   Basic metabolic panel   Ambulatory Referral Lung Cancer Screening [facility]    Referral Priority:   Routine    Referral Type:   Consultation    Referral Reason:   Specialty Services Required    Number of Visits Requested:   1     Patient Instructions  Labs today.  Return as needed or in 6 months for physical.  Good to see you today  Follow up plan: Return in about 6 months (around 11/16/2021) for follow up visit, annual exam, prior fasting for blood work.  Ria Bush, MD

## 2021-05-19 LAB — BASIC METABOLIC PANEL
BUN: 11 mg/dL (ref 6–23)
CO2: 26 mEq/L (ref 19–32)
Calcium: 9.7 mg/dL (ref 8.4–10.5)
Chloride: 101 mEq/L (ref 96–112)
Creatinine, Ser: 0.82 mg/dL (ref 0.40–1.50)
GFR: 93.17 mL/min (ref >60.00)
Glucose, Bld: 132 mg/dL — ABNORMAL HIGH (ref 70–99)
Potassium: 4.1 mEq/L (ref 3.5–5.1)
Sodium: 136 mEq/L (ref 135–145)

## 2021-05-19 LAB — LIPID PANEL
Cholesterol: 108 mg/dL (ref 0–200)
HDL: 34.2 mg/dL — ABNORMAL LOW (ref >39.00)
LDL Cholesterol: 36 mg/dL (ref 0–99)
NonHDL: 74
Total CHOL/HDL Ratio: 3
Triglycerides: 192 mg/dL — ABNORMAL HIGH (ref 0.0–149.0)
VLDL: 38.4 mg/dL (ref 0.0–40.0)

## 2021-05-25 ENCOUNTER — Other Ambulatory Visit: Payer: Self-pay

## 2021-05-25 ENCOUNTER — Ambulatory Visit
Admission: RE | Admit: 2021-05-25 | Discharge: 2021-05-25 | Disposition: A | Discharge status: Home / Self Care | Payer: BC Managed Care – PPO | Source: Ambulatory Visit | Attending: Emergency Medicine | Admitting: Emergency Medicine

## 2021-05-25 VITALS — BP 142/84 | HR 84 | Temp 98.4°F | Resp 18 | Ht 69.0 in | Wt 251.1 lb

## 2021-05-25 DIAGNOSIS — J0100 Acute maxillary sinusitis, unspecified: Secondary | ICD-10-CM

## 2021-05-25 MED ORDER — AMOXICILLIN-POT CLAVULANATE 875-125 MG PO TABS: 1.0000 | ORAL_TABLET | Freq: Two times a day (BID) | ORAL | 0 refills | Status: DC

## 2021-05-25 NOTE — ED Triage Notes (Signed)
Pt c/o nasal congestion, cough, sore throat. Started about 2 weeks ago. Denies fever.
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
## 2021-05-25 NOTE — Discharge Instructions (Addendum)
Today you are being treated for sinus infection  Take Augmentin twice a day for 7 days   May continue Delsym, Flonase for comfort, may add Mucinex in addition to help drain congestion  May return to urgent care for further evaluation if  symptoms are not improving

## 2021-07-12 ENCOUNTER — Other Ambulatory Visit: Payer: Self-pay | Admitting: Cardiovascular Disease

## 2021-09-02 ENCOUNTER — Telehealth: Payer: Self-pay | Admitting: Cardiovascular Disease

## 2021-09-02 NOTE — Telephone Encounter (Signed)
Spoke with patient. He stated that he is feeling fine now. He wanted to know if Dr. Rockey Situ would like to see him sooner than his 6 month follow up.  Recall letter for the 6 month follow up was sent out on 08/30/21, I scheduled patient for Dr. Donivan Scull next available appointment on 09/28/21.  I informed patient that I would send this message to Dr. Rockey Situ and his nurse, and if they wanted to see him any earlier, someone would reach out to him.

## 2021-09-02 NOTE — Telephone Encounter (Signed)
STAT if HR is under 50 or over 120 (normal HR is 60-100 beats per minute)  What is your heart rate? Not sure right now feels normal rate now   Do you have a log of your heart rate readings (document readings)? Tuesday night 117 stayed up for a couple of hours came down to 90 before bed  Do you have any other symptoms? Choked and coughed on a chip then ate a frosty at [employer] and head felt weird before this tach episode  Patient wants to know if he needs eval from Dr. Rockey Situ to discuss

## 2021-09-15 ENCOUNTER — Other Ambulatory Visit: Payer: Self-pay | Admitting: *Deleted

## 2021-09-15 DIAGNOSIS — Z87891 Personal history of nicotine dependence: Secondary | ICD-10-CM

## 2021-09-28 ENCOUNTER — Other Ambulatory Visit: Payer: Self-pay

## 2021-09-28 ENCOUNTER — Ambulatory Visit (INDEPENDENT_AMBULATORY_CARE_PROVIDER_SITE_OTHER): Payer: BC Managed Care – PPO | Admitting: Acute Care

## 2021-09-28 ENCOUNTER — Encounter: Payer: Self-pay | Admitting: Cardiovascular Disease

## 2021-09-28 ENCOUNTER — Encounter: Payer: Self-pay | Admitting: Acute Care

## 2021-09-28 ENCOUNTER — Ambulatory Visit (INDEPENDENT_AMBULATORY_CARE_PROVIDER_SITE_OTHER): Payer: BC Managed Care – PPO | Admitting: Cardiovascular Disease

## 2021-09-28 VITALS — BP 126/64 | HR 78 | Ht 69.0 in | Wt 256.0 lb

## 2021-09-28 DIAGNOSIS — I471 Supraventricular tachycardia: Secondary | ICD-10-CM

## 2021-09-28 DIAGNOSIS — Z87891 Personal history of nicotine dependence: Secondary | ICD-10-CM | POA: Diagnosis not present

## 2021-09-28 MED ORDER — PROPRANOLOL HCL 20 MG PO TABS: 20.0000 mg | ORAL_TABLET | Freq: Three times a day (TID) | ORAL | 3 refills | Status: AC | PRN

## 2021-09-28 NOTE — Patient Instructions (Signed)
Thank you for participating in the [facility]. °It was our pleasure to meet you today. °We will call you with the results of your scan within the next few days. °Your scan will be assigned a Lung RADS category score by the physicians reading the scans.  °This Lung RADS score determines follow up scanning.  °See below for description of categories, and follow up screening recommendations. °We will be in touch to schedule your follow up screening annually or based on recommendations of our providers. °We will fax a copy of your scan results to your Primary Care Physician, or the physician who referred you to the program, to ensure they have the results. °Please call the office if you have any questions or concerns regarding your scanning experience or results.  °Our office number is [phone]. °Please speak with Denise Phelps, RN. She is our Lung Cancer Screening RN. °If she is unavailable when you call, please have the office staff send her a message. She will return your call at her earliest convenience. °Remember, if your scan is normal, we will scan you annually as long as you continue to meet the criteria for the program. (Age 55-77, Current smoker or smoker who has quit within the last 15 years). °If you are a smoker, remember, quitting is the single most powerful action that you can take to decrease your risk of lung cancer and other pulmonary, breathing related problems. °We know quitting is hard, and we are here to help.  °Please let us know if there is anything we can do to help you meet your goal of quitting. °If you are a former smoker, congratulations. We are proud of you! Remain smoke free! °Remember you can refer friends or family members through the number above.  °We will screen them to make sure they meet criteria for the program. °Thank you for helping us take better care of you by participating in Lung Screening. ° °You can receive free nicotine replacement therapy  ( patches, gum or mints) by calling 1-800-QUIT NOW. Please call so we can get you on the path to becoming  a non-smoker. I know it is hard, but you can do this! ° °Lung RADS Categories: ° °Lung RADS 1: no nodules or definitely non-concerning nodules.  °Recommendation is for a repeat annual scan in 12 months. ° °Lung RADS 2:  nodules that are non-concerning in appearance and behavior with a very low likelihood of becoming an active cancer. °Recommendation is for a repeat annual scan in 12 months. ° °Lung RADS 3: nodules that are probably non-concerning , includes nodules with a low likelihood of becoming an active cancer.  Recommendation is for a 6-month repeat screening scan. Often noted after an upper respiratory illness. We will be in touch to make sure you have no questions, and to schedule your 6-month scan. ° °Lung RADS 4 A: nodules with concerning findings, recommendation is most often for a follow up scan in 3 months or additional testing based on our provider's assessment of the scan. We will be in touch to make sure you have no questions and to schedule the recommended 3 month follow up scan. ° °Lung RADS 4 B:  indicates findings that are concerning. We will be in touch with you to schedule additional diagnostic testing based on our provider's  assessment of the scan. ° °Hypnosis for smoking cessation  °[employer] °[phone] ° °Acupuncture for smoking cessation  °[facility] °[phone]  °

## 2021-09-28 NOTE — Patient Instructions (Addendum)
Medication Instructions:  ?Your physician has recommended you make the following change in your medication:  ? ?START taking propanolol (Inderall) 20 mg three times a day as needed for fast heart rate  ? ?If you need a refill on your cardiac medications before your next appointment, please call your pharmacy.  ? ?Talk with Dr. Darnell Level about ozempic injection for cardiac benefit ? ?Lab work: ?No new labs needed ? ?Testing/Procedures: ?No new testing needed ? ?Follow-Up: ?At [facility], you and your health needs are our priority.  As part of our continuing mission to provide you with exceptional heart care, we have created designated Provider Care Teams.  These Care Teams include your primary Cardiologist (physician) and Advanced Practice Providers (APPs -  Physician Assistants and Nurse Practitioners) who all work together to provide you with the care you need, when you need it. ? ?You will need a follow up appointment in 6 months ? ?Providers on your designated Care Team:   ?Murray Hodgkins, NP ?Christell Faith, PA-C ?Cadence Kathlen Mody, PA-C ? ?COVID-19 Vaccine Information can be found at: [email or web address] For questions related to vaccine distribution or appointments, please email [email or web address] or call [phone].  ? ?
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
## 2021-09-28 NOTE — Progress Notes (Signed)
Virtual Visit via Telephone Note ? ?I connected with Eric [name] on 06/08/21 at  2:00 PM EST by telephone and verified that I am speaking with the correct person using two identifiers. ? ?Location: ?Patient: Home ?Provider: Working from home  ?  ?I discussed the limitations, risks, security and privacy concerns of performing an evaluation and management service by telephone and the availability of in person appointments. I also discussed with the patient that there may be a patient responsible charge related to this service. The patient expressed understanding and agreed to proceed. ? ?Shared Decision Making Visit Lung Cancer Screening Program ?[phone]) ? ? ?Eligibility: ?Age 66 y.o. ?Pack Years Smoking History Calculation 28 ?(# packs/per year x # years smoked) ?Recent History of coughing up blood  no ?Unexplained weight loss? no ?( >Than 15 pounds within the last 6 months ) ?Prior History Lung / other cancer no ?(Diagnosis within the last 5 years already requiring surveillance chest CT Scans). ?Smoking Status Former Smoker ?Former Smokers: Years since quit: 15 years ? Quit Date: 2008 ? ?Visit Components: ?Discussion included one or more decision making aids. yes ?Discussion included risk/benefits of screening. yes ?Discussion included potential follow up diagnostic testing for abnormal scans. yes ?Discussion included meaning and risk of over diagnosis. yes ?Discussion included meaning and risk of False Positives. yes ?Discussion included meaning of total radiation exposure. yes ? ?Counseling Included: ?Importance of adherence to annual lung cancer LDCT screening. yes ?Impact of comorbidities on ability to participate in the program. yes ?Ability and willingness to under diagnostic treatment. yes ? ?Smoking Cessation Counseling: ?Current Smokers:  ?Discussed importance of smoking cessation. yes ?Information about tobacco cessation classes and interventions provided to patient. yes ?Patient provided with  "ticket" for LDCT Scan. yes ?Symptomatic Patient. yes ? Counseling NA ?Diagnosis Code: Tobacco Use Z72.0 ?Asymptomatic Patient yes ? Counseling NA ?Former Smokers:  ?Discussed the importance of maintaining cigarette abstinence. yes ?Diagnosis Code: Personal History of Nicotine Dependence. Y50.354 ?Information about tobacco cessation classes and interventions provided to patient. Yes ?Patient provided with "ticket" for LDCT Scan. yes ?Written Order for Lung Cancer Screening with LDCT placed in Epic. Yes ?(CT Chest Lung Cancer Screening Low Dose W/O CM) [id number] ?Z12.2-Screening of respiratory organs ?Z87.891-Personal history of nicotine dependence ? ? ?I spent 25 minutes of face to face time with him discussing the risks and benefits of lung cancer screening. We viewed a power point together that explained in detail the above noted topics. We took the time to pause the power point at intervals to allow for questions to be asked and answered to ensure understanding. We discussed that he had taken the single most powerful action possible to decrease his risk of developing lung cancer when he quit smoking. I counseled him to remain smoke free, and to contact me if he ever had the desire to smoke again so that I can provide resources and tools to help support the effort to remain smoke free. We discussed the time and location of the scan, and that either  [name] RN or I will call with the results within  24-48 hours of receiving them. He has my card and contact information in the event he needs to speak with me, in addition to a copy of the power point we reviewed as a resource. He verbalized understanding of all of the above and had no further questions upon leaving the office.  ? ? ? ?I explained to the patient that there has been a high incidence  of coronary artery disease noted on these exams. I explained that this is a non-gated exam therefore degree or severity cannot be determined. This patient is on statin  therapy. I have asked the patient to follow-up with their PCP regarding any incidental finding of coronary artery disease and management with diet or medication as they feel is clinically indicated. The patient verbalized understanding of the above and had no further questions. ? ? ?Abbey Veith D. Harris, NP-C ?[facility] ?Personal contact information can be found on Amion  ?09/28/2021, 11:07 AM ? ? ? ? ? ? ? ? ? ?

## 2021-09-29 ENCOUNTER — Other Ambulatory Visit: Payer: Self-pay

## 2021-09-29 ENCOUNTER — Ambulatory Visit
Admission: RE | Admit: 2021-09-29 | Discharge: 2021-09-29 | Disposition: A | Discharge status: Home / Self Care | Payer: BC Managed Care – PPO | Source: Ambulatory Visit | Attending: Acute Care | Admitting: Acute Care

## 2021-09-29 DIAGNOSIS — Z87891 Personal history of nicotine dependence: Secondary | ICD-10-CM | POA: Insufficient documentation

## 2021-09-30 DIAGNOSIS — M75111 Incomplete rotator cuff tear or rupture of right shoulder, not specified as traumatic: Secondary | ICD-10-CM | POA: Diagnosis not present

## 2021-10-01 ENCOUNTER — Other Ambulatory Visit: Payer: Self-pay | Admitting: Orthopedic Surgery

## 2021-10-01 ENCOUNTER — Telehealth: Payer: Self-pay | Admitting: Cardiovascular Disease

## 2021-10-01 DIAGNOSIS — M75111 Incomplete rotator cuff tear or rupture of right shoulder, not specified as traumatic: Secondary | ICD-10-CM

## 2021-10-01 NOTE — Telephone Encounter (Signed)
Dr. Rockey Situ ?You saw this patient three days ago in clinic and mentioned possible shoulder surgery. We have now formally received a request for clearance. Can you provide your recommendations regarding  ?clearance for shoulder surgery  ?and holding plavix? ? ?Thanks  ?Angie ? ?

## 2021-10-01 NOTE — Telephone Encounter (Signed)
? ?  Pre-operative Risk Assessment  ?  ?Patient Name: Eric [name]  ?DOB: Mar 28, 1956 ?MRN: [id number]  ? ?  ? ?Request for Surgical Clearance   ? ?Procedure:   R Shoulder Arthroscopic rotator cuff repair vs regeneten paten application subacromial compression distal clavicle excision and biceps tenodesis ? ?Date of Surgery:  Clearance 10-12-21                              ?   ?Surgeon:  sunny patel  ?Surgeon's Group or Practice Name:  [facility]  ?Phone number:  [phone] ?Fax number:  [phone] ?  ?Type of Clearance Requested:   ?- Medical  ?- Pharmacy:  Hold please advise     ?  ?Type of Anesthesia:  Not Indicated ?  ?Additional requests/questions:   ? ?Signed, ?Clarisse Gouge   ?10/01/2021, 9:47 AM  ? ?

## 2021-10-02 ENCOUNTER — Ambulatory Visit
Admission: RE | Admit: 2021-10-02 | Discharge: 2021-10-02 | Disposition: A | Discharge status: Home / Self Care | Payer: BC Managed Care – PPO | Source: Ambulatory Visit | Attending: Orthopedic Surgery | Admitting: Orthopedic Surgery

## 2021-10-02 ENCOUNTER — Other Ambulatory Visit: Payer: Self-pay

## 2021-10-02 DIAGNOSIS — M75111 Incomplete rotator cuff tear or rupture of right shoulder, not specified as traumatic: Secondary | ICD-10-CM | POA: Diagnosis not present

## 2021-10-02 DIAGNOSIS — M25511 Pain in right shoulder: Secondary | ICD-10-CM | POA: Diagnosis not present

## 2021-10-04 ENCOUNTER — Other Ambulatory Visit: Payer: Self-pay | Admitting: Orthopedic Surgery

## 2021-10-04 NOTE — Telephone Encounter (Signed)
? ?  Name: KEANAN MELANDER  ?DOB: 03-05-1956  ?MRN: [id number]  ? ?Primary Cardiologist: Ida Rogue, MD ? ?Chart reviewed as part of pre-operative protocol coverage.   RITHY MANDLEY was last seen on 10/01/21 by Dr. Rockey Situ.  ? ?Per Dr. Rockey Situ: ?Acceptable risk for surgery, no further cardiac testing needed  ?Would hold Plavix 5 days prior to shoulder surgery and restart when cleared by surgeon. Would remain on low-dose aspirin 81 mg daily  ? ?Therefore, based on ACC/AHA guidelines, the patient would be at acceptable risk for the planned procedure without further cardiovascular testing.  ? ?I will route this recommendation to the requesting party via Epic fax function and remove from pre-op pool. Please call with questions. ? ?Ledora Bottcher, PA ?10/04/2021, 1:42 PM ? ?

## 2021-10-05 ENCOUNTER — Encounter: Payer: Self-pay | Admitting: Orthopedic Surgery

## 2021-10-07 ENCOUNTER — Other Ambulatory Visit: Payer: Self-pay | Admitting: Acute Care

## 2021-10-07 ENCOUNTER — Telehealth: Payer: Self-pay | Admitting: Acute Care

## 2021-10-07 NOTE — Telephone Encounter (Signed)
Left message for pt to call back do discuss recent CT results.  ?

## 2021-10-10 ENCOUNTER — Other Ambulatory Visit: Payer: Self-pay | Admitting: Cardiovascular Disease

## 2021-10-11 NOTE — Discharge Instructions (Addendum)
Post-Op Instructions - Rotator Cuff Repair ? ?1. Bracing: You will wear a shoulder immobilizer or sling for 4 weeks.  ? ?2. Driving: No driving for at least 2 weeks post-op. When driving, do not wear the immobilizer. Ideally, we recommend no driving for 4 weeks while sling is in place as one arm will be immobilized.  ? ?3. Activity: No active lifting for 2 months. Wrist, hand, and elbow motion only. Avoid lifting the upper arm away from the body except for hygiene. You are permitted to bend and straighten the elbow passively only (no active elbow motion). You may use your hand and wrist for typing, writing, and managing utensils (cutting food). Do not lift more than a coffee cup for 8 weeks.  When sleeping or resting, inclined positions (recliner chair or wedge pillow) and a pillow under the forearm for support may provide better comfort for up to 4 weeks.  Avoid long distance travel for 4 weeks. ? ?Return to normal activities after rotator cuff repair repair normally takes 6 months on average. If rehab goes very well, may be able to do most activities at 4 months, except overhead or contact sports. ? ?4. Physical Therapy: Begins 3-4 days after surgery, and proceed 1 time per week for the first 6 weeks, then 1-2 times per week from weeks 6-20 post-op. ? ?5. Medications:  ?- You will be provided a prescription for narcotic pain medicine. After surgery, take 1-2 narcotic tablets every 4 hours if needed for severe pain.  ?- A prescription for anti-nausea medication will be provided in case the narcotic medicine causes nausea - take 1 tablet every 6 hours only if nauseated.   ?- Take tylenol 1000 mg (2 Extra Strength tablets or 3 regular strength) every 8 hours for pain.  May decrease or stop tylenol 5 days after surgery if you are having minimal pain. ?- Resume daily Plavix the day after surgery. ?- DO NOT take ANY nonsteroidal anti-inflammatory pain medications (Advil, Motrin, Ibuprofen, Aleve, Naproxen, or Naprosyn).  These medicines can inhibit healing of your shoulder repair.  ? ? ?If you are taking prescription medication for anxiety, depression, insomnia, muscle spasm, chronic pain, or for attention deficit disorder, you are advised that you are at a higher risk of adverse effects with use of narcotics post-op, including narcotic addiction/dependence, depressed breathing, death. ?If you use non-prescribed substances: alcohol, marijuana, cocaine, heroin, methamphetamines, etc., you are at a higher risk of adverse effects with use of narcotics post-op, including narcotic addiction/dependence, depressed breathing, death. ?You are advised that taking > 50 morphine milligram equivalents (MME) of narcotic pain medication per day results in twice the risk of overdose or death. For your prescription provided: oxycodone 5 mg - taking more than 6 tablets per day would result in > 50 morphine milligram equivalents (MME) of narcotic pain medication. ?Be advised that we will prescribe narcotics short-term, for acute post-operative pain only - 3 weeks for major operations such as shoulder repair/reconstruction surgeries.  ? ? ? ?6. Post-Op Appointment: ? ?Your first post-op appointment will be 10-14 days post-op. ? ?7. Work or School: For most, but not all procedures, we advise staying out of work or school for at least 1 to 2 weeks in order to recover from the stress of surgery and to allow time for healing.  ? ?If you need a work or school note this can be provided.  ? ?8. Smoking: If you are a smoker, you need to refrain from smoking in the postoperative  period. The nicotine in cigarettes will inhibit healing of your shoulder repair and decrease the chance of successful repair. Similarly, nicotine containing products (gum, patches) should be avoided.  ? ?Post-operative Brace: ?Apply and remove the brace you received as you were instructed to at the time of fitting and as described in detail as the brace?s instructions for use indicate.   Wear the brace for the period of time prescribed by your physician.  The brace can be cleaned with soap and water and allowed to air dry only.  Should the brace result in increased pain, decreased feeling (numbness/tingling), increased swelling or an overall worsening of your medical condition, please contact your doctor immediately.  If an emergency situation occurs as a result of wearing the brace after normal business hours, please dial 911 and seek immediate medical attention.  Let your doctor know if you have any further questions about the brace issued to you. ?Refer to the shoulder sling instructions for use if you have any questions regarding the correct fit of your shoulder sling.  ?[facility]: [phone] ? ?Video that illustrates how to properly use a shoulder sling: ?"Instructions for Proper Use of an Orthopaedic Sling" ?[email or web address] ? ? ? ? ? ? ? ?PERIPHERAL NERVE BLOCK PATIENT INFORMATION ? ?Your surgeon has requested a peripheral nerve block for your surgery. This anesthetic technique provides excellent post-operative pain relief for you in a safe and effective manner. It will also help reduce the risk of nausea and vomiting and allow earlier discharge from the hospital.   ?The block is performed under sedation with ultrasound guidance prior to your procedure. Due to the sedation, your may or may not remember the block experience. The nerve block will begin to take effect anywhere from 5 to 30 minutes after being administered. You will be transported to the operating room from your surgery after the block is completed.   ?At the end of surgery, when the anesthesia wears off, you will notice a few things. Your may not be able to move or feel the part of your body targeted by the nerve block. These are normal experiences, and they will disappear as the block wears off.  ?If you had an interscalene nerve block performed (which is common for  shoulder surgery), your voice can be very hoarse and you may feel that you are not able to take as deep a breath as you did before surgery. Some patients may also notice a droopy eyelid on the affected side. These symptoms will resolve once the block wears off.  ?Pain control: ?The nerve block technique used is a single injection that can last anywhere from 1-3 days. The duration of the numbness can vary between individuals. After leaving the hospital, it is important that you begin to take your prescribed pain medication when you start to sense the nerve block wearing off. This will help you avoid unpleasant pain at the time the nerve block wears off, which can sometimes be in the middle of the night. The block will only cover pain in the areas targeted by the nerve block so if you experience surgical pain outside of that area, please take your prescribed pain medication. ?Management of the ?numb area?: ?After a nerve block, you cannot feel pain, pressure, or temperature in the affected area so there is an increased risk for injury. You should take extra care to protect the affected areas until sensation and movement returns. Please take caution to not come in contact with  extremely hot or cold items because you will not be able to sense or protect yourself form the extremes of temperature.  You may experience some persistent numbness after the procedure by most neurological deficits resolve over time and the incidence of serious long term neurological complications attributable to peripheral nerve blocks are relatively uncommon.   ? ?Information for Discharge Teaching: ?EXPAREL (bupivacaine liposome injectable suspension)  ? ?Your surgeon or anesthesiologist gave you EXPAREL(bupivacaine) to help control your pain after surgery.  ?EXPAREL is a local anesthetic that provides pain relief by numbing the tissue around the surgical site. ?EXPAREL is designed to release pain medication over time and can control pain for  up to 72 hours. ?Depending on how you respond to EXPAREL, you may require less pain medication during your recovery. ? ?Possible side effects: ?Temporary loss of sensation or ability to move in the area

## 2021-10-12 ENCOUNTER — Other Ambulatory Visit: Payer: Self-pay

## 2021-10-12 ENCOUNTER — Encounter: Payer: Self-pay | Admitting: Orthopedic Surgery

## 2021-10-12 ENCOUNTER — Ambulatory Visit: Payer: BC Managed Care – PPO | Admitting: Anesthesiology

## 2021-10-12 ENCOUNTER — Encounter
Admission: RE | Disposition: A | Discharge status: Home / Self Care | Payer: Self-pay | Source: Ambulatory Visit | Attending: Orthopedic Surgery

## 2021-10-12 ENCOUNTER — Ambulatory Visit
Admission: RE | Admit: 2021-10-12 | Discharge: 2021-10-12 | Disposition: A | Discharge status: Home / Self Care | Payer: BC Managed Care – PPO | Source: Ambulatory Visit | Attending: Orthopedic Surgery | Admitting: Orthopedic Surgery

## 2021-10-12 DIAGNOSIS — M19012 Primary osteoarthritis, left shoulder: Secondary | ICD-10-CM | POA: Diagnosis not present

## 2021-10-12 DIAGNOSIS — M25511 Pain in right shoulder: Secondary | ICD-10-CM | POA: Diagnosis not present

## 2021-10-12 DIAGNOSIS — M7581 Other shoulder lesions, right shoulder: Secondary | ICD-10-CM | POA: Diagnosis not present

## 2021-10-12 DIAGNOSIS — M7521 Bicipital tendinitis, right shoulder: Secondary | ICD-10-CM | POA: Diagnosis not present

## 2021-10-12 DIAGNOSIS — M75111 Incomplete rotator cuff tear or rupture of right shoulder, not specified as traumatic: Secondary | ICD-10-CM | POA: Insufficient documentation

## 2021-10-12 DIAGNOSIS — M19011 Primary osteoarthritis, right shoulder: Secondary | ICD-10-CM | POA: Diagnosis not present

## 2021-10-12 DIAGNOSIS — M75121 Complete rotator cuff tear or rupture of right shoulder, not specified as traumatic: Secondary | ICD-10-CM | POA: Diagnosis not present

## 2021-10-12 DIAGNOSIS — G8918 Other acute postprocedural pain: Secondary | ICD-10-CM | POA: Diagnosis not present

## 2021-10-12 DIAGNOSIS — M7541 Impingement syndrome of right shoulder: Secondary | ICD-10-CM | POA: Diagnosis not present

## 2021-10-12 HISTORY — PX: SUBACROMIAL DECOMPRESSION: SHX5174

## 2021-10-12 HISTORY — PX: ARTHOSCOPIC ROTAOR CUFF REPAIR: SHX5002

## 2021-10-12 HISTORY — PX: RESECTION DISTAL CLAVICAL: SHX5053

## 2021-10-12 HISTORY — PX: BICEPT TENODESIS: SHX5116

## 2021-10-12 LAB — GLUCOSE, CAPILLARY
Glucose-Capillary: 141 mg/dL — ABNORMAL HIGH (ref 70–99)
Glucose-Capillary: 158 mg/dL — ABNORMAL HIGH (ref 70–99)

## 2021-10-12 SURGERY — REPAIR, ROTATOR CUFF, ARTHROSCOPIC
Anesthesia: Regional | Site: Shoulder | Laterality: Right

## 2021-10-12 MED ORDER — OXYCODONE HCL 5 MG PO TABS: 5.0000 mg | ORAL_TABLET | ORAL | 0 refills | Status: DC | PRN

## 2021-10-12 MED ORDER — DEXAMETHASONE SODIUM PHOSPHATE 4 MG/ML IJ SOLN
INTRAMUSCULAR | Status: DC | PRN
  Administered 2021-10-12: 4 mg via INTRAVENOUS

## 2021-10-12 MED ORDER — EPHEDRINE SULFATE (PRESSORS) 50 MG/ML IJ SOLN
INTRAMUSCULAR | Status: DC | PRN
  Administered 2021-10-12 (×2): 10 mg via INTRAVENOUS

## 2021-10-12 MED ORDER — LACTATED RINGERS IV SOLN: INTRAVENOUS | Status: DC

## 2021-10-12 MED ORDER — CEFAZOLIN SODIUM-DEXTROSE 2-4 GM/100ML-% IV SOLN
2.0000 g | INTRAVENOUS | Status: AC
  Administered 2021-10-12: 2 g via INTRAVENOUS

## 2021-10-12 MED ORDER — ASPIRIN EC 325 MG PO TBEC: 325.0000 mg | DELAYED_RELEASE_TABLET | Freq: Every day | ORAL | 0 refills | Status: DC

## 2021-10-12 MED ORDER — ACETAMINOPHEN 500 MG PO TABS
1000.0000 mg | ORAL_TABLET | Freq: Three times a day (TID) | ORAL | 2 refills | Status: DC
Start: 2021-10-12 — End: 2022-04-22

## 2021-10-12 MED ORDER — GLYCOPYRROLATE 0.2 MG/ML IJ SOLN
INTRAMUSCULAR | Status: DC | PRN
  Administered 2021-10-12: .1 mg via INTRAVENOUS
  Administered 2021-10-12: .2 mg via INTRAVENOUS

## 2021-10-12 MED ORDER — MIDAZOLAM HCL 2 MG/2ML IJ SOLN
INTRAMUSCULAR | Status: DC | PRN
  Administered 2021-10-12: 2 mg via INTRAVENOUS

## 2021-10-12 MED ORDER — ONDANSETRON 4 MG PO TBDP: 4.0000 mg | ORAL_TABLET | Freq: Three times a day (TID) | ORAL | 0 refills | Status: DC | PRN

## 2021-10-12 MED ORDER — PROPOFOL 10 MG/ML IV BOLUS
INTRAVENOUS | Status: DC | PRN
  Administered 2021-10-12: 200 mg via INTRAVENOUS

## 2021-10-12 MED ORDER — FENTANYL CITRATE (PF) 100 MCG/2ML IJ SOLN
INTRAMUSCULAR | Status: DC | PRN
  Administered 2021-10-12: 50 ug via INTRAVENOUS

## 2021-10-12 MED ORDER — BUPIVACAINE HCL (PF) 0.5 % IJ SOLN
INTRAMUSCULAR | Status: DC | PRN
  Administered 2021-10-12: 20 mL via PERINEURAL

## 2021-10-12 MED ORDER — LACTATED RINGERS IV SOLN
INTRAVENOUS | Status: DC | PRN
  Administered 2021-10-12: 4 mL

## 2021-10-12 MED ORDER — BUPIVACAINE LIPOSOME 1.3 % IJ SUSP
INTRAMUSCULAR | Status: DC | PRN
  Administered 2021-10-12: 20 mL via PERINEURAL

## 2021-10-12 MED ORDER — LACTATED RINGERS IR SOLN
Status: DC | PRN
  Administered 2021-10-12: 18000 mL
  Administered 2021-10-12: 12000 mL

## 2021-10-12 MED ORDER — LIDOCAINE HCL (CARDIAC) PF 100 MG/5ML IV SOSY
PREFILLED_SYRINGE | INTRAVENOUS | Status: DC | PRN
  Administered 2021-10-12: 50 mg via INTRATRACHEAL

## 2021-10-12 SURGICAL SUPPLY — 55 items
ADAPTER IRRIG TUBE 2 SPIKE SOL (ADAPTER) ×6 IMPLANT
ADH SKN CLS APL DERMABOND .7 (GAUZE/BANDAGES/DRESSINGS) ×2
ADPR TBG 2 SPK PMP STRL ASCP (ADAPTER) ×4
ANCH SUT 2 SWLK 19.1 CLS EYLT (Anchor) ×2 IMPLANT
ANCH SUT 2.9 PUSHLOCK ANCH (Orthopedic Implant) ×2 IMPLANT
ANCHOR ICONIX SPEED 2.3 (Anchor) ×2 IMPLANT
ANCHOR SWIVELOCK BIO 4.75X19.1 (Anchor) ×2 IMPLANT
APL PRP STRL LF DISP 70% ISPRP (MISCELLANEOUS) ×2
BLADE SHAVER 4.5X7 STR FR (MISCELLANEOUS) ×3 IMPLANT
BUR BR 5.5 WIDE MOUTH (BURR) ×3 IMPLANT
CANNULA PART THRD DISP 5.75X7 (CANNULA) ×3 IMPLANT
CANNULA PARTIAL THREAD 2X7 (CANNULA) ×3 IMPLANT
CANNULA TWIST IN 8.25X7CM (CANNULA) ×2 IMPLANT
CHLORAPREP W/TINT 26 (MISCELLANEOUS) ×3 IMPLANT
COOLER POLAR GLACIER W/PUMP (MISCELLANEOUS) ×3 IMPLANT
COVER LIGHT HANDLE UNIVERSAL (MISCELLANEOUS) ×6 IMPLANT
DERMABOND ADVANCED (GAUZE/BANDAGES/DRESSINGS) ×1
DERMABOND ADVANCED .7 DNX12 (GAUZE/BANDAGES/DRESSINGS) ×2 IMPLANT
DRAPE INCISE IOBAN 66X45 STRL (DRAPES) ×3 IMPLANT
DRAPE U-SHAPE 48X52 POLY STRL (PACKS) ×3 IMPLANT
DRSG TEGADERM 4X4.75 (GAUZE/BANDAGES/DRESSINGS) ×9 IMPLANT
ELECT REM PT RETURN 9FT ADLT (ELECTROSURGICAL) ×3
ELECTRODE REM PT RTRN 9FT ADLT (ELECTROSURGICAL) ×2 IMPLANT
ETHIBOND 2 0 GREEN CT 2 30IN (SUTURE) ×2 IMPLANT
GAUZE SPONGE 4X4 12PLY STRL (GAUZE/BANDAGES/DRESSINGS) ×3 IMPLANT
GAUZE XEROFORM 1X8 LF (GAUZE/BANDAGES/DRESSINGS) ×3 IMPLANT
GLOVE SRG 8 PF TXTR STRL LF DI (GLOVE) ×6 IMPLANT
GLOVE SURG ENC MOIS LTX SZ7.5 (GLOVE) ×15 IMPLANT
GLOVE SURG UNDER POLY LF SZ8 (GLOVE) ×9
GOWN STRL REIN 2XL XLG LVL4 (GOWN DISPOSABLE) ×3 IMPLANT
GOWN STRL REUS W/ TWL LRG LVL3 (GOWN DISPOSABLE) ×6 IMPLANT
GOWN STRL REUS W/TWL LRG LVL3 (GOWN DISPOSABLE) ×9
IV LACTATED RINGER IRRG 3000ML (IV SOLUTION) ×30
IV LR IRRIG 3000ML ARTHROMATIC (IV SOLUTION) ×16 IMPLANT
KIT STABILIZATION SHOULDER (MISCELLANEOUS) ×3 IMPLANT
KIT TURNOVER KIT A (KITS) ×3 IMPLANT
MANIFOLD 4PT FOR NEPTUNE1 (MISCELLANEOUS) ×3 IMPLANT
MASK FACE SPIDER DISP (MASK) ×3 IMPLANT
MAT ABSORB  FLUID 56X50 GRAY (MISCELLANEOUS) ×2
MAT ABSORB FLUID 56X50 GRAY (MISCELLANEOUS) ×4 IMPLANT
PACK ARTHROSCOPY SHOULDER (MISCELLANEOUS) ×3 IMPLANT
PAD ABD DERMACEA PRESS 5X9 (GAUZE/BANDAGES/DRESSINGS) ×6 IMPLANT
PAD WRAPON POLAR SHDR XLG (MISCELLANEOUS) ×2 IMPLANT
PASSER SUT FIRSTPASS SELF (INSTRUMENTS) ×2 IMPLANT
SPONGE T-LAP 18X18 ~~LOC~~+RFID (SPONGE) IMPLANT
SUT ETHILON 3-0 FS-10 30 BLK (SUTURE) ×3
SUT PROLENE 4 0 PS 2 18 (SUTURE) ×2 IMPLANT
SUTURE EHLN 3-0 FS-10 30 BLK (SUTURE) ×1 IMPLANT
SYSTEM IMPL TENODESIS LNT 2.9 (Orthopedic Implant) ×2 IMPLANT
TAPE MICROFOAM 4IN (TAPE) ×3 IMPLANT
TUBING CONNECTING 10 (TUBING) ×3 IMPLANT
TUBING INFLOW SET DBFLO PUMP (TUBING) ×3 IMPLANT
TUBING OUTFLOW SET DBLFO PUMP (TUBING) ×3 IMPLANT
WAND WEREWOLF FLOW 90D (MISCELLANEOUS) ×3 IMPLANT
WRAPON POLAR PAD SHDR XLG (MISCELLANEOUS) ×3

## 2021-10-12 NOTE — H&P (Signed)
Paper H&P to be scanned into permanent record. H&P reviewed. No significant changes noted.  

## 2021-10-12 NOTE — Progress Notes (Signed)
Assisted Veda Canning, ANMD with right, ultrasound guided, interscalene  block. Side rails up, monitors on throughout procedure. See vital signs in flow sheet. Tolerated Procedure well. ?

## 2021-10-12 NOTE — Op Note (Signed)
SURGERY DATE: 10/12/2021 ?  ?PRE-OP DIAGNOSIS:  ?1. Right subacromial impingement ?2. Right biceps tendinopathy ?3. Right high-grade partial-thickness rotator cuff tear ?4. Right acromioclavicular joint arthritis ? ?POST-OP DIAGNOSIS: ?1. Right subacromial impingement ?2. Right biceps tendinopathy ?3. Right high-grade partial-thickness rotator cuff tear ?4. Right acromioclavicular joint arthritis ?  ?PROCEDURES:  ?1. Right arthroscopic rotator cuff repair ?2. Right arthroscopic biceps tenodesis ?3. Right arthroscopic subacromial decompression ?4. Right arthroscopic extensive debridement of shoulder (glenohumeral and subacromial spaces) ?5.  Right arthroscopic distal clavicle excision ?  ?SURGEON: Cato Mulligan, MD ?  ?ASSISTANT: none ?  ?ANESTHESIA: Gen with Exparel interscalene block ?  ?ESTIMATED BLOOD LOSS: 5cc ?  ?DRAINS:  none ?  ?TOTAL IV FLUIDS: per anesthesia    ?  ?SPECIMENS: none ?  ?IMPLANTS:  ?- Arthrex 2.65m PushLock x 1 ?- Arthrex 4.769mSwiveLock x 1 ?- Iconix SPEED double loaded with 1.2 and 2.51m62mape x 1 ?  ?  ?OPERATIVE FINDINGS:  ?Examination under anesthesia: A careful examination under anesthesia was performed.  Passive range of motion was: FF: 150; ER at side: 45; ER in abduction: 90; IR in abduction: 45.  Anterior load shift: NT.  Posterior load shift: NT.  Sulcus in neutral: NT.  Sulcus in ER: NT.   ?  ?Intra-operative findings: A thorough arthroscopic examination of the shoulder was performed.  The findings are: ?1. Biceps tendon: Partial-thickness tearing at the biceps anchor with significant erythema ?2. Superior labrum: erythema ?3. Posterior labrum and capsule: normal ?4. Inferior capsule and inferior recess: normal ?5. Glenoid cartilage surface: Normal ?6. Supraspinatus attachment: High-grade interstitial tear of the anterior supraspinatus with minimal intact fibers on the articular and bursal sides ?7. Posterior rotator cuff attachment: normal ?8. Humeral head articular cartilage:  normal except for grade 1-2 degenerative changes along the inferior humeral head ?9. Rotator interval: significant synovitis ?10: Subscapularis tendon: attachment intact ?11. Anterior labrum: Mildly degenerative ?12. IGHL: normal ?  ?OPERATIVE REPORT:  ?  ?Indications for procedure:  Eric [name] a 65 48o. male with over 2 years of right shoulder pain.  He had been managed conservatively with multiple corticosteroid injections and physical therapy. Clinical exam and MRI were suggestive of high-grade partial-thickness rotator cuff tear, biceps tendinopathy, AC joint arthritis, and subacromial impingement. After discussion of risks, benefits, and alternatives to surgery, the patient elected to proceed.  ?  ?Procedure in detail: ?  ?I identified WalJERRETT BALDINGER the pre-operative holding area.  I marked the operative shoulder with my initials. I reviewed the risks and benefits of the proposed surgical intervention, and the patient wished to proceed.  Anesthesia was then performed with an Exparel interscalene block.  The patient was transferred to the operative suite and placed in the beach chair position.   ?  ?Appropriate IV antibiotics were administered prior to incision. The operative upper extremity was then prepped and draped in standard fashion. A time out was performed confirming the correct extremity, correct patient, and correct procedure.  ?  ?I then created a standard posterior portal with an 11 blade. The glenohumeral joint was easily entered with a blunt trocar and the arthroscope introduced. The findings of diagnostic arthroscopy are described above. I debrided degenerative tissue including the synovitic tissue about the rotator interval and anterior and superior labrum. I then coagulated the inflamed synovium to obtain hemostasis and reduce the risk of post-operative swelling using an Arthrocare radiofrequency device.  The articular side of the rotator cuff was also debrided.  Tear on the  articular side involved approximately 10-20% of the footprint anteriorly.  This region was marked with a Prolene suture for later identification on the bursal side. ?  ?I then turned my attention to the arthroscopic biceps tenodesis. The Loop n Tack technique was used to pass a FiberTape through the biceps in a locked fashion adjacent to the biceps anchor.  A hole for a 2.9 mm Arthrex PushLock was drilled in the bicipital groove just superior to the subscapularis tendon insertion.  The biceps tendon was then cut and the biceps anchor complex was debrided down to a stable base on the superior labrum.  The FiberTape was loaded onto the PushLock anchor and impacted into place into the previously drilled hole in the bicipital groove.  This appropriately secured the biceps into the bicipital groove and took it off of tension. ?  ?Next, the arthroscope was then introduced into the subacromial space. A direct lateral portal was created with an 11-blade after spinal needle localization.  There was severe bursitis.  An extensive subacromial bursectomy and debridement was performed using a combination of the shaver and Arthrocare wand. The entire acromial undersurface was exposed and the CA ligament was subperiosteally elevated to expose the anterior acromial hook. A burr was used to create a flat anterior and lateral aspect of the acromion, converting it from a Type 2 to a Type 1 acromion. Care was made to keep the deltoid fascia intact. ?  ?I then turned my attention to the arthroscopic distal clavicle excision. I identified the acromioclavicular joint. Surrounding bursal tissue was debrided and the edges of the joint were identified. I used the 5.58m barrel burr to remove the distal clavicle parallel to the edge of the acromion. I was able to fit two widths of the burr into the space between the distal clavicle and acromion, signifying that I had removed ~177mof distal clavicle. This was confirmed by viewing anteriorly  and introducing a probe with measuring marks from the lateral portal. Hemostasis was achieved with an Arthrocare wand.  ? ?Next I created an accessory posterolateral portal to assist with visualization and instrumentation.  The previously passed Prolene suture was identified.  The rotator cuff was probed in this region and was extremely thin.  I was able to easily push through this region of the rotator cuff and enter the glenohumeral joint.  I debrided the poor quality edges of the supraspinatus tendon.  This was an L-shaped tear of the supraspinatus with long limb posterior.  I prepared the footprint using a burr to expose bleeding bone.  ?  ?I then percutaneously placed 1 Iconix SPEED medial row anchor at the articular margin. I then shuttled all 4 strands of tape through the rotator cuff just lateral to the musculotendinous junction using a FirstPass suture passer along the anterior aspect of the tear. All 4 strands of suture were passed through an [employer] Incnchor.  This was placed approximately 1 cm distal to the lateral edge of the footprint posterior to the tear in order to allow for an anterior to posterior portal to better reduce the L- shaped tear.  The knotless mechanism of the SwiveLock anchor was utilized to further tension the midportion of the tear. This construct allowed for excellent reapproximation of the rotator cuff to its native footprint without undue tension.  Appropriate compression was achieved.  The repair was stable to external and internal rotation. ?  ?Fluid was evacuated from the shoulder, and the portals were closed  with 3-0 Nylon. Xeroform was applied to the portals. A sterile dressing was applied, followed by a Polar Care sleeve and a SlingShot shoulder immobilizer/sling. The patient was awakened from anesthesia without difficulty and was transferred to the PACU in stable condition.  ? ? ?COMPLICATIONS: none ?  ?DISPOSITION: plan for discharge home after recovery in PACU ?   ?  ?POSTOPERATIVE PLAN: Remain in sling (except hygiene and elbow/wrist/hand RoM exercises as instructed by PT) x 4 weeks and NWB for this time. PT to begin 3-4 days after surgery.  Small/medium rotator cuff

## 2021-10-12 NOTE — Anesthesia Procedure Notes (Signed)
Anesthesia Regional Block: Interscalene brachial plexus block  ? ?Pre-Anesthetic Checklist: , timeout performed,  Correct Patient, Correct Site, Correct Laterality,  Correct Procedure, Correct Position, site marked,  Risks and benefits discussed,  Surgical consent,  Pre-op evaluation,  At surgeon's request and post-op pain management ? ?Laterality: Right ? ?Prep: chloraprep     ?  ?Needles:  ?Injection technique: Single-shot ? ?Needle Type: Stimiplex   ? ? ?Needle Length: 10cm  ?Needle Gauge: 21  ? ? ? ?Additional Needles: ? ? ?Procedures:,,,, ultrasound used (permanent image in chart),,    ?Narrative:  ?Start time: 10/12/2021 11:51 AM ?End time: 10/12/2021 11:55 AM ?Injection made incrementally with aspirations every 5 mL. ? ?Performed by: Personally  ? ?Additional Notes: ?Functioning IV was confirmed and monitors applied. Ultrasound guidance: relevant anatomy identified, needle position confirmed, local anesthetic spread visualized around nerve(s)., vascular puncture avoided.  Image printed for medical record.  Negative aspiration and no paresthesias; incremental administration of local anesthetic. The patient tolerated the procedure well. Vitals signs recorded in RN notes. ? ? ? ?

## 2021-10-12 NOTE — Anesthesia Postprocedure Evaluation (Signed)
Anesthesia Post Note ? ?Patient: Eric [name] ? ?Procedure(s) Performed: ARTHROSCOPIC ROTATOR CUFF REPAIR (Right: Shoulder) ?BICEPS TENODESIS (Right: Shoulder) ?SUBACROMIAL DECOMPRESSION (Right: Shoulder) ?RESECTION DISTAL CLAVICAL (Right: Shoulder) ? ? ?  ?Patient location during evaluation: PACU ?Anesthesia Type: Regional and General ?Level of consciousness: awake ?Pain management: pain level controlled ?Vital Signs Assessment: post-procedure vital signs reviewed and stable ?Respiratory status: respiratory function stable ?Cardiovascular status: stable ?Postop Assessment: no signs of nausea or vomiting ?Anesthetic complications: no ? ? ?No notable events documented. ? ?Veda Canning ? ? ? ? ? ?

## 2021-10-12 NOTE — Transfer of Care (Signed)
Immediate Anesthesia Transfer of Care Note ? ?Patient: Eric [name] ? ?Procedure(s) Performed: Right shoulder arthroscopic rotator cuff repair vs Regeneten Patch application, subacromial decompression, distal clavicle excision, and biceps tenodesis (Right) ? ?Patient Location: PACU ? ?Anesthesia Type: General, Regional ? ?Level of Consciousness: awake, alert  and patient cooperative ? ?Airway and Oxygen Therapy: Patient Spontanous Breathing and Patient connected to supplemental oxygen ? ?Post-op Assessment: Post-op Vital signs reviewed, Patient's Cardiovascular Status Stable, Respiratory Function Stable, Patent Airway and No signs of Nausea or vomiting ? ?Post-op Vital Signs: Reviewed and stable ? ?Complications: No notable events documented. ? ?

## 2021-10-12 NOTE — Anesthesia Preprocedure Evaluation (Signed)
Anesthesia Evaluation  ?Patient identified by MRN, date of birth, ID band ?Patient awake ? ? ? ?Reviewed: ?Allergy & Precautions, NPO status  ? ?Airway ?Mallampati: II ? ?TM Distance: >3 FB ? ? ? ? Dental ?  ?Pulmonary ?Patient abstained from smoking., former smoker,  ?  ?Pulmonary exam normal ? ? ? ? ? ? ? Cardiovascular ?hypertension, + CAD and + Cardiac Stents (x 2)  ? ?Rhythm:Regular Rate:Normal ? ?HLD ?  ?Neuro/Psych ?  ? GI/Hepatic ?GERD  ,  ?Endo/Other  ?diabetes, Type 2Obesity - BMI > 35 ? Renal/GU ?  ? ?  ?Musculoskeletal ? ?(+) Arthritis ,  ? Abdominal ?  ?Peds ? Hematology ?  ?Anesthesia Other Findings ? ? Reproductive/Obstetrics ? ?  ? ? ? ? ? ? ? ? ? ? ? ? ? ?  ?  ? ? ? ? ? ? ? ? ?Anesthesia Physical ?Anesthesia Plan ? ?ASA: 3 ? ?Anesthesia Plan: General and Regional  ? ?Post-op Pain Management: Regional block  ? ?Induction: Intravenous ? ?PONV Risk Score and Plan: 2 and Treatment may vary due to age or medical condition, Ondansetron, Dexamethasone and Midazolam ? ?Airway Management Planned: LMA ? ?Additional Equipment:  ? ?Intra-op Plan:  ? ?Post-operative Plan:  ? ?Informed Consent: I have reviewed the patients History and Physical, chart, labs and discussed the procedure including the risks, benefits and alternatives for the proposed anesthesia with the patient or authorized representative who has indicated his/her understanding and acceptance.  ? ? ? ?Dental advisory given ? ?Plan Discussed with: CRNA ? ?Anesthesia Plan Comments:   ? ? ? ? ? ? ?Anesthesia Quick Evaluation ? ?

## 2021-10-12 NOTE — Anesthesia Procedure Notes (Signed)
Procedure Name: LMA Insertion ?Date/Time: 10/12/2021 12:51 PM ?Performed by: Mayme Genta, CRNA ?Pre-anesthesia Checklist: Patient identified, Emergency Drugs available, Suction available, Timeout performed and Patient being monitored ?Patient Re-evaluated:Patient Re-evaluated prior to induction ?Oxygen Delivery Method: Circle system utilized ?Preoxygenation: Pre-oxygenation with 100% oxygen ?Induction Type: IV induction ?LMA: LMA inserted ?LMA Size: 4.0 ?Number of attempts: 1 ?Placement Confirmation: positive ETCO2 and breath sounds checked- equal and bilateral ?Tube secured with: Tape ? ? ? ? ?

## 2021-10-13 ENCOUNTER — Encounter: Payer: Self-pay | Admitting: Orthopedic Surgery

## 2021-10-18 ENCOUNTER — Other Ambulatory Visit: Payer: Self-pay | Admitting: Cardiovascular Disease

## 2021-10-18 DIAGNOSIS — M6281 Muscle weakness (generalized): Secondary | ICD-10-CM | POA: Diagnosis not present

## 2021-10-18 DIAGNOSIS — M25611 Stiffness of right shoulder, not elsewhere classified: Secondary | ICD-10-CM | POA: Diagnosis not present

## 2021-10-18 DIAGNOSIS — Z9889 Other specified postprocedural states: Secondary | ICD-10-CM | POA: Diagnosis not present

## 2021-10-18 DIAGNOSIS — M25511 Pain in right shoulder: Secondary | ICD-10-CM | POA: Diagnosis not present

## 2021-10-21 DIAGNOSIS — K120 Recurrent oral aphthae: Secondary | ICD-10-CM | POA: Diagnosis not present

## 2021-10-21 DIAGNOSIS — R070 Pain in throat: Secondary | ICD-10-CM | POA: Diagnosis not present

## 2021-10-26 DIAGNOSIS — H40003 Preglaucoma, unspecified, bilateral: Secondary | ICD-10-CM | POA: Diagnosis not present

## 2021-10-27 DIAGNOSIS — M6281 Muscle weakness (generalized): Secondary | ICD-10-CM | POA: Diagnosis not present

## 2021-10-27 DIAGNOSIS — Z9889 Other specified postprocedural states: Secondary | ICD-10-CM | POA: Diagnosis not present

## 2021-10-27 DIAGNOSIS — M25611 Stiffness of right shoulder, not elsewhere classified: Secondary | ICD-10-CM | POA: Diagnosis not present

## 2021-10-27 DIAGNOSIS — M25511 Pain in right shoulder: Secondary | ICD-10-CM | POA: Diagnosis not present

## 2021-11-01 ENCOUNTER — Telehealth: Payer: Self-pay | Admitting: Cardiovascular Disease

## 2021-11-01 ENCOUNTER — Other Ambulatory Visit: Payer: Self-pay | Admitting: *Deleted

## 2021-11-01 MED ORDER — FEXOFENADINE HCL 180 MG PO TABS
180.0000 mg | ORAL_TABLET | Freq: Every day | ORAL | 2 refills | Status: DC
Start: 2021-11-01 — End: 2021-11-01

## 2021-11-01 MED ORDER — FEXOFENADINE HCL 180 MG PO TABS: 180.0000 mg | ORAL_TABLET | Freq: Every day | ORAL | 3 refills | Status: DC

## 2021-11-01 NOTE — Telephone Encounter (Signed)
Requested Prescriptions  ? ?Signed Prescriptions Disp Refills  ? fexofenadine (ALLEGRA) 180 MG tablet 90 tablet 2  ?  Sig: Take 1 tablet (180 mg total) by mouth daily.  ?  Authorizing Provider: Minna Merritts  ?  Ordering User: Othelia Pulling C  ? ? ?

## 2021-11-01 NOTE — Telephone Encounter (Signed)
?*  STAT* If patient is at the pharmacy, call can be transferred to refill team. ? ? ?1. Which medications need to be refilled? (please list name of each medication and dose if known) Fexofenadine '1800mg'$  1 tablet daily  ? ?2. Which pharmacy/location (including street and city if local pharmacy) is medication to be sent to? [facility] ? ?3. Do they need a 30 day or 90 day supply? 90 day supply ?

## 2021-11-02 DIAGNOSIS — H40003 Preglaucoma, unspecified, bilateral: Secondary | ICD-10-CM | POA: Diagnosis not present

## 2021-11-02 DIAGNOSIS — E119 Type 2 diabetes mellitus without complications: Secondary | ICD-10-CM | POA: Diagnosis not present

## 2021-11-02 DIAGNOSIS — H2513 Age-related nuclear cataract, bilateral: Secondary | ICD-10-CM | POA: Diagnosis not present

## 2021-11-02 LAB — HM DIABETES EYE EXAM

## 2021-11-03 DIAGNOSIS — M6281 Muscle weakness (generalized): Secondary | ICD-10-CM | POA: Diagnosis not present

## 2021-11-03 DIAGNOSIS — Z9889 Other specified postprocedural states: Secondary | ICD-10-CM | POA: Diagnosis not present

## 2021-11-03 DIAGNOSIS — M25611 Stiffness of right shoulder, not elsewhere classified: Secondary | ICD-10-CM | POA: Diagnosis not present

## 2021-11-03 DIAGNOSIS — M25511 Pain in right shoulder: Secondary | ICD-10-CM | POA: Diagnosis not present

## 2021-11-09 DIAGNOSIS — Z9889 Other specified postprocedural states: Secondary | ICD-10-CM | POA: Diagnosis not present

## 2021-11-09 DIAGNOSIS — M25611 Stiffness of right shoulder, not elsewhere classified: Secondary | ICD-10-CM | POA: Diagnosis not present

## 2021-11-09 DIAGNOSIS — M25511 Pain in right shoulder: Secondary | ICD-10-CM | POA: Diagnosis not present

## 2021-11-09 DIAGNOSIS — M6281 Muscle weakness (generalized): Secondary | ICD-10-CM | POA: Diagnosis not present

## 2021-11-16 ENCOUNTER — Ambulatory Visit (INDEPENDENT_AMBULATORY_CARE_PROVIDER_SITE_OTHER): Payer: BC Managed Care – PPO | Admitting: Family Medicine

## 2021-11-16 ENCOUNTER — Encounter: Payer: Self-pay | Admitting: Family Medicine

## 2021-11-16 VITALS — BP 128/76 | HR 74 | Temp 97.9°F | Ht 68.5 in | Wt 243.0 lb

## 2021-11-16 DIAGNOSIS — I10 Essential (primary) hypertension: Secondary | ICD-10-CM

## 2021-11-16 DIAGNOSIS — M25512 Pain in left shoulder: Secondary | ICD-10-CM

## 2021-11-16 DIAGNOSIS — C44520 Squamous cell carcinoma of anal skin: Secondary | ICD-10-CM

## 2021-11-16 DIAGNOSIS — Z0000 Encounter for general adult medical examination without abnormal findings: Secondary | ICD-10-CM | POA: Diagnosis not present

## 2021-11-16 DIAGNOSIS — Z125 Encounter for screening for malignant neoplasm of prostate: Secondary | ICD-10-CM

## 2021-11-16 DIAGNOSIS — M25511 Pain in right shoulder: Secondary | ICD-10-CM

## 2021-11-16 DIAGNOSIS — K760 Fatty (change of) liver, not elsewhere classified: Secondary | ICD-10-CM | POA: Diagnosis not present

## 2021-11-16 DIAGNOSIS — E785 Hyperlipidemia, unspecified: Secondary | ICD-10-CM

## 2021-11-16 DIAGNOSIS — G8929 Other chronic pain: Secondary | ICD-10-CM

## 2021-11-16 DIAGNOSIS — E1169 Type 2 diabetes mellitus with other specified complication: Secondary | ICD-10-CM

## 2021-11-16 DIAGNOSIS — Z87891 Personal history of nicotine dependence: Secondary | ICD-10-CM

## 2021-11-16 DIAGNOSIS — I2510 Atherosclerotic heart disease of native coronary artery without angina pectoris: Secondary | ICD-10-CM

## 2021-11-16 LAB — COMPREHENSIVE METABOLIC PANEL
ALT: 43 U/L (ref 0–53)
AST: 34 U/L (ref 0–37)
Albumin: 4.6 g/dL (ref 3.5–5.2)
Alkaline Phosphatase: 56 U/L (ref 39–117)
BUN: 11 mg/dL (ref 6–23)
CO2: 30 mEq/L (ref 19–32)
Calcium: 9.7 mg/dL (ref 8.4–10.5)
Chloride: 98 mEq/L (ref 96–112)
Creatinine, Ser: 0.82 mg/dL (ref 0.40–1.50)
GFR: 92.85 mL/min (ref >60.00)
Glucose, Bld: 121 mg/dL — ABNORMAL HIGH (ref 70–99)
Potassium: 4.5 mEq/L (ref 3.5–5.1)
Sodium: 136 mEq/L (ref 135–145)
Total Bilirubin: 0.6 mg/dL (ref 0.2–1.2)
Total Protein: 7.5 g/dL (ref 6.0–8.3)

## 2021-11-16 LAB — MICROALBUMIN / CREATININE URINE RATIO
Creatinine,U: 42.7 mg/dL
Microalb Creat Ratio: 1.8 mg/g (ref 0.0–30.0)
Microalb, Ur: 0.8 mg/dL (ref 0.0–1.9)

## 2021-11-16 LAB — CBC WITH DIFFERENTIAL/PLATELET
Basophils Absolute: 0.1 10*3/uL (ref 0.0–0.1)
Basophils Relative: 1 % (ref 0.0–3.0)
Eosinophils Absolute: 0.3 10*3/uL (ref 0.0–0.7)
Eosinophils Relative: 3.8 % (ref 0.0–5.0)
HCT: 47.8 % (ref 39.0–52.0)
Hemoglobin: 15.7 g/dL (ref 13.0–17.0)
Lymphocytes Relative: 21.1 % (ref 12.0–46.0)
Lymphs Abs: 1.6 10*3/uL (ref 0.7–4.0)
MCHC: 32.9 g/dL (ref 30.0–36.0)
MCV: 87.3 fl (ref 78.0–100.0)
Monocytes Absolute: 0.8 10*3/uL (ref 0.1–1.0)
Monocytes Relative: 10.3 % (ref 3.0–12.0)
Neutro Abs: 5 10*3/uL (ref 1.4–7.7)
Neutrophils Relative %: 63.8 % (ref 43.0–77.0)
Platelets: 212 10*3/uL (ref 150.0–400.0)
RBC: 5.48 Mil/uL (ref 4.22–5.81)
RDW: 14.7 % (ref 11.5–15.5)
WBC: 7.8 10*3/uL (ref 4.0–10.5)

## 2021-11-16 LAB — PSA: PSA: 0.95 ng/mL (ref 0.10–4.00)

## 2021-11-16 LAB — LIPID PANEL
Cholesterol: 103 mg/dL (ref 0–200)
HDL: 36 mg/dL — ABNORMAL LOW (ref >39.00)
LDL Cholesterol: 44 mg/dL (ref 0–99)
NonHDL: 67
Total CHOL/HDL Ratio: 3
Triglycerides: 116 mg/dL (ref 0.0–149.0)
VLDL: 23.2 mg/dL (ref 0.0–40.0)

## 2021-11-16 LAB — HEMOGLOBIN A1C: Hgb A1c MFr Bld: 7.2 % — ABNORMAL HIGH (ref 4.6–6.5)

## 2021-11-16 MED ORDER — OZEMPIC (0.25 OR 0.5 MG/DOSE) 2 MG/1.5ML ~~LOC~~ SOPN: PEN_INJECTOR | SUBCUTANEOUS | 3 refills | Status: AC

## 2021-11-16 NOTE — Assessment & Plan Note (Signed)
Chronic, stable on current regimen - continue. 

## 2021-11-16 NOTE — Assessment & Plan Note (Signed)
Chronic, continues crestor. Update FLP. ?The ASCVD Risk score (Arnett DK, et al., 2019) failed to calculate for the following reasons: ?  The valid total cholesterol range is 130 to 320 mg/dL  ?

## 2021-11-16 NOTE — Patient Instructions (Addendum)
Labs today  ?Call us with date of your bivalent COVID booster and we will update your chart.  ?You are doing well today.  ?Price out Ozempic. Would start at 0.'25mg'$  weekly for 2 weeks then increase to 0.'5mg'$  weekly. If you start, drop metformin to 2 tablets in the morning.  ?Return in 6 months for follow up visit.  ? ?Health Maintenance, Male ?Adopting a healthy lifestyle and getting preventive care are important in promoting health and wellness. Ask your health care provider about: ?The right schedule for you to have regular tests and exams. ?Things you can do on your own to prevent diseases and keep yourself healthy. ?What should I know about diet, weight, and exercise? ?Eat a healthy diet ? ?Eat a diet that includes plenty of vegetables, fruits, low-fat dairy products, and lean protein. ?Do not eat a lot of foods that are high in solid fats, added sugars, or sodium. ?Maintain a healthy weight ?Body mass index (BMI) is a measurement that can be used to identify possible weight problems. It estimates body fat based on height and weight. Your health care provider can help determine your BMI and help you achieve or maintain a healthy weight. ?Get regular exercise ?Get regular exercise. This is one of the most important things you can do for your health. Most adults should: ?Exercise for at least 150 minutes each week. The exercise should increase your heart rate and make you sweat (moderate-intensity exercise). ?Do strengthening exercises at least twice a week. This is in addition to the moderate-intensity exercise. ?Spend less time sitting. Even light physical activity can be beneficial. ?Watch cholesterol and blood lipids ?Have your blood tested for lipids and cholesterol at 65 years of age, then have this test every 5 years. ?You may need to have your cholesterol levels checked more often if: ?Your lipid or cholesterol levels are high. ?You are older than 65 years of age. ?You are at high risk for heart  disease. ?What should I know about cancer screening? ?Many types of cancers can be detected early and may often be prevented. Depending on your health history and family history, you may need to have cancer screening at various ages. This may include screening for: ?Colorectal cancer. ?Prostate cancer. ?Skin cancer. ?Lung cancer. ?What should I know about heart disease, diabetes, and high blood pressure? ?Blood pressure and heart disease ?High blood pressure causes heart disease and increases the risk of stroke. This is more likely to develop in people who have high blood pressure readings or are overweight. ?Talk with your health care provider about your target blood pressure readings. ?Have your blood pressure checked: ?Every 3-5 years if you are 86-70 years of age. ?Every year if you are 81 years old or older. ?If you are between the ages of 6 and 4 and are a current or former smoker, ask your health care provider if you should have a one-time screening for abdominal aortic aneurysm (AAA). ?Diabetes ?Have regular diabetes screenings. This checks your fasting blood sugar level. Have the screening done: ?Once every three years after age 29 if you are at a normal weight and have a low risk for diabetes. ?More often and at a younger age if you are overweight or have a high risk for diabetes. ?What should I know about preventing infection? ?Hepatitis B ?If you have a higher risk for hepatitis B, you should be screened for this virus. Talk with your health care provider to find out if you are at risk for  hepatitis B infection. ?Hepatitis C ?Blood testing is recommended for: ?Everyone born from [birth year] through 1964. ?Anyone with known risk factors for hepatitis C. ?Sexually transmitted infections (STIs) ?You should be screened each year for STIs, including gonorrhea and chlamydia, if: ?You are sexually active and are younger than 65 years of age. ?You are older than 65 years of age and your health care provider tells you  that you are at risk for this type of infection. ?Your sexual activity has changed since you were last screened, and you are at increased risk for chlamydia or gonorrhea. Ask your health care provider if you are at risk. ?Ask your health care provider about whether you are at high risk for HIV. Your health care provider may recommend a prescription medicine to help prevent HIV infection. If you choose to take medicine to prevent HIV, you should first get tested for HIV. You should then be tested every 3 months for as long as you are taking the medicine. ?Follow these instructions at home: ?Alcohol use ?Do not drink alcohol if your health care provider tells you not to drink. ?If you drink alcohol: ?Limit how much you have to 0-2 drinks a day. ?Know how much alcohol is in your drink. In the [place] one drink equals one 12 oz bottle of beer (355 mL), one 5 oz glass of wine (148 mL), or one 1? oz glass of hard liquor (44 mL). ?Lifestyle ?Do not use any products that contain nicotine or tobacco. These products include cigarettes, chewing tobacco, and vaping devices, such as e-cigarettes. If you need help quitting, ask your health care provider. ?Do not use street drugs. ?Do not share needles. ?Ask your health care provider for help if you need support or information about quitting drugs. ?General instructions ?Schedule regular health, dental, and eye exams. ?Stay current with your vaccines. ?Tell your health care provider if: ?You often feel depressed. ?You have ever been abused or do not feel safe at home. ?Summary ?Adopting a healthy lifestyle and getting preventive care are important in promoting health and wellness. ?Follow your health care provider's instructions about healthy diet, exercising, and getting tested or screened for diseases. ?Follow your health care provider's instructions on monitoring your cholesterol and blood pressure. ?This information is not intended to replace advice given to you by your health  care provider. Make sure you discuss any questions you have with your health care provider. ?Document Revised: 11/30/2020 Document Reviewed: 11/30/2020 ?Elsevier Patient Education ? [place]. ? ?

## 2021-11-16 NOTE — Assessment & Plan Note (Signed)
Congratulated on weight loss to date  - he is motivated to continue healthy diet and lifestyle choices to affect sustainable weight loss.  ?

## 2021-11-16 NOTE — Assessment & Plan Note (Addendum)
Recent R RTC repair/biceps tenodesis.  ?Planning to schedule L side.  ?Continues seeing ortho.  ?

## 2021-11-16 NOTE — Assessment & Plan Note (Signed)
Discussed with patient. Will monitor.  ?

## 2021-11-16 NOTE — Assessment & Plan Note (Signed)
Update A1c. Discussed weekly GLP1RA along with cardiovascular benefits. Will send in ozempic for pt to price out. Discussed dropping metformin dose if ozempic started.  ?

## 2021-11-16 NOTE — Assessment & Plan Note (Signed)
Preventative protocols reviewed and updated unless pt declined. Discussed healthy diet and lifestyle.  

## 2021-11-16 NOTE — Progress Notes (Signed)
? ? Patient ID: Eric [name], male    DOB: 04-08-1956, 66 y.o.   MRN: [id number] ? ?This visit was conducted in person. ? ?BP 128/76   Pulse 74   Temp 97.9 ?F (36.6 ?C) (Temporal)   Ht 5' 8.5" (1.74 m)   Wt 243 lb (110.2 kg)   SpO2 95%   BMI 36.41 kg/m?   ? ?CC: CPE ?Subjective:  ? ?HPI: ?Eric [name] is a 66 y.o. male presenting on 11/16/2021 for Annual Exam ? ? ?Retired, continues driving part time.  ? ?Recent repeat excisional surgery by Dr Bary Castilla for in situ SCC arising within perianal SK, second excision done with clear margins, complicated by wound dehiscence. Wound finally healed. Recommended yearly skin exams by dermatology. He already sees derm Dr Nehemiah Massed.  ? ?Stress test last year - reassuring ([facility]).  ? ?s\p R RTC repair and biceps tenodesis 09/2021 ([facility]).  ? ?Saw ENT Tami Ribas) with reassuring evaluation.  ? ?13 lb weight loss since limiting carbs.  ? ?Preventative: ?COLONOSCOPY WITH PROPOFOL; 03/14/2016; TAx1, diverticulosis, rpt 5 yrs; Lollie Sails, MD - most recently told rpt 7 yrs - planning to f/u with [facility] (Gen surgery).  ?Prostate cancer screening - yearly PSA ?Lung cancer screening - first lung CT done 09/2021 - rec 6 mo short interval f/u for RLL pulm nodule. Also found fatty liver and known ATH plaque, CAD.  ?Flu shot yearly  ?COVID vaccine Moderna 08/2019, 09/2019, booster 06/2020, 2020/12/20, bivalent  ?Pneumovax 03/10/2014 ([facility])  ?Tetanus 2007, Tdap 2016 ?zostavax 07/2013  ?shingrix - 07/2017, 10/2017  ?Seat belt use discussed  ?Sunscreen use discussed, no changing moles on skin ?Ex smoker - quit 03/2007. Smoked 1ppd for 20 yrs. ~20 PY hx ?Alcohol - averages 5-10 beers/wk. Limited.  ?Dentist Q6 mo  ?Eye exam Q6 mo - monitoring glaucoma  ? ?Lives alone - brother died of suicide 12/21/19  ?Occupation: truck Geophysicist/field seismologist, stocks stores  ?Activity: started walking  ?Diet: good water, fruits/vegetables some - healthier eating choices  ?   ? ?Relevant past medical, surgical,  family and social history reviewed and updated as indicated. Interim medical history since our last visit reviewed. ?Allergies and medications reviewed and updated. ?Outpatient Medications Prior to Visit  ?Medication Sig Dispense Refill  ? acetaminophen (TYLENOL) 500 MG tablet Take 2 tablets (1,000 mg total) by mouth every 8 (eight) hours. 90 tablet 2  ? Ascorbic Acid (VITAMIN C) 1000 MG tablet Take 1,000 mg by mouth every evening.     ? aspirin EC 81 MG tablet Take 162 mg by mouth daily.  90 tablet 3  ? cholecalciferol (VITAMIN D3) 25 MCG (1000 UT) tablet Take 1,000 Units by mouth every evening.     ? ciprofloxacin (CIPRO) 500 MG tablet Take 500 mg by mouth 2 (two) times daily as needed (Diverticulitis).    ? clopidogrel (PLAVIX) 75 MG tablet TAKE 1 TABLET(75 MG) BY MOUTH DAILY 90 tablet 2  ? Coenzyme Q10 200 MG capsule Take 200 mg by mouth daily in the afternoon.     ? cyclobenzaprine (FLEXERIL) 10 MG tablet Take 1 tablet (10 mg total) by mouth 3 (three) times daily as needed for muscle spasms. 30 tablet 1  ? diazepam (VALIUM) 5 MG tablet Take 1 tablet (5 mg total) by mouth every 12 (twelve) hours as needed for anxiety or sedation (sleep). 30 tablet 0  ? dicyclomine (BENTYL) 20 MG tablet Take 20 mg by mouth every 6 (six) hours as needed for  spasms.    ? diphenhydrAMINE (BENADRYL) 25 MG tablet Take 25 mg by mouth every 6 (six) hours as needed for allergies.     ? docusate sodium (COLACE) 100 MG capsule Take 100 mg by mouth daily.    ? EPINEPHrine 0.3 mg/0.3 mL IJ SOAJ injection INJECT INTRAMUSCULARY AS DIRECTED 4 each 0  ? famotidine (PEPCID) 20 MG tablet TAKE 1 TABLET(20 MG) BY MOUTH TWICE DAILY 180 tablet 2  ? fexofenadine (ALLEGRA) 180 MG tablet Take 1 tablet (180 mg total) by mouth daily. 90 tablet 3  ? hydrochlorothiazide (HYDRODIURIL) 25 MG tablet TAKE 1 TABLET BY MOUTH DAILY 90 tablet 2  ? hydrocortisone 2.5 % cream Apply topically 2 (two) times daily as needed.    ? losartan (COZAAR) 100 MG tablet TAKE 1  TABLET(100 MG) BY MOUTH DAILY 90 tablet 2  ? metFORMIN (GLUCOPHAGE) 500 MG tablet Take 3 tablets (1,500 mg total) by mouth daily with breakfast. 270 tablet 3  ? Methylcellulose, Laxative, (CITRUCEL) 500 MG TABS Take 1,000 mg by mouth at bedtime.    ? metroNIDAZOLE (FLAGYL) 500 MG tablet Take 500 mg by mouth 3 (three) times daily as needed (Diverticulitis).    ? Multiple Vitamin (MULTIVITAMIN WITH MINERALS) TABS tablet Take 1 tablet by mouth every evening.    ? nitroGLYCERIN (NITROSTAT) 0.4 MG SL tablet Place 1 tablet (0.4 mg total) under the tongue every 5 (five) minutes x 3 doses as needed for chest pain. PLACE 1 TABLET UNDER THE TONGUE IF NEEDED EVERY 5 MINUTES FOR CHEST PAIN FOR 3 DOSES IF NO RELIEF AFER FIRST DOSE CALL PRESCRIBER OR 911 25 tablet 3  ? Omega-3 Fatty Acids (FISH OIL CONCENTRATE) 1000 MG CAPS Take 3,000 mg by mouth every evening.     ? omeprazole (PRILOSEC) 40 MG capsule TAKE 1 CAPSULE BY MOUTH DAILY 90 capsule 2  ? ondansetron (ZOFRAN-ODT) 4 MG disintegrating tablet Take 1 tablet (4 mg total) by mouth every 8 (eight) hours as needed for nausea or vomiting. 20 tablet 0  ? oxyCODONE (ROXICODONE) 5 MG immediate release tablet Take 1-2 tablets (5-10 mg total) by mouth every 4 (four) hours as needed (pain). 30 tablet 0  ? propranolol (INDERAL) 20 MG tablet Take 1 tablet (20 mg total) by mouth 3 (three) times daily as needed (tachycardia). 30 tablet 3  ? rosuvastatin (CRESTOR) 40 MG tablet TAKE 1 TABLET(40 MG) BY MOUTH DAILY 90 tablet 2  ? sildenafil (VIAGRA) 100 MG tablet Take 0.5-1 tablets (50-100 mg total) by mouth daily as needed for erectile dysfunction. 20 tablet 3  ? tadalafil (CIALIS) 20 MG tablet Take 1 tablet (20 mg total) by mouth daily as needed for erectile dysfunction. 30 tablet 1  ? traZODone (DESYREL) 50 MG tablet Take 0.5-1 tablets (25-50 mg total) by mouth at bedtime as needed for sleep. 90 tablet 3  ? TURMERIC PO Take 1,000 mg by mouth in the morning and at bedtime.    ? ?No  facility-administered medications prior to visit.  ?  ? ?Per HPI unless specifically indicated in ROS section below ?Review of Systems  ?Constitutional:  Negative for activity change, appetite change, chills, fatigue, fever and unexpected weight change.  ?HENT:  Negative for hearing loss.   ?Eyes:  Negative for visual disturbance.  ?Respiratory:  Negative for cough, chest tightness, shortness of breath and wheezing.   ?Cardiovascular:  Positive for palpitations (isolated episode). Negative for chest pain and leg swelling.  ?Gastrointestinal:  Positive for constipation (occ). Negative for abdominal  distention, abdominal pain, blood in stool, diarrhea, nausea and vomiting.  ?Genitourinary:  Negative for difficulty urinating and hematuria.  ?Musculoskeletal:  Negative for arthralgias, myalgias and neck pain.  ?Skin:  Negative for rash.  ?Neurological:  Negative for dizziness, seizures, syncope and headaches.  ?Hematological:  Negative for adenopathy. Does not bruise/bleed easily.  ?Psychiatric/Behavioral:  Negative for dysphoric mood. The patient is not nervous/anxious.   ? ?Objective:  ?BP 128/76   Pulse 74   Temp 97.9 ?F (36.6 ?C) (Temporal)   Ht 5' 8.5" (1.74 m)   Wt 243 lb (110.2 kg)   SpO2 95%   BMI 36.41 kg/m?   ?Wt Readings from Last 3 Encounters:  ?11/16/21 243 lb (110.2 kg)  ?10/12/21 243 lb (110.2 kg)  ?09/28/21 256 lb (116.1 kg)  ?  ?  ?Physical Exam ?Vitals and nursing note reviewed.  ?Constitutional:   ?   General: He is not in acute distress. ?   Appearance: Normal appearance. He is well-developed. He is obese. He is not ill-appearing.  ?HENT:  ?   Head: Normocephalic and atraumatic.  ?   Right Ear: Hearing, tympanic membrane, ear canal and external ear normal.  ?   Left Ear: Hearing, tympanic membrane, ear canal and external ear normal.  ?Eyes:  ?   General: No scleral icterus. ?   Extraocular Movements: Extraocular movements intact.  ?   Conjunctiva/sclera: Conjunctivae normal.  ?   Pupils: Pupils  are equal, round, and reactive to light.  ?Neck:  ?   Thyroid: No thyroid mass or thyromegaly.  ?   Vascular: No carotid bruit.  ?Cardiovascular:  ?   Rate and Rhythm: Normal rate and regular rhythm.  ?   Pulses: Normal puls

## 2021-11-16 NOTE — Assessment & Plan Note (Signed)
He's established with lung cancer screening program.  ?

## 2021-11-16 NOTE — Assessment & Plan Note (Signed)
Encouraged yearly derm f/u.  ?

## 2021-11-16 NOTE — Assessment & Plan Note (Signed)
Appreciate cards care - continue aspirin, plavix, statin.  ?

## 2021-11-17 DIAGNOSIS — M25511 Pain in right shoulder: Secondary | ICD-10-CM | POA: Diagnosis not present

## 2021-11-17 DIAGNOSIS — M6281 Muscle weakness (generalized): Secondary | ICD-10-CM | POA: Diagnosis not present

## 2021-11-17 DIAGNOSIS — Z9889 Other specified postprocedural states: Secondary | ICD-10-CM | POA: Diagnosis not present

## 2021-11-17 DIAGNOSIS — M25611 Stiffness of right shoulder, not elsewhere classified: Secondary | ICD-10-CM | POA: Diagnosis not present

## 2021-11-23 DIAGNOSIS — Z9889 Other specified postprocedural states: Secondary | ICD-10-CM | POA: Diagnosis not present

## 2021-11-23 DIAGNOSIS — M25511 Pain in right shoulder: Secondary | ICD-10-CM | POA: Diagnosis not present

## 2021-11-23 DIAGNOSIS — M25611 Stiffness of right shoulder, not elsewhere classified: Secondary | ICD-10-CM | POA: Diagnosis not present

## 2021-11-23 DIAGNOSIS — M6281 Muscle weakness (generalized): Secondary | ICD-10-CM | POA: Diagnosis not present

## 2021-11-25 DIAGNOSIS — Z9889 Other specified postprocedural states: Secondary | ICD-10-CM | POA: Diagnosis not present

## 2021-11-25 DIAGNOSIS — M6281 Muscle weakness (generalized): Secondary | ICD-10-CM | POA: Diagnosis not present

## 2021-11-25 DIAGNOSIS — M25511 Pain in right shoulder: Secondary | ICD-10-CM | POA: Diagnosis not present

## 2021-11-25 DIAGNOSIS — M25611 Stiffness of right shoulder, not elsewhere classified: Secondary | ICD-10-CM | POA: Diagnosis not present

## 2021-11-30 DIAGNOSIS — Z9889 Other specified postprocedural states: Secondary | ICD-10-CM | POA: Diagnosis not present

## 2021-11-30 DIAGNOSIS — M6281 Muscle weakness (generalized): Secondary | ICD-10-CM | POA: Diagnosis not present

## 2021-11-30 DIAGNOSIS — M25511 Pain in right shoulder: Secondary | ICD-10-CM | POA: Diagnosis not present

## 2021-11-30 DIAGNOSIS — M25611 Stiffness of right shoulder, not elsewhere classified: Secondary | ICD-10-CM | POA: Diagnosis not present

## 2021-12-02 DIAGNOSIS — M25611 Stiffness of right shoulder, not elsewhere classified: Secondary | ICD-10-CM | POA: Diagnosis not present

## 2021-12-02 DIAGNOSIS — Z9889 Other specified postprocedural states: Secondary | ICD-10-CM | POA: Diagnosis not present

## 2021-12-02 DIAGNOSIS — M25511 Pain in right shoulder: Secondary | ICD-10-CM | POA: Diagnosis not present

## 2021-12-02 DIAGNOSIS — M6281 Muscle weakness (generalized): Secondary | ICD-10-CM | POA: Diagnosis not present

## 2021-12-07 DIAGNOSIS — M25611 Stiffness of right shoulder, not elsewhere classified: Secondary | ICD-10-CM | POA: Diagnosis not present

## 2021-12-07 DIAGNOSIS — M6281 Muscle weakness (generalized): Secondary | ICD-10-CM | POA: Diagnosis not present

## 2021-12-07 DIAGNOSIS — Z9889 Other specified postprocedural states: Secondary | ICD-10-CM | POA: Diagnosis not present

## 2021-12-07 DIAGNOSIS — M25511 Pain in right shoulder: Secondary | ICD-10-CM | POA: Diagnosis not present

## 2021-12-08 ENCOUNTER — Encounter: Payer: Self-pay | Admitting: Family Medicine

## 2021-12-09 DIAGNOSIS — M6281 Muscle weakness (generalized): Secondary | ICD-10-CM | POA: Diagnosis not present

## 2021-12-09 DIAGNOSIS — M25511 Pain in right shoulder: Secondary | ICD-10-CM | POA: Diagnosis not present

## 2021-12-09 DIAGNOSIS — Z9889 Other specified postprocedural states: Secondary | ICD-10-CM | POA: Diagnosis not present

## 2021-12-09 DIAGNOSIS — M25611 Stiffness of right shoulder, not elsewhere classified: Secondary | ICD-10-CM | POA: Diagnosis not present

## 2021-12-14 DIAGNOSIS — M25611 Stiffness of right shoulder, not elsewhere classified: Secondary | ICD-10-CM | POA: Diagnosis not present

## 2021-12-14 DIAGNOSIS — Z9889 Other specified postprocedural states: Secondary | ICD-10-CM | POA: Diagnosis not present

## 2021-12-14 DIAGNOSIS — M25511 Pain in right shoulder: Secondary | ICD-10-CM | POA: Diagnosis not present

## 2021-12-14 DIAGNOSIS — M6281 Muscle weakness (generalized): Secondary | ICD-10-CM | POA: Diagnosis not present

## 2021-12-16 DIAGNOSIS — M6281 Muscle weakness (generalized): Secondary | ICD-10-CM | POA: Diagnosis not present

## 2021-12-16 DIAGNOSIS — M25511 Pain in right shoulder: Secondary | ICD-10-CM | POA: Diagnosis not present

## 2021-12-16 DIAGNOSIS — Z9889 Other specified postprocedural states: Secondary | ICD-10-CM | POA: Diagnosis not present

## 2021-12-16 DIAGNOSIS — M25611 Stiffness of right shoulder, not elsewhere classified: Secondary | ICD-10-CM | POA: Diagnosis not present

## 2021-12-23 DIAGNOSIS — M25511 Pain in right shoulder: Secondary | ICD-10-CM | POA: Diagnosis not present

## 2021-12-23 DIAGNOSIS — Z9889 Other specified postprocedural states: Secondary | ICD-10-CM | POA: Diagnosis not present

## 2021-12-23 DIAGNOSIS — M6281 Muscle weakness (generalized): Secondary | ICD-10-CM | POA: Diagnosis not present

## 2021-12-23 DIAGNOSIS — M25611 Stiffness of right shoulder, not elsewhere classified: Secondary | ICD-10-CM | POA: Diagnosis not present

## 2022-01-04 ENCOUNTER — Ambulatory Visit
Admission: RE | Admit: 2022-01-04 | Discharge: 2022-01-04 | Disposition: A | Discharge status: Home / Self Care | Payer: BC Managed Care – PPO | Source: Ambulatory Visit | Attending: Emergency Medicine | Admitting: Emergency Medicine

## 2022-01-04 VITALS — BP 134/69 | HR 72 | Temp 98.1°F | Resp 18

## 2022-01-04 DIAGNOSIS — J069 Acute upper respiratory infection, unspecified: Secondary | ICD-10-CM

## 2022-01-04 MED ORDER — AMOXICILLIN-POT CLAVULANATE 875-125 MG PO TABS: 1.0000 | ORAL_TABLET | Freq: Two times a day (BID) | ORAL | 0 refills | Status: AC

## 2022-01-04 MED ORDER — BENZONATATE 100 MG PO CAPS: 200.0000 mg | ORAL_CAPSULE | Freq: Three times a day (TID) | ORAL | 0 refills | Status: DC

## 2022-01-04 MED ORDER — PROMETHAZINE-DM 6.25-15 MG/5ML PO SYRP: 5.0000 mL | ORAL_SOLUTION | Freq: Four times a day (QID) | ORAL | 0 refills | Status: DC | PRN

## 2022-01-04 MED ORDER — IPRATROPIUM BROMIDE 0.06 % NA SOLN: 2.0000 | Freq: Four times a day (QID) | NASAL | 12 refills | Status: DC

## 2022-01-04 NOTE — ED Triage Notes (Signed)
Pt reports cough and congestion x 2-3 days.

## 2022-01-04 NOTE — Discharge Instructions (Signed)
The Augmentin twice daily with food for 10 days for treatment of your URI.  Perform sinus irrigation 2-3 times a day with a [employer] sinus rinse kit and distilled water.  Do not use tap water.  You can use plain over-the-counter Mucinex every 6 hours to break up the stickiness of the mucus so your body can clear it.  Increase your oral fluid intake to thin out your mucus so that is also able for your body to clear more easily.  Take an over-the-counter probiotic, such as Culturelle-align-activia, 1 hour after each dose of antibiotic to prevent diarrhea.  Use the Atrovent nasal spray, 2 squirts in each nostril every 6 hours, as needed for runny nose and postnasal drip.  Use the Tessalon Perles every 8 hours during the day.  Take them with a small sip of water.  They may give you some numbness to the base of your tongue or a metallic taste in your mouth, this is normal.  Use the Promethazine DM cough syrup at bedtime for cough and congestion.  It will make you drowsy so do not take it during the day.  If you develop any new or worsening symptoms return for reevaluation or see your primary care provider.  

## 2022-01-04 NOTE — ED Provider Notes (Signed)
[facility]    CSN: [id number] Arrival date & time: 01/04/22  1301      History   Chief Complaint Chief Complaint  Patient presents with   Cough    Entered by patient    HPI Eric [name] is a 66 y.o. male.   HPI  65 year old male here for evaluation of respiratory complaints.  Patient reports that he has been experiencing nasal congestion with thick nasal discharge, and a cough that is productive for dark yellow sputum for the last 3 days.  He denies any fever, ear pain, sore throat, shortness of breath, or wheezing.  He states about a week ago he was on a flight with someone who had upper respiratory symptoms but he is unaware of any other sick contact exposure.  Past Medical History:  Diagnosis Date   Anginal pain ([place])    Arthritis    Coronary artery disease 2008   2 stents    Diabetes type 2, controlled ([place])    Diverticulitis 2009, 2012, 2015   recurrent episodes   GERD (gastroesophageal reflux disease)    Glaucoma suspect 09/2013   Dr. Wallace Going   History of chicken pox    History of pneumonia 06/2015   Hyperlipidemia    Hypertension    Salmonella gastroenteritis 02/29/2020   Squamous cell carcinoma of skin 01/20/2020   Superior perianal area. CIS, arising in a condyloma, peripheral margin involved.    Patient Active Problem List   Diagnosis Date Noted   Fatty liver 11/16/2021   Ex-smoker 05/18/2021   COVID-19 virus infection 11/30/2020   Squamous cell carcinoma of anal skin 02/13/2020   Adjustment disorder 12/26/2019   Chronic pain of both shoulders 04/03/2018   Meralgia paresthetica 01/30/2016   Health maintenance examination 03/11/2014   Diverticulosis 07/09/2013   Type 2 diabetes mellitus with other specified complication ([place]) 34/91/7915   Hyperlipidemia associated with type 2 diabetes mellitus ([place]) 06/15/2010   Severe obesity (BMI 35.0-39.9) with comorbidity ([place]) 06/15/2010   HYPERTENSION, BENIGN 06/15/2010   CAD (coronary  artery disease) 06/15/2010    Past Surgical History:  Procedure Laterality Date   ARTHOSCOPIC ROTAOR CUFF REPAIR Right 10/12/2021   Procedure: ARTHROSCOPIC ROTATOR CUFF REPAIR;  Surgeon: Leim Fabry, MD;  Location: [place];  Service: Orthopedics;  Laterality: Right;   BICEPT TENODESIS Right 10/12/2021   Procedure: BICEPS TENODESIS;  Surgeon: Leim Fabry, MD;  Location: [place];  Service: Orthopedics;  Laterality: Right;   COLONOSCOPY  2011   mult diverticula, int hem, rpt 10 yrs ([place])   COLONOSCOPY WITH PROPOFOL N/A 03/14/2016   TAx1, diverticulosis, rpt 5 yrs; Lollie Sails, MD   CORONARY STENT PLACEMENT  9/08   facial injury  2008   hit on right side of face with pipe, facial fracture   MASS EXCISION N/A 05/01/2020   Procedure: EXCISION MASS;  Surgeon: Robert Bellow, MD;  Location: [facility];  Service: General;  Laterality: N/A;   RESECTION DISTAL CLAVICAL Right 10/12/2021   Procedure: RESECTION DISTAL CLAVICAL;  Surgeon: Leim Fabry, MD;  Location: [place];  Service: Orthopedics;  Laterality: Right;   SKIN CANCER EXCISION     SUBACROMIAL DECOMPRESSION Right 10/12/2021   Procedure: SUBACROMIAL DECOMPRESSION;  Surgeon: Leim Fabry, MD;  Location: [place];  Service: Orthopedics;  Laterality: Right;       Home Medications    Prior to Admission medications   Medication Sig Start Date End Date Taking? Authorizing Provider  amoxicillin-clavulanate (AUGMENTIN)  875-125 MG tablet Take 1 tablet by mouth every 12 (twelve) hours for 10 days. 01/04/22 01/14/22 Yes Margarette Canada, NP  benzonatate (TESSALON) 100 MG capsule Take 2 capsules (200 mg total) by mouth every 8 (eight) hours. 01/04/22  Yes Margarette Canada, NP  ipratropium (ATROVENT) 0.06 % nasal spray Place 2 sprays into both nostrils 4 (four) times daily. 01/04/22  Yes Margarette Canada, NP  promethazine-dextromethorphan (PROMETHAZINE-DM) 6.25-15 MG/5ML syrup Take 5 mLs by mouth 4 (four)  times daily as needed. 01/04/22  Yes Margarette Canada, NP  acetaminophen (TYLENOL) 500 MG tablet Take 2 tablets (1,000 mg total) by mouth every 8 (eight) hours. 10/12/21 10/12/22  Leim Fabry, MD  Ascorbic Acid (VITAMIN C) 1000 MG tablet Take 1,000 mg by mouth every evening.     [provider]  aspirin EC 81 MG tablet Take 162 mg by mouth daily.  03/06/18   Minna Merritts, MD  cholecalciferol (VITAMIN D3) 25 MCG (1000 UT) tablet Take 1,000 Units by mouth every evening.     [provider]  ciprofloxacin (CIPRO) 500 MG tablet Take 500 mg by mouth 2 (two) times daily as needed (Diverticulitis).    [provider]  clopidogrel (PLAVIX) 75 MG tablet TAKE 1 TABLET(75 MG) BY MOUTH DAILY 07/12/21   Minna Merritts, MD  Coenzyme Q10 200 MG capsule Take 200 mg by mouth daily in the afternoon.     [provider]  cyclobenzaprine (FLEXERIL) 10 MG tablet Take 1 tablet (10 mg total) by mouth 3 (three) times daily as needed for muscle spasms. 11/19/19   Minna Merritts, MD  diazepam (VALIUM) 5 MG tablet Take 1 tablet (5 mg total) by mouth every 12 (twelve) hours as needed for anxiety or sedation (sleep). 11/17/20   Ria Bush, MD  dicyclomine (BENTYL) 20 MG tablet Take 20 mg by mouth every 6 (six) hours as needed for spasms.    [provider]  diphenhydrAMINE (BENADRYL) 25 MG tablet Take 25 mg by mouth every 6 (six) hours as needed for allergies.     [provider]  docusate sodium (COLACE) 100 MG capsule Take 100 mg by mouth daily.    [provider]  EPINEPHrine 0.3 mg/0.3 mL IJ SOAJ injection INJECT INTRAMUSCULARY AS DIRECTED 05/18/21   Ria Bush, MD  famotidine (PEPCID) 20 MG tablet TAKE 1 TABLET(20 MG) BY MOUTH TWICE DAILY 07/12/21   Minna Merritts, MD  fexofenadine (ALLEGRA) 180 MG tablet Take 1 tablet (180 mg total) by mouth daily. 11/01/21   Minna Merritts, MD  hydrochlorothiazide (HYDRODIURIL) 25 MG tablet TAKE 1 TABLET  BY MOUTH DAILY 07/12/21   Minna Merritts, MD  hydrocortisone 2.5 % cream Apply topically 2 (two) times daily as needed.    [provider]  losartan (COZAAR) 100 MG tablet TAKE 1 TABLET(100 MG) BY MOUTH DAILY 07/12/21   Minna Merritts, MD  metFORMIN (GLUCOPHAGE) 500 MG tablet Take 3 tablets (1,500 mg total) by mouth daily with breakfast. 05/18/21   Ria Bush, MD  Methylcellulose, Laxative, (CITRUCEL) 500 MG TABS Take 1,000 mg by mouth at bedtime.    [provider]  metroNIDAZOLE (FLAGYL) 500 MG tablet Take 500 mg by mouth 3 (three) times daily as needed (Diverticulitis).    [provider]  Multiple Vitamin (MULTIVITAMIN WITH MINERALS) TABS tablet Take 1 tablet by mouth every evening.    [provider]  nitroGLYCERIN (NITROSTAT) 0.4 MG SL tablet Place 1 tablet (0.4 mg total) under  the tongue every 5 (five) minutes x 3 doses as needed for chest pain. PLACE 1 TABLET UNDER THE TONGUE IF NEEDED EVERY 5 MINUTES FOR CHEST PAIN FOR 3 DOSES IF NO RELIEF AFER FIRST DOSE CALL PRESCRIBER OR 911 11/02/20   Minna Merritts, MD  Omega-3 Fatty Acids (FISH OIL CONCENTRATE) 1000 MG CAPS Take 3,000 mg by mouth every evening.     [provider]  omeprazole (PRILOSEC) 40 MG capsule TAKE 1 CAPSULE BY MOUTH DAILY 07/12/21   Minna Merritts, MD  ondansetron (ZOFRAN-ODT) 4 MG disintegrating tablet Take 1 tablet (4 mg total) by mouth every 8 (eight) hours as needed for nausea or vomiting. 10/12/21   Leim Fabry, MD  oxyCODONE (ROXICODONE) 5 MG immediate release tablet Take 1-2 tablets (5-10 mg total) by mouth every 4 (four) hours as needed (pain). 10/12/21 10/12/22  Leim Fabry, MD  propranolol (INDERAL) 20 MG tablet Take 1 tablet (20 mg total) by mouth 3 (three) times daily as needed (tachycardia). 09/28/21   Minna Merritts, MD  rosuvastatin (CRESTOR) 40 MG tablet TAKE 1 TABLET(40 MG) BY MOUTH DAILY 07/12/21   Minna Merritts, MD  sildenafil (VIAGRA) 100 MG  tablet Take 0.5-1 tablets (50-100 mg total) by mouth daily as needed for erectile dysfunction. 05/18/21   Ria Bush, MD  tadalafil (CIALIS) 20 MG tablet Take 1 tablet (20 mg total) by mouth daily as needed for erectile dysfunction. 02/22/21   Minna Merritts, MD  traZODone (DESYREL) 50 MG tablet Take 0.5-1 tablets (25-50 mg total) by mouth at bedtime as needed for sleep. 05/18/21   Ria Bush, MD  TURMERIC PO Take 1,000 mg by mouth in the morning and at bedtime.    [provider]    Family History Family History  Problem Relation Age of Onset   Cancer Mother        Lung and liver   Hypertension Mother    Hypertension Father    CAD Brother        CABG   Diabetes Neg Hx    Stroke Neg Hx     Social History Social History   Tobacco Use   Smoking status: Former    Packs/day: 1.00    Years: 28.00    Total pack years: 28.00    Types: Cigarettes    Quit date: 07/25/2006    Years since quitting: 15.4   Smokeless tobacco: Never  Vaping Use   Vaping Use: Never used  Substance Use Topics   Alcohol use: Yes    Alcohol/week: 0.0 standard drinks of alcohol    Comment: occasional   Drug use: No     Allergies   Other and Soy allergy   Review of Systems Review of Systems  Constitutional:  Negative for fever.  HENT:  Positive for congestion, rhinorrhea and sinus pressure. Negative for ear pain and sore throat.   Respiratory:  Positive for cough. Negative for shortness of breath and wheezing.   Skin:  Negative for rash.  Hematological: Negative.   Psychiatric/Behavioral: Negative.       Physical Exam Triage Vital Signs ED Triage Vitals [01/04/22 1321]  Enc Vitals Group     BP 134/69     Pulse Rate 72     Resp 18     Temp 98.1 F (36.7 C)     Temp Source Oral     SpO2 97 %     Weight      Height  Head Circumference      Peak Flow      Pain Score      Pain Loc      Pain Edu?      Excl. in [place]?    No data found.  Updated Vital Signs BP  134/69 (BP Location: Left Arm)   Pulse 72   Temp 98.1 F (36.7 C) (Oral)   Resp 18   SpO2 97%   Visual Acuity Right Eye Distance:   Left Eye Distance:   Bilateral Distance:    Right Eye Near:   Left Eye Near:    Bilateral Near:     Physical Exam Vitals and nursing note reviewed.  Constitutional:      Appearance: Normal appearance. He is not ill-appearing.  HENT:     Head: Normocephalic and atraumatic.     Right Ear: Tympanic membrane, ear canal and external ear normal. There is no impacted cerumen.     Left Ear: Tympanic membrane, ear canal and external ear normal. There is no impacted cerumen.     Nose: Congestion and rhinorrhea present.     Mouth/Throat:     Mouth: Mucous membranes are moist.     Pharynx: Oropharynx is clear. No posterior oropharyngeal erythema.  Cardiovascular:     Rate and Rhythm: Normal rate and regular rhythm.     Pulses: Normal pulses.     Heart sounds: Normal heart sounds. No murmur heard.    No friction rub. No gallop.  Pulmonary:     Effort: Pulmonary effort is normal.     Breath sounds: Normal breath sounds. No wheezing, rhonchi or rales.  Musculoskeletal:     Cervical back: Normal range of motion and neck supple.  Lymphadenopathy:     Cervical: No cervical adenopathy.  Skin:    General: Skin is warm and dry.     Capillary Refill: Capillary refill takes less than 2 seconds.     Findings: No erythema or rash.  Neurological:     General: No focal deficit present.     Mental Status: He is alert and oriented to person, place, and time.  Psychiatric:        Mood and Affect: Mood normal.        Behavior: Behavior normal.        Thought Content: Thought content normal.        Judgment: Judgment normal.      UC Treatments / Results  Labs (all labs ordered are listed, but only abnormal results are displayed) Labs Reviewed - No data to display  EKG   Radiology No results found.  Procedures Procedures (including critical care  time)  Medications Ordered in UC Medications - No data to display  Initial Impression / Assessment and Plan / UC Course  I have reviewed the triage vital signs and the nursing notes.  Pertinent labs & imaging results that were available during my care of the patient were reviewed by me and considered in my medical decision making (see chart for details).  Patient is a very pleasant, nontoxic-appearing 66 year old male here for evaluation of respiratory complaints as outlined HPI above.  His physical exam reveals pearly-gray tympanic membranes bilaterally with normal light reflex and clear external auditory canals.  Nasal mucosa is markedly erythematous edematous with purulent discharge in both nares.  Oropharyngeal exam is benign.  No cervical lymphadenopathy appreciable exam.  Cardiopulmonary exam reveals S1-S2 heart sounds with regular rate and rhythm and lung sounds that are clear to  auscultation all fields.  Patient does not have tenderness to percussion of frontal or maxillary sinuses either.  Patient's exam is consistent with an upper respiratory infection.  I will place him on Augmentin twice daily for 10 days for treatment of that.  I will also prescribe Atrovent nasal spray to help with the nasal congestion and Tessalon Perles (cough syrup to help with cough.  Return precautions reviewed.   Final Clinical Impressions(s) / UC Diagnoses   Final diagnoses:  Upper respiratory tract infection, unspecified type     Discharge Instructions      The Augmentin twice daily with food for 10 days for treatment of your URI.  Perform sinus irrigation 2-3 times a day with a [employer] sinus rinse kit and distilled water.  Do not use tap water.  You can use plain over-the-counter Mucinex every 6 hours to break up the stickiness of the mucus so your body can clear it.  Increase your oral fluid intake to thin out your mucus so that is also able for your body to clear more easily.  Take an  over-the-counter probiotic, such as Culturelle-align-activia, 1 hour after each dose of antibiotic to prevent diarrhea.  Use the Atrovent nasal spray, 2 squirts in each nostril every 6 hours, as needed for runny nose and postnasal drip.  Use the Tessalon Perles every 8 hours during the day.  Take them with a small sip of water.  They may give you some numbness to the base of your tongue or a metallic taste in your mouth, this is normal.  Use the Promethazine DM cough syrup at bedtime for cough and congestion.  It will make you drowsy so do not take it during the day.  If you develop any new or worsening symptoms return for reevaluation or see your primary care provider.      ED Prescriptions     Medication Sig Dispense Auth. Provider   amoxicillin-clavulanate (AUGMENTIN) 875-125 MG tablet Take 1 tablet by mouth every 12 (twelve) hours for 10 days. 20 tablet Margarette Canada, NP   benzonatate (TESSALON) 100 MG capsule Take 2 capsules (200 mg total) by mouth every 8 (eight) hours. 21 capsule Margarette Canada, NP   ipratropium (ATROVENT) 0.06 % nasal spray Place 2 sprays into both nostrils 4 (four) times daily. 15 mL Margarette Canada, NP   promethazine-dextromethorphan (PROMETHAZINE-DM) 6.25-15 MG/5ML syrup Take 5 mLs by mouth 4 (four) times daily as needed. 118 mL Margarette Canada, NP      PDMP not reviewed this encounter.   Margarette Canada, NP 01/04/22 1356

## 2022-01-06 DIAGNOSIS — M25611 Stiffness of right shoulder, not elsewhere classified: Secondary | ICD-10-CM | POA: Diagnosis not present

## 2022-01-06 DIAGNOSIS — Z9889 Other specified postprocedural states: Secondary | ICD-10-CM | POA: Diagnosis not present

## 2022-01-06 DIAGNOSIS — M25511 Pain in right shoulder: Secondary | ICD-10-CM | POA: Diagnosis not present

## 2022-01-06 DIAGNOSIS — M6281 Muscle weakness (generalized): Secondary | ICD-10-CM | POA: Diagnosis not present

## 2022-01-11 DIAGNOSIS — M25611 Stiffness of right shoulder, not elsewhere classified: Secondary | ICD-10-CM | POA: Diagnosis not present

## 2022-01-11 DIAGNOSIS — M6281 Muscle weakness (generalized): Secondary | ICD-10-CM | POA: Diagnosis not present

## 2022-01-11 DIAGNOSIS — M25511 Pain in right shoulder: Secondary | ICD-10-CM | POA: Diagnosis not present

## 2022-01-11 DIAGNOSIS — Z9889 Other specified postprocedural states: Secondary | ICD-10-CM | POA: Diagnosis not present

## 2022-01-13 DIAGNOSIS — Z9889 Other specified postprocedural states: Secondary | ICD-10-CM | POA: Diagnosis not present

## 2022-01-13 DIAGNOSIS — M6281 Muscle weakness (generalized): Secondary | ICD-10-CM | POA: Diagnosis not present

## 2022-01-13 DIAGNOSIS — M25611 Stiffness of right shoulder, not elsewhere classified: Secondary | ICD-10-CM | POA: Diagnosis not present

## 2022-01-13 DIAGNOSIS — M25511 Pain in right shoulder: Secondary | ICD-10-CM | POA: Diagnosis not present

## 2022-01-24 DIAGNOSIS — M25511 Pain in right shoulder: Secondary | ICD-10-CM | POA: Diagnosis not present

## 2022-01-24 DIAGNOSIS — M25611 Stiffness of right shoulder, not elsewhere classified: Secondary | ICD-10-CM | POA: Diagnosis not present

## 2022-01-24 DIAGNOSIS — M6281 Muscle weakness (generalized): Secondary | ICD-10-CM | POA: Diagnosis not present

## 2022-01-24 DIAGNOSIS — Z9889 Other specified postprocedural states: Secondary | ICD-10-CM | POA: Diagnosis not present

## 2022-01-31 DIAGNOSIS — M25511 Pain in right shoulder: Secondary | ICD-10-CM | POA: Diagnosis not present

## 2022-01-31 DIAGNOSIS — M6281 Muscle weakness (generalized): Secondary | ICD-10-CM | POA: Diagnosis not present

## 2022-01-31 DIAGNOSIS — M25611 Stiffness of right shoulder, not elsewhere classified: Secondary | ICD-10-CM | POA: Diagnosis not present

## 2022-01-31 DIAGNOSIS — Z9889 Other specified postprocedural states: Secondary | ICD-10-CM | POA: Diagnosis not present

## 2022-02-03 DIAGNOSIS — M6281 Muscle weakness (generalized): Secondary | ICD-10-CM | POA: Diagnosis not present

## 2022-02-03 DIAGNOSIS — M25611 Stiffness of right shoulder, not elsewhere classified: Secondary | ICD-10-CM | POA: Diagnosis not present

## 2022-02-03 DIAGNOSIS — M25511 Pain in right shoulder: Secondary | ICD-10-CM | POA: Diagnosis not present

## 2022-02-03 DIAGNOSIS — Z9889 Other specified postprocedural states: Secondary | ICD-10-CM | POA: Diagnosis not present

## 2022-02-10 ENCOUNTER — Telehealth: Payer: Self-pay | Admitting: Family Medicine

## 2022-02-10 NOTE — Telephone Encounter (Signed)
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 He stated he had one done in April 2023. Patient was wanting to either schedule an appointment to get this checked or either get a copy of his last A1C check. Please advise. Thank you!

## 2022-02-10 NOTE — Telephone Encounter (Signed)
Spoke with pt offering to print 11/16/21 A1c results for him to pick up since f/u is not 05/24/22.  Pt agrees and will pick up results today.   [Placed lab result at front office.]

## 2022-03-29 ENCOUNTER — Ambulatory Visit
Admission: RE | Admit: 2022-03-29 | Discharge: 2022-03-29 | Disposition: A | Discharge status: Home / Self Care | Payer: BC Managed Care – PPO | Source: Ambulatory Visit | Attending: Emergency Medicine | Admitting: Emergency Medicine

## 2022-03-29 VITALS — BP 116/63 | HR 71 | Temp 98.0°F | Resp 16

## 2022-03-29 DIAGNOSIS — U071 COVID-19: Secondary | ICD-10-CM

## 2022-03-29 LAB — SARS CORONAVIRUS 2 BY RT PCR: SARS Coronavirus 2 by RT PCR: POSITIVE — AB

## 2022-03-29 MED ORDER — BENZONATATE 100 MG PO CAPS: 200.0000 mg | ORAL_CAPSULE | Freq: Three times a day (TID) | ORAL | 0 refills | Status: DC

## 2022-03-29 MED ORDER — MOLNUPIRAVIR EUA 200MG CAPSULE
4.0000 | ORAL_CAPSULE | Freq: Two times a day (BID) | ORAL | 0 refills | Status: DC
Start: 2022-03-29 — End: 2022-03-29

## 2022-03-29 MED ORDER — IPRATROPIUM BROMIDE 0.06 % NA SOLN: 2.0000 | Freq: Four times a day (QID) | NASAL | 12 refills | Status: DC

## 2022-03-29 MED ORDER — PROMETHAZINE-DM 6.25-15 MG/5ML PO SYRP: 5.0000 mL | ORAL_SOLUTION | Freq: Four times a day (QID) | ORAL | 0 refills | Status: DC | PRN

## 2022-03-29 MED ORDER — NIRMATRELVIR/RITONAVIR (PAXLOVID)TABLET: 3.0000 | ORAL_TABLET | Freq: Two times a day (BID) | ORAL | 0 refills | Status: AC

## 2022-03-29 NOTE — ED Triage Notes (Signed)
Pt c/o head feel stopped up, cough, burning eyes, fatigue and a scratchy throat x 3 days. At home covid test negative last night.

## 2022-03-29 NOTE — Discharge Instructions (Signed)
You have tested positive for COVID-19 today.  You will need to quarantine for 5 days from onset of your symptoms.  After 5 days you can break quarantine if your symptoms have improved and you have not had a fever for 24 hours without taking Tylenol and ibuprofen.  Use over-the-counter Tylenol and ibuprofen according to the package instructions as needed for pain and fever.  Take the molnupiravir twice daily for 5 days for treatment of COVID-19.  Use the Atrovent nasal spray, 2 squirts in each nostril every 6 hours, as needed for runny nose and postnasal drip.  Use the Tessalon Perles every 8 hours during the day.  Take them with a small sip of water.  They may give you some numbness to the base of your tongue or a metallic taste in your mouth, this is normal.  Use the Promethazine DM cough syrup at bedtime for cough and congestion.  It will make you drowsy so do not take it during the day.  If you develop any shortness of breath, especially at rest, feel you cannot catch her breath, you are unable to speak in full sentences, or is a late sign your lips began turning blue you need to call 911 and go to the emergency department.

## 2022-03-29 NOTE — ED Provider Notes (Signed)
[facility]    CSN: [id number] Arrival date & time: 03/29/22  1241      History   Chief Complaint Chief Complaint  Patient presents with   Sore Throat    Head and body aches Head stopped up Eyes burn - Entered by patient    HPI ZYRUS HETLAND is a 66 y.o. male.   HPI  66 year old male here for evaluation of respiratory issues.  Patient reports that he recently traveled to [facility] and was exposed to a lot of people.  He took a home COVID test but it was negative but he is not sure if he did it right.  He reports that for last 3 days he has been experiencing nasal congestion, his eyes feeling hot, fatigue, scratchy throat, and a cough.  He has had some slight brown nasal discharge and some brown sputum production but it is only in the mornings.  He denies fever, eye drainage, ear pain, shortness of breath, or wheezing.  Past Medical History:  Diagnosis Date   Anginal pain ([place])    Arthritis    Coronary artery disease 2008   2 stents    Diabetes type 2, controlled ([place])    Diverticulitis 2009, 2012, 2015   recurrent episodes   GERD (gastroesophageal reflux disease)    Glaucoma suspect 09/2013   Dr. Wallace Going   History of chicken pox    History of pneumonia 06/2015   Hyperlipidemia    Hypertension    Salmonella gastroenteritis 02/29/2020   Squamous cell carcinoma of skin 01/20/2020   Superior perianal area. CIS, arising in a condyloma, peripheral margin involved.    Patient Active Problem List   Diagnosis Date Noted   Fatty liver 11/16/2021   Ex-smoker 05/18/2021   COVID-19 virus infection 11/30/2020   Squamous cell carcinoma of anal skin 02/13/2020   Adjustment disorder 12/26/2019   Chronic pain of both shoulders 04/03/2018   Meralgia paresthetica 01/30/2016   Health maintenance examination 03/11/2014   Diverticulosis 07/09/2013   Type 2 diabetes mellitus with other specified complication ([place]) 94/49/6759   Hyperlipidemia associated with type 2  diabetes mellitus ([place]) 06/15/2010   Severe obesity (BMI 35.0-39.9) with comorbidity ([place]) 06/15/2010   HYPERTENSION, BENIGN 06/15/2010   CAD (coronary artery disease) 06/15/2010    Past Surgical History:  Procedure Laterality Date   ARTHOSCOPIC ROTAOR CUFF REPAIR Right 10/12/2021   Procedure: ARTHROSCOPIC ROTATOR CUFF REPAIR;  Surgeon: Leim Fabry, MD;  Location: [place];  Service: Orthopedics;  Laterality: Right;   BICEPT TENODESIS Right 10/12/2021   Procedure: BICEPS TENODESIS;  Surgeon: Leim Fabry, MD;  Location: [place];  Service: Orthopedics;  Laterality: Right;   COLONOSCOPY  2011   mult diverticula, int hem, rpt 10 yrs ([place])   COLONOSCOPY WITH PROPOFOL N/A 03/14/2016   TAx1, diverticulosis, rpt 5 yrs; Lollie Sails, MD   CORONARY STENT PLACEMENT  9/08   facial injury  2008   hit on right side of face with pipe, facial fracture   MASS EXCISION N/A 05/01/2020   Procedure: EXCISION MASS;  Surgeon: Robert Bellow, MD;  Location: [facility];  Service: General;  Laterality: N/A;   RESECTION DISTAL CLAVICAL Right 10/12/2021   Procedure: RESECTION DISTAL CLAVICAL;  Surgeon: Leim Fabry, MD;  Location: [place];  Service: Orthopedics;  Laterality: Right;   SKIN CANCER EXCISION     SUBACROMIAL DECOMPRESSION Right 10/12/2021   Procedure: SUBACROMIAL DECOMPRESSION;  Surgeon: Leim Fabry, MD;  Location: [place];  Service: Orthopedics;  Laterality: Right;       Home Medications    Prior to Admission medications   Medication Sig Start Date End Date Taking? Authorizing Provider  acetaminophen (TYLENOL) 500 MG tablet Take 2 tablets (1,000 mg total) by mouth every 8 (eight) hours. 10/12/21 10/12/22 Yes Leim Fabry, MD  Ascorbic Acid (VITAMIN C) 1000 MG tablet Take 1,000 mg by mouth every evening.    Yes [provider]  aspirin EC 81 MG tablet Take 162 mg by mouth daily.  03/06/18  Yes Gollan, Kathlene November, MD  benzonatate  (TESSALON) 100 MG capsule Take 2 capsules (200 mg total) by mouth every 8 (eight) hours. 03/29/22  Yes Margarette Canada, NP  cholecalciferol (VITAMIN D3) 25 MCG (1000 UT) tablet Take 1,000 Units by mouth every evening.    Yes [provider]  clopidogrel (PLAVIX) 75 MG tablet TAKE 1 TABLET(75 MG) BY MOUTH DAILY 07/12/21  Yes Gollan, Kathlene November, MD  Coenzyme Q10 200 MG capsule Take 200 mg by mouth daily in the afternoon.    Yes [provider]  dicyclomine (BENTYL) 20 MG tablet Take 20 mg by mouth every 6 (six) hours as needed for spasms.   Yes [provider]  EPINEPHrine 0.3 mg/0.3 mL IJ SOAJ injection INJECT INTRAMUSCULARY AS DIRECTED 05/18/21  Yes Ria Bush, MD  ipratropium (ATROVENT) 0.06 % nasal spray Place 2 sprays into both nostrils 4 (four) times daily. 03/29/22  Yes Margarette Canada, NP  molnupiravir EUA (LAGEVRIO) 200 mg CAPS capsule Take 4 capsules (800 mg total) by mouth 2 (two) times daily for 5 days. 03/29/22 04/03/22 Yes Margarette Canada, NP  omeprazole (PRILOSEC) 40 MG capsule TAKE 1 CAPSULE BY MOUTH DAILY 07/12/21  Yes Gollan, Kathlene November, MD  promethazine-dextromethorphan (PROMETHAZINE-DM) 6.25-15 MG/5ML syrup Take 5 mLs by mouth 4 (four) times daily as needed. 03/29/22  Yes Margarette Canada, NP  propranolol (INDERAL) 20 MG tablet Take 1 tablet (20 mg total) by mouth 3 (three) times daily as needed (tachycardia). 09/28/21  Yes Gollan, Kathlene November, MD  rosuvastatin (CRESTOR) 40 MG tablet TAKE 1 TABLET(40 MG) BY MOUTH DAILY 07/12/21  Yes Gollan, Kathlene November, MD  sildenafil (VIAGRA) 100 MG tablet Take 0.5-1 tablets (50-100 mg total) by mouth daily as needed for erectile dysfunction. 05/18/21  Yes Ria Bush, MD  tadalafil (CIALIS) 20 MG tablet Take 1 tablet (20 mg total) by mouth daily as needed for erectile dysfunction. 02/22/21  Yes Minna Merritts, MD  traZODone (DESYREL) 50 MG tablet Take 0.5-1 tablets (25-50 mg total) by mouth at bedtime as needed for sleep. 05/18/21  Yes  Ria Bush, MD  TURMERIC PO Take 1,000 mg by mouth in the morning and at bedtime.   Yes [provider]  ciprofloxacin (CIPRO) 500 MG tablet Take 500 mg by mouth 2 (two) times daily as needed (Diverticulitis).    [provider]  cyclobenzaprine (FLEXERIL) 10 MG tablet Take 1 tablet (10 mg total) by mouth 3 (three) times daily as needed for muscle spasms. 11/19/19   Minna Merritts, MD  diazepam (VALIUM) 5 MG tablet Take 1 tablet (5 mg total) by mouth every 12 (twelve) hours as needed for anxiety or sedation (sleep). 11/17/20   Ria Bush, MD  diphenhydrAMINE (BENADRYL) 25 MG tablet Take 25 mg by mouth every 6 (six) hours as needed for allergies.     [provider]  docusate sodium (COLACE) 100 MG capsule Take 100 mg by mouth daily.    [provider]  famotidine (PEPCID) 20 MG tablet TAKE 1 TABLET(20 MG) BY MOUTH TWICE DAILY 07/12/21   Minna Merritts, MD  fexofenadine (ALLEGRA) 180 MG tablet Take 1 tablet (180 mg total) by mouth daily. 11/01/21   Minna Merritts, MD  hydrochlorothiazide (HYDRODIURIL) 25 MG tablet TAKE 1 TABLET BY MOUTH DAILY 07/12/21   Minna Merritts, MD  hydrocortisone 2.5 % cream Apply topically 2 (two) times daily as needed.    [provider]  losartan (COZAAR) 100 MG tablet TAKE 1 TABLET(100 MG) BY MOUTH DAILY 07/12/21   Minna Merritts, MD  metFORMIN (GLUCOPHAGE) 500 MG tablet Take 3 tablets (1,500 mg total) by mouth daily with breakfast. 05/18/21   Ria Bush, MD  Methylcellulose, Laxative, (CITRUCEL) 500 MG TABS Take 1,000 mg by mouth at bedtime.    [provider]  metroNIDAZOLE (FLAGYL) 500 MG tablet Take 500 mg by mouth 3 (three) times daily as needed (Diverticulitis).    [provider]  Multiple Vitamin (MULTIVITAMIN WITH MINERALS) TABS tablet Take 1 tablet by mouth every evening.    [provider]  nitroGLYCERIN (NITROSTAT) 0.4 MG SL tablet Place 1 tablet (0.4 mg  total) under the tongue every 5 (five) minutes x 3 doses as needed for chest pain. PLACE 1 TABLET UNDER THE TONGUE IF NEEDED EVERY 5 MINUTES FOR CHEST PAIN FOR 3 DOSES IF NO RELIEF AFER FIRST DOSE CALL PRESCRIBER OR 911 11/02/20   Minna Merritts, MD  Omega-3 Fatty Acids (FISH OIL CONCENTRATE) 1000 MG CAPS Take 3,000 mg by mouth every evening.     [provider]  ondansetron (ZOFRAN-ODT) 4 MG disintegrating tablet Take 1 tablet (4 mg total) by mouth every 8 (eight) hours as needed for nausea or vomiting. 10/12/21   Leim Fabry, MD  oxyCODONE (ROXICODONE) 5 MG immediate release tablet Take 1-2 tablets (5-10 mg total) by mouth every 4 (four) hours as needed (pain). 10/12/21 10/12/22  Leim Fabry, MD    Family History Family History  Problem Relation Age of Onset   Cancer Mother        Lung and liver   Hypertension Mother    Hypertension Father    CAD Brother        CABG   Diabetes Neg Hx    Stroke Neg Hx     Social History Social History   Tobacco Use   Smoking status: Former    Packs/day: 1.00    Years: 28.00    Total pack years: 28.00    Types: Cigarettes    Quit date: 07/25/2006    Years since quitting: 15.6   Smokeless tobacco: Never  Vaping Use   Vaping Use: Never used  Substance Use Topics   Alcohol use: Yes    Alcohol/week: 0.0 standard drinks of alcohol    Comment: occasional   Drug use: No     Allergies   Other and Soy allergy   Review of Systems Review of Systems  Constitutional:  Negative for fever.  HENT:  Positive for congestion, rhinorrhea and sore throat. Negative for ear pain.   Eyes:  Negative for discharge.  Respiratory:  Positive for cough. Negative for shortness of breath and wheezing.   Gastrointestinal:  Negative for diarrhea, nausea and vomiting.  Skin:  Negative for rash.  Hematological: Negative.   Psychiatric/Behavioral: Negative.       Physical Exam Triage Vital Signs ED Triage Vitals  Enc Vitals Group     BP 03/29/22 1258  116/63     Pulse Rate 03/29/22 1258 71     Resp 03/29/22 1258 16     Temp 03/29/22 1258 98 F (36.7 C)     Temp Source 03/29/22 1258 Oral     SpO2 03/29/22 1258 99 %     Weight --      Height --      Head Circumference --      [place] --      Pain Score 03/29/22 1255 0     Pain Loc --      Pain Edu? --      Excl. in [place]? --    No data found.  Updated Vital Signs BP 116/63 (BP Location: Left Arm)   Pulse 71   Temp 98 F (36.7 C) (Oral)   Resp 16   SpO2 99%   Visual Acuity Right Eye Distance:   Left Eye Distance:   Bilateral Distance:    Right Eye Near:   Left Eye Near:    Bilateral Near:     Physical Exam Vitals and nursing note reviewed.  Constitutional:      Appearance: Normal appearance. He is not ill-appearing.  HENT:     Head: Normocephalic and atraumatic.     Right Ear: Tympanic membrane, ear canal and external ear normal. There is no impacted cerumen.     Left Ear: Tympanic membrane, ear canal and external ear normal. There is no impacted cerumen.     Nose: Congestion and rhinorrhea present.     Mouth/Throat:     Mouth: Mucous membranes are moist.     Pharynx: Oropharynx is clear. Posterior oropharyngeal erythema present.  Cardiovascular:     Rate and Rhythm: Normal rate and regular rhythm.     Pulses: Normal pulses.     Heart sounds: Normal heart sounds. No murmur heard.    No friction rub. No gallop.  Pulmonary:     Effort: Pulmonary effort is normal.     Breath sounds: Normal breath sounds. No wheezing, rhonchi or rales.  Musculoskeletal:     Cervical back: Normal range of motion and neck supple.  Lymphadenopathy:     Cervical: No cervical adenopathy.  Skin:    General: Skin is warm and dry.     Capillary Refill: Capillary refill takes less than 2 seconds.     Findings: No erythema or rash.  Neurological:     General: No focal deficit present.     Mental Status: He is alert and oriented to person, place, and time.  Psychiatric:        Mood  and Affect: Mood normal.        Behavior: Behavior normal.        Thought Content: Thought content normal.        Judgment: Judgment normal.      UC Treatments / Results  Labs (all labs ordered are listed, but only abnormal results are displayed) Labs Reviewed  SARS CORONAVIRUS 2 BY RT PCR - Abnormal; Notable for the following components:      Result Value   SARS Coronavirus 2 by RT PCR POSITIVE (*)    All other components within normal limits    EKG   Radiology No results found.  Procedures Procedures (including critical care time)  Medications Ordered in UC Medications - No data to display  Initial Impression / Assessment and Plan / UC Course  I have reviewed the triage vital signs and the nursing notes.  Pertinent  labs & imaging results that were available during my care of the patient were reviewed by me and considered in my medical decision making (see chart for details).   Patient is nontoxic-appearing 66 year old male here for evaluation Rester complaints outlined HPI above.  On exam patient has pearly-gray tympanic membranes bilaterally with normal light reflex and clear external auditory canals.  Nasal mucosa is erythematous edematous with clear discharge in both nares.  Oropharyngeal exam reveals posterior oropharyngeal erythema and injection with clear postnasal drip.  No anterior cervical lymphadenopathy palpable on exam.  Cardiopulmonary exam reveals S1-S2 heart sounds with regular rate and rhythm and lung sounds are clear to auscultation all fields.  Patient exam is consistent with an upper respiratory infection, most likely viral.  Patient is requesting COVID PCR testing here in clinic and I will do so even though his home COVID test was negative.  COVID PCR is positive.  I will start patient on molnupiravir twice daily for 5 days for treatment of COVID-19.  I will give Atrovent nasal spray, Tessalon Perles, Promethazine DM cough syrup to help with cough  symptoms.  Patient should quarantine for 5 days from onset of symptoms.  I will give him a work note to cover the quarantine duration.  ER precautions reviewed.   Final Clinical Impressions(s) / UC Diagnoses   Final diagnoses:  [id number]     Discharge Instructions      You have tested positive for COVID-19 today.  You will need to quarantine for 5 days from onset of your symptoms.  After 5 days you can break quarantine if your symptoms have improved and you have not had a fever for 24 hours without taking Tylenol and ibuprofen.  Use over-the-counter Tylenol and ibuprofen according to the package instructions as needed for pain and fever.  Take the molnupiravir twice daily for 5 days for treatment of COVID-19.  Use the Atrovent nasal spray, 2 squirts in each nostril every 6 hours, as needed for runny nose and postnasal drip.  Use the Tessalon Perles every 8 hours during the day.  Take them with a small sip of water.  They may give you some numbness to the base of your tongue or a metallic taste in your mouth, this is normal.  Use the Promethazine DM cough syrup at bedtime for cough and congestion.  It will make you drowsy so do not take it during the day.  If you develop any shortness of breath, especially at rest, feel you cannot catch her breath, you are unable to speak in full sentences, or is a late sign your lips began turning blue you need to call 911 and go to the emergency department.     ED Prescriptions     Medication Sig Dispense Auth. Provider   benzonatate (TESSALON) 100 MG capsule Take 2 capsules (200 mg total) by mouth every 8 (eight) hours. 21 capsule Margarette Canada, NP   ipratropium (ATROVENT) 0.06 % nasal spray Place 2 sprays into both nostrils 4 (four) times daily. 15 mL Margarette Canada, NP   promethazine-dextromethorphan (PROMETHAZINE-DM) 6.25-15 MG/5ML syrup Take 5 mLs by mouth 4 (four) times daily as needed. 118 mL Margarette Canada, NP   molnupiravir EUA (LAGEVRIO) 200  mg CAPS capsule Take 4 capsules (800 mg total) by mouth 2 (two) times daily for 5 days. 40 capsule Margarette Canada, NP      PDMP not reviewed this encounter.   Margarette Canada, NP 03/29/22 1415
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
## 2022-04-01 ENCOUNTER — Ambulatory Visit: Payer: BC Managed Care – PPO | Attending: Cardiovascular Disease | Admitting: Cardiovascular Disease

## 2022-04-01 ENCOUNTER — Encounter: Payer: Self-pay | Admitting: Cardiovascular Disease

## 2022-04-01 VITALS — BP 110/80 | HR 78 | Ht 69.0 in | Wt 247.1 lb

## 2022-04-01 DIAGNOSIS — I209 Angina pectoris, unspecified: Secondary | ICD-10-CM

## 2022-04-01 DIAGNOSIS — E782 Mixed hyperlipidemia: Secondary | ICD-10-CM

## 2022-04-01 DIAGNOSIS — I25118 Atherosclerotic heart disease of native coronary artery with other forms of angina pectoris: Secondary | ICD-10-CM

## 2022-04-01 DIAGNOSIS — E118 Type 2 diabetes mellitus with unspecified complications: Secondary | ICD-10-CM

## 2022-04-01 DIAGNOSIS — I4719 Other supraventricular tachycardia: Secondary | ICD-10-CM

## 2022-04-01 DIAGNOSIS — I10 Essential (primary) hypertension: Secondary | ICD-10-CM

## 2022-04-01 DIAGNOSIS — I471 Supraventricular tachycardia: Secondary | ICD-10-CM | POA: Diagnosis not present

## 2022-04-01 MED ORDER — CYCLOBENZAPRINE HCL 10 MG PO TABS: 10.0000 mg | ORAL_TABLET | Freq: Three times a day (TID) | ORAL | 1 refills | Status: DC | PRN

## 2022-04-01 MED ORDER — CIPROFLOXACIN HCL 500 MG PO TABS: 500.0000 mg | ORAL_TABLET | Freq: Two times a day (BID) | ORAL | 1 refills | Status: DC | PRN

## 2022-04-01 MED ORDER — TRAMADOL HCL 50 MG PO TABS: 50.0000 mg | ORAL_TABLET | Freq: Four times a day (QID) | ORAL | 1 refills | Status: DC | PRN

## 2022-04-01 MED ORDER — ROSUVASTATIN CALCIUM 40 MG PO TABS: 40.0000 mg | ORAL_TABLET | Freq: Every day | ORAL | 3 refills | Status: DC

## 2022-04-01 MED ORDER — METRONIDAZOLE 500 MG PO TABS: 500.0000 mg | ORAL_TABLET | Freq: Three times a day (TID) | ORAL | 1 refills | Status: DC | PRN

## 2022-04-01 MED ORDER — CLOPIDOGREL BISULFATE 75 MG PO TABS: 75.0000 mg | ORAL_TABLET | Freq: Every day | ORAL | 3 refills | Status: DC

## 2022-04-01 MED ORDER — FEXOFENADINE HCL 180 MG PO TABS: 180.0000 mg | ORAL_TABLET | Freq: Every day | ORAL | 3 refills | Status: DC

## 2022-04-01 MED ORDER — ASPIRIN 81 MG PO TBEC: 81.0000 mg | DELAYED_RELEASE_TABLET | Freq: Every day | ORAL | 3 refills | Status: DC

## 2022-04-01 NOTE — Patient Instructions (Signed)
Medication Instructions:  - Your physician recommends that you continue on your current medications as directed. Please refer to the Current Medication list given to you today.  *If you need a refill on your cardiac medications before your next appointment, please call your pharmacy*   Lab Work: - none ordered  If you have labs (blood work) drawn today and your tests are completely normal, you will receive your results only by: [place] (if you have MyChart) OR A paper copy in the mail If you have any lab test that is abnormal or we need to change your treatment, we will call you to review the results.   Testing/Procedures: - none ordered   Follow-Up: At [facility], you and your health needs are our priority.  As part of our continuing mission to provide you with exceptional heart care, we have created designated Provider Care Teams.  These Care Teams include your primary Cardiologist (physician) and Advanced Practice Providers (APPs -  Physician Assistants and Nurse Practitioners) who all work together to provide you with the care you need, when you need it.  We recommend signing up for the patient portal called "MyChart".  Sign up information is provided on this After Visit Summary.  MyChart is used to connect with patients for Virtual Visits (Telemedicine).  Patients are able to view lab/test results, encounter notes, upcoming appointments, etc.  Non-urgent messages can be sent to your provider as well.   To learn more about what you can do with MyChart, go to [email or web address].    Your next appointment:   6 month(s)  The format for your next appointment:   In Person  Provider:   You may see Ida Rogue, MD or one of the following Advanced Practice Providers on your designated Care Team:   Murray Hodgkins, NP Christell Faith, PA-C Cadence Kathlen Mody, PA-C Gerrie Nordmann, NP    Other Instructions N/a  Important Information About Sugar

## 2022-04-06 DIAGNOSIS — G8929 Other chronic pain: Secondary | ICD-10-CM | POA: Diagnosis not present

## 2022-04-06 DIAGNOSIS — M25512 Pain in left shoulder: Secondary | ICD-10-CM | POA: Diagnosis not present

## 2022-04-07 ENCOUNTER — Ambulatory Visit (INDEPENDENT_AMBULATORY_CARE_PROVIDER_SITE_OTHER): Payer: BC Managed Care – PPO | Admitting: Dermatology

## 2022-04-07 ENCOUNTER — Other Ambulatory Visit: Payer: Self-pay | Admitting: Cardiovascular Disease

## 2022-04-07 ENCOUNTER — Other Ambulatory Visit: Payer: Self-pay | Admitting: Orthopedic Surgery

## 2022-04-07 DIAGNOSIS — L821 Other seborrheic keratosis: Secondary | ICD-10-CM

## 2022-04-07 DIAGNOSIS — D1801 Hemangioma of skin and subcutaneous tissue: Secondary | ICD-10-CM

## 2022-04-07 DIAGNOSIS — Z1283 Encounter for screening for malignant neoplasm of skin: Secondary | ICD-10-CM | POA: Diagnosis not present

## 2022-04-07 DIAGNOSIS — L814 Other melanin hyperpigmentation: Secondary | ICD-10-CM

## 2022-04-07 DIAGNOSIS — L219 Seborrheic dermatitis, unspecified: Secondary | ICD-10-CM

## 2022-04-07 DIAGNOSIS — Z8589 Personal history of malignant neoplasm of other organs and systems: Secondary | ICD-10-CM

## 2022-04-07 DIAGNOSIS — L918 Other hypertrophic disorders of the skin: Secondary | ICD-10-CM

## 2022-04-07 DIAGNOSIS — G8929 Other chronic pain: Secondary | ICD-10-CM

## 2022-04-07 DIAGNOSIS — Z85828 Personal history of other malignant neoplasm of skin: Secondary | ICD-10-CM

## 2022-04-07 DIAGNOSIS — I781 Nevus, non-neoplastic: Secondary | ICD-10-CM

## 2022-04-07 DIAGNOSIS — L578 Other skin changes due to chronic exposure to nonionizing radiation: Secondary | ICD-10-CM

## 2022-04-07 DIAGNOSIS — L820 Inflamed seborrheic keratosis: Secondary | ICD-10-CM | POA: Diagnosis not present

## 2022-04-07 DIAGNOSIS — D229 Melanocytic nevi, unspecified: Secondary | ICD-10-CM

## 2022-04-07 MED ORDER — HYDROCORTISONE 2.5 % EX CREA: TOPICAL_CREAM | CUTANEOUS | 11 refills | Status: DC

## 2022-04-07 MED ORDER — KETOCONAZOLE 2 % EX CREA: TOPICAL_CREAM | CUTANEOUS | 11 refills | Status: DC

## 2022-04-07 NOTE — Progress Notes (Unsigned)
Follow-Up Visit   Subjective  Eric [name] is a 66 y.o. male who presents for the following: Annual Exam (Yearly exam. Recheck hx of scc. Patient reports some spots at right side of face/temple, right arm, chest and other areas he would like checked. ). The patient presents for Total-Body Skin Exam (TBSE) for skin cancer screening and mole check.  The patient has spots, moles and lesions to be evaluated, some may be new or changing and the patient has concerns that these could be cancer.  The following portions of the chart were reviewed this encounter and updated as appropriate:  Tobacco  Allergies  Meds  Problems  Med Hx  Surg Hx  Fam Hx     Review of Systems: No other skin or systemic complaints except as noted in HPI or Assessment and Plan.  Objective  Well appearing patient in no apparent distress; mood and affect are within normal limits.  A full examination was performed including scalp, head, eyes, ears, nose, lips, neck, chest, axillae, abdomen, back, buttocks, bilateral upper extremities, bilateral lower extremities, hands, feet, fingers, toes, fingernails, and toenails. All findings within normal limits unless otherwise noted below.  b/l brows, glabella Pink patches with greasy scale.   face, chest, shoulders, neck, right arm , right leg x 19 (19) Erythematous stuck-on, waxy papule or plaque   Assessment & Plan  Seborrheic dermatitis b/l brows, glabella  Seborrheic Dermatitis  -  is a chronic persistent rash characterized by pinkness and scaling most commonly of the mid face but also can occur on the scalp (dandruff), ears; mid chest, mid back and groin.  It tends to be exacerbated by stress and cooler weather.  People who have neurologic disease may experience new onset or exacerbation of existing seborrheic dermatitis.  The condition is not curable but treatable and can be controlled.  Start Ketoconazole 2  % cream - apply topically to aa's of eyebrows and  forehead once daily M-W-F.  Start Hydrocortisone 2.5 % cream - apply topically to aa's of eyebrows and forehead daily on T-Th-Sat.   Topical steroids (such as triamcinolone, fluocinolone, fluocinonide, mometasone, clobetasol, halobetasol, betamethasone, hydrocortisone) can cause thinning and lightening of the skin if they are used for too long in the same area. Your physician has selected the right strength medicine for your problem and area affected on the body. Please use your medication only as directed by your physician to prevent side effects.   ketoconazole (NIZORAL) 2 % cream - b/l brows, glabella Apply topically to aa's of eyebrows and forehead once daily M-W-F hydrocortisone 2.5 % cream - b/l brows, glabella Apply to affected areas at eyebrows and forehead daily on T, Thur, Saturday.  Inflamed seborrheic keratosis (19) face, chest, shoulders, neck, right arm , right leg x 19 Symptomatic, irritating, patient would like treated. Destruction of lesion - face, chest, shoulders, neck, right arm , right leg x 19 Complexity: simple   Destruction method: cryotherapy   Informed consent: discussed and consent obtained   Timeout:  patient name, date of birth, surgical site, and procedure verified Lesion destroyed using liquid nitrogen: Yes   Region frozen until ice ball extended beyond lesion: Yes   Outcome: patient tolerated procedure well with no complications   Post-procedure details: wound care instructions given   Additional details:  Prior to procedure, discussed risks of blister formation, small wound, skin dyspigmentation, or rare scar following cryotherapy. Recommend Vaseline ointment to treated areas while healing.  Lentigines - Scattered tan macules -  Due to sun exposure - Benign-appearing, observe - Recommend daily broad spectrum sunscreen SPF 30+ to sun-exposed areas, reapply every 2 hours as needed. - Call for any changes  Seborrheic Keratoses - Stuck-on, waxy, tan-brown  papules and/or plaques  - Benign-appearing - Discussed benign etiology and prognosis. - Observe - Call for any changes  Telangiectasia At face - Dilated blood vessel - Benign appearing on exam - Call for changes Discussed the treatment option of BBL/laser.  Typically we recommend 1-3 treatment sessions about 5-8 weeks apart for best results.  The patient's condition may require "maintenance treatments" in the future.  The fee for BBL / laser treatments is $350 per treatment session for the whole face.  A fee can be quoted for other parts of the body. Insurance typically does not pay for BBL/laser treatments and therefore the fee is an out-of-pocket cost.  Melanocytic Nevi - Tan-brown and/or pink-flesh-colored symmetric macules and papules - Benign appearing on exam today - Observation - Call clinic for new or changing moles - Recommend daily use of broad spectrum spf 30+ sunscreen to sun-exposed areas.   Hemangiomas - Red papules - Discussed benign nature - Observe - Call for any changes  Acrochordons (Skin Tags) - Fleshy, skin-colored pedunculated papules - Benign appearing.  - Observe. - If desired, they can be removed with an in office procedure that is not covered by insurance. - Please call the clinic if you notice any new or changing lesions.  Actinic Damage - Chronic condition, secondary to cumulative UV/sun exposure - diffuse scaly erythematous macules with underlying dyspigmentation - Recommend daily broad spectrum sunscreen SPF 30+ to sun-exposed areas, reapply every 2 hours as needed.  - Staying in the shade or wearing long sleeves, sun glasses (UVA+UVB protection) and wide brim hats (4-inch brim around the entire circumference of the hat) are also recommended for sun protection.  - Call for new or changing lesions.  History of Squamous Cell Carcinoma of the Skin Superior Perianal Area Biopsy proven SCC in situ arising in a condyloma.  Status post excision by  Dr. Bary Castilla - No evidence of recurrence today - No lymphadenopathy - Recommend regular full body skin exams - Recommend daily broad spectrum sunscreen SPF 30+ to sun-exposed areas, reapply every 2 hours as needed.  - Call if any new or changing lesions are noted between office visits  Skin cancer screening performed today.  Return in about 1 year (around 04/08/2023) for TBSE. IRuthell Rummage, CMA, am acting as scribe for Sarina Ser, MD. Documentation: I have reviewed the above documentation for accuracy and completeness, and I agree with the above.  Sarina Ser, MD

## 2022-04-07 NOTE — Patient Instructions (Addendum)
Recommend Serica moisturizing scar formula cream every night or [facility] brand or Mederma silicone scar sheet every night for the first year after a scar appears to help with scar remodeling if desired. Scars remodel on their own for a full year and will gradually improve in appearance over time.        Seborrheic Keratosis  What causes seborrheic keratoses? Seborrheic keratoses are harmless, common skin growths that first appear during adult life.  As time goes by, more growths appear.  Some people may develop a large number of them.  Seborrheic keratoses appear on both covered and uncovered body parts.  They are not caused by sunlight.  The tendency to develop seborrheic keratoses can be inherited.  They vary in color from skin-colored to gray, brown, or even black.  They can be either smooth or have a rough, warty surface.   Seborrheic keratoses are superficial and look as if they were stuck on the skin.  Under the microscope this type of keratosis looks like layers upon layers of skin.  That is why at times the top layer may seem to fall off, but the rest of the growth remains and re-grows.    Treatment Seborrheic keratoses do not need to be treated, but can easily be removed in the office.  Seborrheic keratoses often cause symptoms when they rub on clothing or jewelry.  Lesions can be in the way of shaving.  If they become inflamed, they can cause itching, soreness, or burning.  Removal of a seborrheic keratosis can be accomplished by freezing, burning, or surgery. If any spot bleeds, scabs, or grows rapidly, please return to have it checked, as these can be an indication of a skin cancer.   Cryotherapy Aftercare  Wash gently with soap and water everyday.   Apply Vaseline and Band-Aid daily until healed.      Melanoma ABCDEs  Melanoma is the most dangerous type of skin cancer, and is the leading cause of death from skin disease.  You are more likely to develop melanoma if you: Have  light-colored skin, light-colored eyes, or red or blond hair Spend a lot of time in the sun Tan regularly, either outdoors or in a tanning bed Have had blistering sunburns, especially during childhood Have a close family member who has had a melanoma Have atypical moles or large birthmarks  Early detection of melanoma is key since treatment is typically straightforward and cure rates are extremely high if we catch it early.   The first sign of melanoma is often a change in a mole or a new dark spot.  The ABCDE system is a way of remembering the signs of melanoma.  A for asymmetry:  The two halves do not match. B for border:  The edges of the growth are irregular. C for color:  A mixture of colors are present instead of an even brown color. D for diameter:  Melanomas are usually (but not always) greater than 63m - the size of a pencil eraser. E for evolution:  The spot keeps changing in size, shape, and color.  Please check your skin once per month between visits. You can use a small mirror in front and a large mirror behind you to keep an eye on the back side or your body.   If you see any new or changing lesions before your next follow-up, please call to schedule a visit.  Please continue daily skin protection including broad spectrum sunscreen SPF 30+ to sun-exposed areas, reapplying  every 2 hours as needed when you're outdoors.   Staying in the shade or wearing long sleeves, sun glasses (UVA+UVB protection) and wide brim hats (4-inch brim around the entire circumference of the hat) are also recommended for sun protection.    Due to recent changes in healthcare laws, you may see results of your pathology and/or laboratory studies on MyChart before the doctors have had a chance to review them. We understand that in some cases there may be results that are confusing or concerning to you. Please understand that not all results are received at the same time and often the doctors may need to  interpret multiple results in order to provide you with the best plan of care or course of treatment. Therefore, we ask that you please give [place] 2 business days to thoroughly review all your results before contacting the office for clarification. Should we see a critical lab result, you will be contacted sooner.   If You Need Anything After Your Visit  If you have any questions or concerns for your doctor, please call our main line at [phone] and press option 4 to reach your doctor's medical assistant. If no one answers, please leave a voicemail as directed and we will return your call as soon as possible. Messages left after 4 pm will be answered the following business day.   You may also send [place] a message via [place]. We typically respond to MyChart messages within 1-2 business days.  For prescription refills, please ask your pharmacy to contact our office. Our fax number is [phone].  If you have an urgent issue when the clinic is closed that cannot wait until the next business day, you can page your doctor at the number below.    Please note that while we do our best to be available for urgent issues outside of office hours, we are not available 24/7.   If you have an urgent issue and are unable to reach [place], you may choose to seek medical care at your doctor's office, retail clinic, urgent care center, or emergency room.  If you have a medical emergency, please immediately call 911 or go to the emergency department.  Pager Numbers  - Dr. Nehemiah Massed: [phone]  - Dr. Laurence Ferrari: [phone]  - Dr. Nicole Kindred: [phone]  In the event of inclement weather, please call our main line at [phone] for an update on the status of any delays or closures.  Dermatology Medication Tips: Please keep the boxes that topical medications come in in order to help keep track of the instructions about where and how to use these. Pharmacies typically print the medication instructions only on the  boxes and not directly on the medication tubes.   If your medication is too expensive, please contact our office at [phone] option 4 or send [place] a message through [place].   We are unable to tell what your co-pay for medications will be in advance as this is different depending on your insurance coverage. However, we may be able to find a substitute medication at lower cost or fill out paperwork to get insurance to cover a needed medication.   If a prior authorization is required to get your medication covered by your insurance company, please allow [place] 1-2 business days to complete this process.  Drug prices often vary depending on where the prescription is filled and some pharmacies may offer cheaper prices.  The website www.goodrx.com contains coupons for medications through different pharmacies. The prices here do not  account for what the cost may be with help from insurance (it may be cheaper with your insurance), but the website can give you the price if you did not use any insurance.  - You can print the associated coupon and take it with your prescription to the pharmacy.  - You may also stop by our office during regular business hours and pick up a GoodRx coupon card.  - If you need your prescription sent electronically to a different pharmacy, notify our office through [facility] or by phone at [phone] option 4.     Si Usted Necesita Algo Despus de Su Visita  Tambin puede enviarnos un mensaje a travs de Pharmacist, community. Por lo general respondemos a los mensajes de MyChart en el transcurso de 1 a 2 das hbiles.  Para renovar recetas, por favor pida a su farmacia que se ponga en contacto con nuestra oficina. Harland Dingwall de fax es [place] [phone].  Si tiene un asunto urgente cuando la clnica est cerrada y que no puede esperar hasta el siguiente da hbil, puede llamar/localizar a su doctor(a) al nmero que aparece a continuacin.   Por favor, tenga en cuenta que  aunque hacemos todo lo posible para estar disponibles para asuntos urgentes fuera del horario de [place], no estamos disponibles las 24 horas del da, los 7 das de la [place].   Si tiene un problema urgente y no puede comunicarse con nosotros, puede optar por buscar atencin mdica  en el consultorio de su doctor(a), en una clnica privada, en un centro de atencin urgente o en una sala de emergencias.  Si tiene Engineering geologist, por favor llame inmediatamente al 911 o vaya a la sala de emergencias.  Nmeros de bper  - Dr. Nehemiah Massed: [phone]  - Dra. Moye: [phone]  - Dra. Nicole Kindred: [phone]  En caso de inclemencias del [place], por favor llame a Johnsie Kindred principal al [phone] para una actualizacin sobre el [place] de cualquier retraso o cierre.  Consejos para la medicacin en dermatologa: Por favor, guarde las cajas en las que vienen los medicamentos de uso tpico para ayudarle a seguir las instrucciones sobre dnde y cmo usarlos. Las farmacias generalmente imprimen las instrucciones del medicamento slo en las cajas y no directamente en los tubos del [place].   Si su medicamento es muy caro, por favor, pngase en contacto con Zigmund Daniel llamando al [phone] y presione la opcin 4 o envenos un mensaje a travs de Pharmacist, community.   No podemos decirle cul ser su copago por los medicamentos por adelantado ya que esto es diferente dependiendo de la cobertura de su seguro. Sin embargo, es posible que podamos encontrar un medicamento sustituto a Electrical engineer un formulario para que el seguro cubra el medicamento que se considera necesario.   Si se requiere una autorizacin previa para que su compaa de seguros [place] su medicamento, por favor permtanos de 1 a 2 das hbiles para completar este proceso.  Los precios de los medicamentos varan con frecuencia dependiendo del Environmental consultant de dnde se surte la receta y alguna farmacias pueden ofrecer precios ms  baratos.  El sitio web www.goodrx.com tiene cupones para medicamentos de Airline pilot. Los precios aqu no tienen en cuenta lo que podra costar con la ayuda del seguro (puede ser ms barato con su seguro), pero el sitio web puede darle el precio si no utiliz Research scientist (physical sciences).  - Puede imprimir el cupn correspondiente y llevarlo con su receta a la farmacia.  -  Tambin puede pasar por nuestra oficina durante el horario de atencin regular y Charity fundraiser una tarjeta de cupones de GoodRx.  - Si necesita que su receta se enve electrnicamente a una farmacia diferente, informe a nuestra oficina a travs de MyChart de [place] o por telfono llamando al [phone] y presione la opcin 4.

## 2022-04-09 ENCOUNTER — Other Ambulatory Visit: Payer: Self-pay | Admitting: Cardiovascular Disease

## 2022-04-09 ENCOUNTER — Other Ambulatory Visit: Payer: Self-pay | Admitting: Family Medicine

## 2022-04-10 ENCOUNTER — Encounter: Payer: Self-pay | Admitting: Dermatology

## 2022-04-11 ENCOUNTER — Other Ambulatory Visit: Payer: Self-pay | Admitting: Orthopedic Surgery

## 2022-04-11 NOTE — Telephone Encounter (Signed)
Refill request Epinephrine Last office visit 11/16/21 Last refill 05/18/21 4 each Upcoming appointment 05/24/22

## 2022-04-13 ENCOUNTER — Encounter: Payer: Self-pay | Admitting: Orthopedic Surgery

## 2022-04-13 ENCOUNTER — Other Ambulatory Visit: Payer: Self-pay

## 2022-04-19 ENCOUNTER — Ambulatory Visit
Admission: RE | Admit: 2022-04-19 | Discharge: 2022-04-19 | Disposition: A | Discharge status: Home / Self Care | Payer: BC Managed Care – PPO | Source: Ambulatory Visit | Attending: Orthopedic Surgery | Admitting: Orthopedic Surgery

## 2022-04-19 DIAGNOSIS — G8929 Other chronic pain: Secondary | ICD-10-CM | POA: Insufficient documentation

## 2022-04-19 DIAGNOSIS — M25512 Pain in left shoulder: Secondary | ICD-10-CM | POA: Diagnosis not present

## 2022-04-19 DIAGNOSIS — S46012A Strain of muscle(s) and tendon(s) of the rotator cuff of left shoulder, initial encounter: Secondary | ICD-10-CM | POA: Diagnosis not present

## 2022-04-21 ENCOUNTER — Ambulatory Visit
Admission: RE | Admit: 2022-04-21 | Discharge: 2022-04-21 | Disposition: A | Discharge status: Home / Self Care | Payer: BC Managed Care – PPO | Source: Ambulatory Visit | Attending: Acute Care | Admitting: Acute Care

## 2022-04-21 DIAGNOSIS — Z87891 Personal history of nicotine dependence: Secondary | ICD-10-CM | POA: Diagnosis not present

## 2022-04-22 ENCOUNTER — Ambulatory Visit: Payer: BC Managed Care – PPO | Admitting: Anesthesiology

## 2022-04-22 ENCOUNTER — Encounter
Admission: RE | Disposition: A | Discharge status: Home / Self Care | Payer: Self-pay | Source: Ambulatory Visit | Attending: Orthopedic Surgery

## 2022-04-22 ENCOUNTER — Other Ambulatory Visit: Payer: Self-pay

## 2022-04-22 ENCOUNTER — Ambulatory Visit
Admission: RE | Admit: 2022-04-22 | Discharge: 2022-04-22 | Disposition: A | Discharge status: Home / Self Care | Payer: BC Managed Care – PPO | Source: Ambulatory Visit | Attending: Orthopedic Surgery | Admitting: Orthopedic Surgery

## 2022-04-22 ENCOUNTER — Encounter: Payer: Self-pay | Admitting: Orthopedic Surgery

## 2022-04-22 DIAGNOSIS — M7581 Other shoulder lesions, right shoulder: Secondary | ICD-10-CM | POA: Diagnosis not present

## 2022-04-22 DIAGNOSIS — M75102 Unspecified rotator cuff tear or rupture of left shoulder, not specified as traumatic: Secondary | ICD-10-CM | POA: Diagnosis not present

## 2022-04-22 DIAGNOSIS — M19012 Primary osteoarthritis, left shoulder: Secondary | ICD-10-CM | POA: Diagnosis not present

## 2022-04-22 DIAGNOSIS — M7542 Impingement syndrome of left shoulder: Secondary | ICD-10-CM | POA: Diagnosis not present

## 2022-04-22 DIAGNOSIS — M7521 Bicipital tendinitis, right shoulder: Secondary | ICD-10-CM | POA: Diagnosis not present

## 2022-04-22 DIAGNOSIS — E669 Obesity, unspecified: Secondary | ICD-10-CM | POA: Diagnosis not present

## 2022-04-22 DIAGNOSIS — M25812 Other specified joint disorders, left shoulder: Secondary | ICD-10-CM | POA: Insufficient documentation

## 2022-04-22 DIAGNOSIS — Z6836 Body mass index (BMI) 36.0-36.9, adult: Secondary | ICD-10-CM | POA: Insufficient documentation

## 2022-04-22 DIAGNOSIS — Z87891 Personal history of nicotine dependence: Secondary | ICD-10-CM | POA: Insufficient documentation

## 2022-04-22 DIAGNOSIS — K219 Gastro-esophageal reflux disease without esophagitis: Secondary | ICD-10-CM | POA: Insufficient documentation

## 2022-04-22 DIAGNOSIS — M7541 Impingement syndrome of right shoulder: Secondary | ICD-10-CM | POA: Diagnosis not present

## 2022-04-22 DIAGNOSIS — M7522 Bicipital tendinitis, left shoulder: Secondary | ICD-10-CM | POA: Insufficient documentation

## 2022-04-22 DIAGNOSIS — E119 Type 2 diabetes mellitus without complications: Secondary | ICD-10-CM | POA: Insufficient documentation

## 2022-04-22 DIAGNOSIS — I10 Essential (primary) hypertension: Secondary | ICD-10-CM | POA: Insufficient documentation

## 2022-04-22 DIAGNOSIS — I2510 Atherosclerotic heart disease of native coronary artery without angina pectoris: Secondary | ICD-10-CM | POA: Diagnosis not present

## 2022-04-22 DIAGNOSIS — Z955 Presence of coronary angioplasty implant and graft: Secondary | ICD-10-CM | POA: Insufficient documentation

## 2022-04-22 DIAGNOSIS — M75122 Complete rotator cuff tear or rupture of left shoulder, not specified as traumatic: Secondary | ICD-10-CM | POA: Diagnosis not present

## 2022-04-22 DIAGNOSIS — M19011 Primary osteoarthritis, right shoulder: Secondary | ICD-10-CM | POA: Diagnosis not present

## 2022-04-22 HISTORY — PX: SHOULDER ARTHROSCOPY WITH OPEN ROTATOR CUFF REPAIR: SHX6092

## 2022-04-22 LAB — GLUCOSE, CAPILLARY
Glucose-Capillary: 129 mg/dL — ABNORMAL HIGH (ref 70–99)
Glucose-Capillary: 141 mg/dL — ABNORMAL HIGH (ref 70–99)

## 2022-04-22 SURGERY — ARTHROSCOPY, SHOULDER WITH REPAIR, ROTATOR CUFF, OPEN
Anesthesia: Regional | Site: Shoulder | Laterality: Left

## 2022-04-22 MED ORDER — ASPIRIN 325 MG PO TBEC: 325.0000 mg | DELAYED_RELEASE_TABLET | Freq: Every day | ORAL | 0 refills | Status: AC

## 2022-04-22 MED ORDER — PROPOFOL 10 MG/ML IV BOLUS
INTRAVENOUS | Status: DC | PRN
  Administered 2022-04-22: 200 mg via INTRAVENOUS

## 2022-04-22 MED ORDER — LIDOCAINE HCL (CARDIAC) PF 100 MG/5ML IV SOSY
PREFILLED_SYRINGE | INTRAVENOUS | Status: DC | PRN
  Administered 2022-04-22: 100 mg via INTRATRACHEAL

## 2022-04-22 MED ORDER — OXYCODONE HCL 5 MG PO TABS: 5.0000 mg | ORAL_TABLET | ORAL | 0 refills | Status: AC | PRN

## 2022-04-22 MED ORDER — ONDANSETRON HCL 4 MG/2ML IJ SOLN
INTRAMUSCULAR | Status: DC | PRN
  Administered 2022-04-22: 4 mg via INTRAVENOUS

## 2022-04-22 MED ORDER — PROMETHAZINE HCL 25 MG/ML IJ SOLN: 6.2500 mg | INTRAMUSCULAR | Status: DC | PRN

## 2022-04-22 MED ORDER — DEXAMETHASONE SODIUM PHOSPHATE 4 MG/ML IJ SOLN
INTRAMUSCULAR | Status: DC | PRN
  Administered 2022-04-22: 8 mg via INTRAVENOUS

## 2022-04-22 MED ORDER — FENTANYL CITRATE (PF) 100 MCG/2ML IJ SOLN
INTRAMUSCULAR | Status: DC | PRN
  Administered 2022-04-22 (×3): 50 ug via INTRAVENOUS

## 2022-04-22 MED ORDER — FENTANYL CITRATE PF 50 MCG/ML IJ SOSY: 25.0000 ug | PREFILLED_SYRINGE | INTRAMUSCULAR | Status: DC | PRN

## 2022-04-22 MED ORDER — BUPIVACAINE LIPOSOME 1.3 % IJ SUSP
INTRAMUSCULAR | Status: DC | PRN
  Administered 2022-04-22: 20 mL via PERINEURAL

## 2022-04-22 MED ORDER — GLYCOPYRROLATE 0.2 MG/ML IJ SOLN
INTRAMUSCULAR | Status: DC | PRN
  Administered 2022-04-22: .2 mg via INTRAVENOUS

## 2022-04-22 MED ORDER — BUPIVACAINE HCL (PF) 0.5 % IJ SOLN
INTRAMUSCULAR | Status: DC | PRN
  Administered 2022-04-22: 10 mL via PERINEURAL

## 2022-04-22 MED ORDER — LACTATED RINGERS IV SOLN: INTRAVENOUS | Status: DC

## 2022-04-22 MED ORDER — LACTATED RINGERS IV SOLN
INTRAVENOUS | Status: DC | PRN
  Administered 2022-04-22: 12000 mL

## 2022-04-22 MED ORDER — MIDAZOLAM HCL 5 MG/5ML IJ SOLN
INTRAMUSCULAR | Status: DC | PRN
  Administered 2022-04-22: 1 mg via INTRAVENOUS
  Administered 2022-04-22: 2 mg via INTRAVENOUS
  Administered 2022-04-22: 1 mg via INTRAVENOUS

## 2022-04-22 MED ORDER — LIDOCAINE HCL (PF) 1 % IJ SOLN
INTRAMUSCULAR | Status: DC | PRN
  Administered 2022-04-22: 3 mL via SUBCUTANEOUS

## 2022-04-22 MED ORDER — ONDANSETRON 4 MG PO TBDP: 4.0000 mg | ORAL_TABLET | Freq: Three times a day (TID) | ORAL | 0 refills | Status: AC | PRN

## 2022-04-22 MED ORDER — EPHEDRINE SULFATE (PRESSORS) 50 MG/ML IJ SOLN
INTRAMUSCULAR | Status: DC | PRN
  Administered 2022-04-22: 10 mg via INTRAVENOUS

## 2022-04-22 MED ORDER — LACTATED RINGERS IR SOLN
Status: DC | PRN
  Administered 2022-04-22 (×2): 6000 mL
  Administered 2022-04-22: 3000 mL

## 2022-04-22 MED ORDER — CEFAZOLIN SODIUM-DEXTROSE 2-4 GM/100ML-% IV SOLN
2.0000 g | INTRAVENOUS | Status: AC
  Administered 2022-04-22 (×2): 2 g via INTRAVENOUS

## 2022-04-22 MED ORDER — ACETAMINOPHEN 500 MG PO TABS: 1000.0000 mg | ORAL_TABLET | Freq: Three times a day (TID) | ORAL | 2 refills | Status: AC

## 2022-04-22 SURGICAL SUPPLY — 53 items
ADH SKN CLS APL DERMABOND .7 (GAUZE/BANDAGES/DRESSINGS) ×1
ADPR IRR PORT MULTIBAG TUBE (MISCELLANEOUS) ×1
ANCH SUT 2 SWLK 19.1 CLS EYLT (Anchor) ×1 IMPLANT
ANCH SUT 2.9 PUSHLOCK ANCH (Orthopedic Implant) ×1 IMPLANT
ANCH SUT 2X2.3 TAPE (Anchor) ×1 IMPLANT
ANCHOR 2.3 SP SGL 1.2 XBRAID (Anchor) IMPLANT
ANCHOR SWIVELOCK BIO 4.75X19.1 (Anchor) IMPLANT
APL PRP STRL LF DISP 70% ISPRP (MISCELLANEOUS) ×1
BLADE SHAVER 4.5X7 STR FR (MISCELLANEOUS) ×1 IMPLANT
BUR BR 5.5 WIDE MOUTH (BURR) ×1 IMPLANT
CANNULA PART THRD DISP 5.75X7 (CANNULA) ×1 IMPLANT
CANNULA PARTIAL THREAD 2X7 (CANNULA) ×1 IMPLANT
CANNULA TWIST IN 8.25X7CM (CANNULA) IMPLANT
CHLORAPREP W/TINT 26 (MISCELLANEOUS) ×1 IMPLANT
COOLER POLAR GLACIER W/PUMP (MISCELLANEOUS) ×1 IMPLANT
COVER LIGHT HANDLE UNIVERSAL (MISCELLANEOUS) ×2 IMPLANT
DERMABOND ADVANCED .7 DNX12 (GAUZE/BANDAGES/DRESSINGS) ×1 IMPLANT
DRAPE INCISE IOBAN 66X45 STRL (DRAPES) ×1 IMPLANT
DRAPE U-SHAPE 48X52 POLY STRL (PACKS) ×1 IMPLANT
DRSG TEGADERM 4X4.75 (GAUZE/BANDAGES/DRESSINGS) ×3 IMPLANT
ELECT REM PT RETURN 9FT ADLT (ELECTROSURGICAL) ×1
ELECTRODE REM PT RTRN 9FT ADLT (ELECTROSURGICAL) ×1 IMPLANT
GAUZE SPONGE 4X4 12PLY STRL (GAUZE/BANDAGES/DRESSINGS) ×1 IMPLANT
GAUZE XEROFORM 1X8 LF (GAUZE/BANDAGES/DRESSINGS) ×1 IMPLANT
GLOVE SRG 8 PF TXTR STRL LF DI (GLOVE) ×3 IMPLANT
GLOVE SURG ENC MOIS LTX SZ7.5 (GLOVE) ×5 IMPLANT
GLOVE SURG UNDER POLY LF SZ8 (GLOVE) ×3
GOWN STRL REIN 2XL XLG LVL4 (GOWN DISPOSABLE) ×1 IMPLANT
GOWN STRL REUS W/ TWL LRG LVL3 (GOWN DISPOSABLE) ×3 IMPLANT
GOWN STRL REUS W/TWL LRG LVL3 (GOWN DISPOSABLE) ×3
IV LACTATED RINGER IRRG 3000ML (IV SOLUTION) ×9
IV LR IRRIG 3000ML ARTHROMATIC (IV SOLUTION) ×6 IMPLANT
KIT STABILIZATION SHOULDER (MISCELLANEOUS) ×1 IMPLANT
KIT TURNOVER KIT A (KITS) ×1 IMPLANT
MANIFOLD 4PT FOR NEPTUNE1 (MISCELLANEOUS) ×1 IMPLANT
MASK FACE SPIDER DISP (MASK) ×1 IMPLANT
MAT ABSORB  FLUID 56X50 GRAY (MISCELLANEOUS) ×2
MAT ABSORB FLUID 56X50 GRAY (MISCELLANEOUS) ×2 IMPLANT
PACK ARTHROSCOPY SHOULDER (MISCELLANEOUS) ×1 IMPLANT
PAD ABD DERMACEA PRESS 5X9 (GAUZE/BANDAGES/DRESSINGS) ×2 IMPLANT
PAD WRAPON POLAR SHDR XLG (MISCELLANEOUS) ×1 IMPLANT
PASSER SUT FIRSTPASS SELF (INSTRUMENTS) IMPLANT
SET Y ADAPTER MULIT-BAG IRRIG (MISCELLANEOUS) ×2 IMPLANT
SUT ETHILON 3-0 (SUTURE) ×1 IMPLANT
SUT PDS AB 0 CT1 27 (SUTURE) IMPLANT
SUT PROLENE 0 CT 2 (SUTURE) ×1 IMPLANT
SYSTEM IMPL TENODESIS LNT 2.9 (Orthopedic Implant) IMPLANT
TAPE MICROFOAM 4IN (TAPE) ×1 IMPLANT
TUBING CONNECTING 10 (TUBING) ×1 IMPLANT
TUBING INFLOW SET DBFLO PUMP (TUBING) ×1 IMPLANT
TUBING OUTFLOW SET DBLFO PUMP (TUBING) ×1 IMPLANT
WAND WEREWOLF FLOW 90D (MISCELLANEOUS) ×1 IMPLANT
WRAPON POLAR PAD SHDR XLG (MISCELLANEOUS) ×1

## 2022-04-22 NOTE — Anesthesia Preprocedure Evaluation (Signed)
Anesthesia Evaluation  Patient identified by MRN, date of birth, ID band Patient awake    Reviewed: Allergy & Precautions, NPO status   History of Anesthesia Complications Negative for: history of anesthetic complications  Airway Mallampati: II  TM Distance: >3 FB     Dental  (+) Dental Advidsory Given, Poor Dentition   Pulmonary neg pulmonary ROS, Patient abstained from smoking., former smoker,    Pulmonary exam normal        Cardiovascular Exercise Tolerance: Good hypertension, (-) angina+ CAD and + Cardiac Stents (x 2)  (-) CABG (-) dysrhythmias (-) Valvular Problems/Murmurs Rhythm:Regular Rate:Normal  HLD   Neuro/Psych negative neurological ROS  negative psych ROS   GI/Hepatic Neg liver ROS, GERD  ,  Endo/Other  diabetes, Type 2Obesity - BMI > 35  Renal/GU negative Renal ROS     Musculoskeletal  (+) Arthritis ,   Abdominal   Peds  Hematology   Anesthesia Other Findings Past Medical History: No date: Anginal pain ([place]) No date: Arthritis 2008: Coronary artery disease     Comment:  2 stents  No date: Diabetes type 2, controlled ([place]) 2009, 2012, 2015: Diverticulitis     Comment:  recurrent episodes No date: GERD (gastroesophageal reflux disease) 09/2013: Glaucoma suspect     Comment:  Dr. Wallace Going No date: History of chicken pox 06/2015: History of pneumonia No date: Hyperlipidemia No date: Hypertension 02/29/2020: Salmonella gastroenteritis 01/20/2020: Squamous cell carcinoma of skin     Comment:  Superior perianal area. 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   Reproductive/Obstetrics negative OB ROS                             Anesthesia Physical  Anesthesia Plan  ASA: 3  Anesthesia Plan: General and Regional   Post-op Pain Management: Regional block   Induction: Intravenous  PONV Risk Score and Plan: 2 and Treatment may vary due  to age or medical condition, Ondansetron, Dexamethasone and Midazolam  Airway Management Planned: LMA  Additional Equipment:   Intra-op Plan:   Post-operative Plan: Extubation in OR  Informed Consent: I have reviewed the patients History and Physical, chart, labs and discussed the procedure including the risks, benefits and alternatives for the proposed anesthesia with the patient or authorized representative who has indicated his/her understanding and acceptance.     Dental advisory given  Plan Discussed with: CRNA  Anesthesia Plan Comments:         Anesthesia Quick Evaluation

## 2022-04-22 NOTE — Anesthesia Procedure Notes (Signed)
Procedure Name: LMA Insertion Date/Time: 04/22/2022 11:33 AM  Performed by: Glee Arvin, CRNAPre-anesthesia Checklist: Patient identified, Emergency Drugs available, Suction available and Patient being monitored Patient Re-evaluated:Patient Re-evaluated prior to induction Oxygen Delivery Method: Circle system utilized Preoxygenation: Pre-oxygenation with 100% oxygen Induction Type: IV induction Ventilation: Mask ventilation without difficulty LMA: LMA inserted LMA Size: 5.0 Tube type: Oral Number of attempts: 1 Airway Equipment and Method: Stylet and Oral airway Placement Confirmation: positive ETCO2 and breath sounds checked- equal and bilateral Tube secured with: Tape Dental Injury: Teeth and Oropharynx as per pre-operative assessment

## 2022-04-22 NOTE — Transfer of Care (Signed)
Immediate Anesthesia Transfer of Care Note  Patient: Eric [name]  Procedure(s) Performed: Left shoulder rotator cuff repair, biceps tenodesis, distal clavicle excision, subacromial decompression (Left: Shoulder)  Patient Location: PACU  Anesthesia Type: General, Regional  Level of Consciousness: awake, alert  and patient cooperative  Airway and Oxygen Therapy: Patient Spontanous Breathing and Patient connected to supplemental oxygen  Post-op Assessment: Post-op Vital signs reviewed, Patient's Cardiovascular Status Stable, Respiratory Function Stable, Patent Airway and No signs of Nausea or vomiting  Post-op Vital Signs: Reviewed and stable  Complications: No notable events documented.

## 2022-04-22 NOTE — Anesthesia Procedure Notes (Signed)
Anesthesia Regional Block: Interscalene brachial plexus block   Pre-Anesthetic Checklist: , timeout performed,  Correct Patient, Correct Site, Correct Laterality,  Correct Procedure, Correct Position, site marked,  Risks and benefits discussed,  Surgical consent,  Pre-op evaluation,  At surgeon's request and post-op pain management  Laterality: Left and Upper  Prep: chloraprep       Needles:  Injection technique: Single-shot  Needle Type: Stimiplex     Needle Length: 5cm  Needle Gauge: 22     Additional Needles:   Procedures:,,,, ultrasound used (permanent image in chart),,    Narrative:  Start time: 04/22/2022 10:13 AM End time: 04/22/2022 10:16 AM Injection made incrementally with aspirations every 5 mL.  Performed by: Personally  Anesthesiologist: Martha Clan, MD  Additional Notes: Functioning IV was confirmed and monitors were applied.  A 24m 22ga Stimuplex needle was used. Sterile prep and drape,hand hygiene and sterile gloves were used.  Negative aspiration and negative test dose prior to incremental administration of local anesthetic. The patient tolerated the procedure well.

## 2022-04-22 NOTE — H&P (Signed)
Paper H&P to be scanned into permanent record. H&P reviewed. No significant changes noted.  

## 2022-04-22 NOTE — Op Note (Signed)
SURGERY DATE: 04/22/2022   PRE-OP DIAGNOSIS:  1. Left subacromial impingement 2. Left biceps tendinopathy 3. Left rotator cuff tear 4. Left acromioclavicular joint arthritis  POST-OP DIAGNOSIS: 1. Left subacromial impingement 2. Left biceps tendinopathy 3. Left rotator cuff tear 4. Left acromioclavicular joint arthritis  PROCEDURES:  1. Left arthroscopic rotator cuff repair 2. Left arthroscopic biceps tenodesis 3. Left arthroscopic subacromial decompression 4. Left arthroscopic extensive debridement of shoulder (glenohumeral and subacromial spaces) 5. Left arthroscopic distal clavicle excision   SURGEON: Cato Mulligan, MD   ASSISTANT: Anitra Lauth, PA; Evette Georges, PA-S    ANESTHESIA: Gen with Exparel interscalene block   ESTIMATED BLOOD LOSS: 5cc   DRAINS:  none   TOTAL IV FLUIDS: per anesthesia      SPECIMENS: none   IMPLANTS:  - Arthrex 2.3m PushLock x 1 - Arthrex 4.754mSwiveLock x 1 - Iconix SPEED double loaded with 1.2 and 2.55m11mape x 1     OPERATIVE FINDINGS:  Examination under anesthesia: A careful examination under anesthesia was performed.  Passive range of motion was: FF: 150; ER at side: 45; ER in abduction: 90; IR in abduction: 45.  Anterior load shift: NT.  Posterior load shift: NT.  Sulcus in neutral: NT.  Sulcus in ER: NT.     Intra-operative findings: A thorough arthroscopic examination of the shoulder was performed.  The findings are: 1. Biceps tendon: tendinopathy with significant erythema and split-thickness tearing adjacent to biceps anchor 2. Superior labrum: erythema 3. Posterior labrum and capsule: normal 4. Inferior capsule and inferior recess: normal 5. Glenoid cartilage surface: Normal 6. Supraspinatus attachment: High-grade partial-thickness tear of the articular surface involving over 50% of the footprint 7. Posterior rotator cuff attachment: normal 8. Humeral head articular cartilage: normal 9. Rotator interval: significant  synovitis 10: Subscapularis tendon: attachment intact 11. Anterior labrum: Mildly degenerative 12. IGHL: normal   OPERATIVE REPORT:    Indications for procedure:  Eric [name] a [age]o. male with with over 1 year of left shoulder pain.  He has had some aching pain with overhead activity. Clinical exam and MRI were suggestive of high grade partial-thickness rotator cuff tear, biceps tendinopathy/tear, acromioclavicular joint arthritis and subacromial impingement.  He failed extensive nonoperative management consisting of activity modifications, medications, exercises, and corticosteroid injections.  After discussion of risks, benefits, and alternatives to surgery, the patient elected to proceed.  Of note, he has had right shoulder arthroscopic rotator cuff repair done by me on 10/12/2021 and has been doing well from that surgery.   Procedure in detail:   I identified Eric [name] the pre-operative holding area.  I marked the operative shoulder with my initials. I reviewed the risks and benefits of the proposed surgical intervention, and the patient wished to proceed.  Anesthesia was then performed with an Exparel interscalene block.  The patient was transferred to the operative suite and placed in the beach chair position.     Appropriate IV antibiotics were administered prior to incision. The operative upper extremity was then prepped and draped in standard fashion. A time out was performed confirming the correct extremity, correct patient, and correct procedure.    I then created a standard posterior portal with an 11 blade. The glenohumeral joint was easily entered with a blunt trocar and the arthroscope introduced. The findings of diagnostic arthroscopy are described above. I debrided degenerative tissue including the synovitic tissue about the rotator interval and anterior and superior labrum. I then coagulated the inflamed  synovium to obtain hemostasis and reduce the risk of  post-operative swelling using an Arthrocare radiofrequency device.   I then turned my attention to the arthroscopic biceps tenodesis.  The left tearing of the biceps was debrided with an arthroscopic shaver.  The Loop n Tack technique was used to pass a FiberTape through the biceps in a locked fashion adjacent to the biceps anchor.  A hole for a 2.9 mm Arthrex PushLock was drilled in the bicipital groove just superior to the subscapularis tendon insertion.  The biceps tendon was then cut and the biceps anchor complex was debrided down to a stable base on the superior labrum.  The FiberTape was loaded onto the PushLock anchor and impacted into place into the previously drilled hole in the bicipital groove.  This appropriately secured the biceps into the bicipital groove and took it off of tension.   Next, the arthroscope was then introduced into the subacromial space. A direct lateral portal was created with an 11-blade after spinal needle localization.  There was severe bursitis.  An extensive subacromial bursectomy and debridement was performed using a combination of the shaver and Arthrocare wand. The entire acromial undersurface was exposed and the CA ligament was subperiosteally elevated to expose the anterior acromial hook. A burr was used to create a flat anterior and lateral aspect of the acromion, converting it from a Type 2 to a Type 1 acromion. Care was made to keep the deltoid fascia intact.  I then turned my attention to the arthroscopic distal clavicle excision. I identified the acromioclavicular joint. Surrounding bursal tissue was debrided and the edges of the joint were identified. I used the 5.12m barrel burr to remove the distal clavicle parallel to the edge of the acromion. I was able to fit two widths of the burr into the space between the distal clavicle and acromion, signifying that I had removed ~123mof distal clavicle. This was confirmed by viewing anteriorly and introducing a probe  with measuring marks from the lateral portal. Hemostasis was achieved with an Arthrocare wand.    Next, I created an accessory posterolateral portal to assist with visualization and instrumentation.  The rotator cuff was visibly thinned and I could easily push a switching stick through the remnant fibers of the supraspinatus into the glenohumeral joint.  Unhealthy fibers were debrided using an oscillating shaver.  I prepared the footprint using a burr to expose bleeding bone.    I then percutaneously placed 1 Iconix SPEED medial row anchor at the articular margin. I then shuttled all 4 strands of tape through the rotator cuff just lateral to the musculotendinous junction using a FirstPass suture passer spanning the anterior to posterior extent of the tear. All 4 strands of suture were passed through an ArHCA Incnchor.  This was placed approximately 2 cm distal to the lateral edge of the footprint in line with the midportion of the tear with appropriate tensioning of each suture prior to final fixation. This construct allowed for excellent reapproximation of the rotator cuff to its native footprint without undue tension.  Appropriate compression was achieved. The repair was stable to external and internal rotation.   Fluid was evacuated from the shoulder, and the portals were closed with 3-0 Nylon. Xeroform was applied to the portals. A sterile dressing was applied, followed by a Polar Care sleeve and a SlingShot shoulder immobilizer/sling. The patient was awakened from anesthesia without difficulty and was transferred to the PACU in stable condition.    Of note,  assistance from a PA was essential to performing the surgery.  PA was present for the entire surgery.  PA assisted with patient positioning, retraction, instrumentation, and wound closure. The surgery would have been more difficult and had longer operative time without PA assistance.   COMPLICATIONS: none   DISPOSITION: plan for  discharge home after recovery in PACU     POSTOPERATIVE PLAN: Remain in sling (except hygiene and elbow/wrist/hand RoM exercises as instructed by PT) x 4 weeks and NWB for this time. PT to begin 3-4 days after surgery.  Small/medium rotator cuff repair rehab protocol. ASA '325mg'$  daily x 2 weeks for DVT ppx.

## 2022-04-22 NOTE — Discharge Instructions (Addendum)
Post-Op Instructions - Rotator Cuff Repair  1. Bracing: You will wear a shoulder immobilizer or sling for 4 weeks.   2. Driving: No driving for 4 weeks post-op.   3. Activity: No active lifting for 2 months. Wrist, hand, and elbow motion only. Avoid lifting the upper arm away from the body except for hygiene. You are permitted to bend and straighten the elbow passively only (no active elbow motion). You may use your hand and wrist for typing, writing, and managing utensils (cutting food). Do not lift more than a coffee cup for 8 weeks.  When sleeping or resting, inclined positions (recliner chair or wedge pillow) and a pillow under the forearm for support may provide better comfort for up to 4 weeks.  Avoid long distance travel for 4 weeks.  Return to normal activities after rotator cuff repair repair normally takes 6 months on average. If rehab goes very well, may be able to do most activities at 4 months, except overhead or contact sports.  4. Physical Therapy: Begins 3-4 days after surgery, and proceed 1 time per week for the first 6 weeks, then 1-2 times per week from weeks 6-20 post-op.  5. Medications:  - You will be provided a prescription for narcotic pain medicine. After surgery, take 1-2 narcotic tablets every 4 hours if needed for severe pain.  - A prescription for anti-nausea medication will be provided in case the narcotic medicine causes nausea - take 1 tablet every 6 hours only if nauseated.   - Take tylenol 1000 mg (2 Extra Strength tablets or 3 regular strength) every 8 hours for pain.  May decrease or stop tylenol 5 days after surgery if you are having minimal pain. - Take ASA '325mg'$ /day x 2 weeks to help prevent DVTs/PEs (blood clots).  - DO NOT take ANY nonsteroidal anti-inflammatory pain medications (Advil, Motrin, Ibuprofen, Aleve, Naproxen, or Naprosyn). These medicines can inhibit healing of your shoulder repair.    If you are taking prescription medication for anxiety,  depression, insomnia, muscle spasm, chronic pain, or for attention deficit disorder, you are advised that you are at a higher risk of adverse effects with use of narcotics post-op, including narcotic addiction/dependence, depressed breathing, death. If you use non-prescribed substances: alcohol, marijuana, cocaine, heroin, methamphetamines, etc., you are at a higher risk of adverse effects with use of narcotics post-op, including narcotic addiction/dependence, depressed breathing, death. You are advised that taking > 50 morphine milligram equivalents (MME) of narcotic pain medication per day results in twice the risk of overdose or death. For your prescription provided: oxycodone 5 mg - taking more than 6 tablets per day would result in > 50 morphine milligram equivalents (MME) of narcotic pain medication. Be advised that we will prescribe narcotics short-term, for acute post-operative pain only - 3 weeks for major operations such as shoulder repair/reconstruction surgeries.     6. Post-Op Appointment:  Your first post-op appointment will be 10-14 days post-op.  7. Work or School: For most, but not all procedures, we advise staying out of work or school for at least 1 to 2 weeks in order to recover from the stress of surgery and to allow time for healing.   If you need a work or school note this can be provided.   8. Smoking: If you are a smoker, you need to refrain from smoking in the postoperative period. The nicotine in cigarettes will inhibit healing of your shoulder repair and decrease the chance of successful repair. Similarly, nicotine containing  products (gum, patches) should be avoided.   Post-operative Brace: Apply and remove the brace you received as you were instructed to at the time of fitting and as described in detail as the brace's instructions for use indicate.  Wear the brace for the period of time prescribed by your physician.  The brace can be cleaned with soap and water and  allowed to air dry only.  Should the brace result in increased pain, decreased feeling (numbness/tingling), increased swelling or an overall worsening of your medical condition, please contact your doctor immediately.  If an emergency situation occurs as a result of wearing the brace after normal business hours, please dial 911 and seek immediate medical attention.  Let your doctor know if you have any further questions about the brace issued to you. Refer to the shoulder sling instructions for use if you have any questions regarding the correct fit of your shoulder sling.  [place] for Troubleshooting: [phone]  Video that illustrates how to properly use a shoulder sling: "Instructions for Proper Use of an Orthopaedic Sling" ShoppingLesson.hu         POLAR CARE INFORMATION  http://jones.com/  How to use [place]?  YouTube   BargainHeads.tn  OPERATING INSTRUCTIONS  Start the product With dry hands, connect the transformer to the electrical connection located on the top of the cooler. Next, plug the transformer into an appropriate electrical outlet. The unit will automatically start running at this point.  To stop the pump, disconnect electrical power.  Unplug to stop the product when not in use. Unplugging the Polar Care unit turns it off. Always unplug immediately after use. Never leave it plugged in while unattended. Remove pad.    FIRST ADD WATER TO FILL LINE, THEN ICE---Replace ice when existing ice is almost melted  1 Discuss Treatment with your [facility] Practitioner and Use Only as Prescribed 2 Apply Insulation Barrier & Cold Therapy Pad 3 Check for Moisture 4 Inspect Skin Regularly  Tips and Trouble Shooting Usage Tips 1. Use cubed or chunked ice for optimal performance. 2. It is recommended to drain the Pad between uses. To drain the pad, hold the Pad upright  with the hose pointed toward the ground. Depress the black plunger and allow water to drain out. 3. You may disconnect the Pad from the unit without removing the pad from the affected area by depressing the silver tabs on the hose coupling and gently pulling the hoses apart. The Pad and unit will seal itself and will not leak. Note: Some dripping during release is normal. 4. DO NOT RUN PUMP WITHOUT WATER! The pump in this unit is designed to run with water. Running the unit without water will cause permanent damage to the pump. 5. Unplug unit before removing lid.  TROUBLESHOOTING GUIDE Pump not running, Water not flowing to the pad, Pad is not getting cold 1. Make sure the transformer is plugged into the wall outlet. 2. Confirm that the ice and water are filled to the indicated levels. 3. Make sure there are no kinks in the pad. 4. Gently pull on the blue tube to make sure the tube/pad junction is straight. 5. Remove the pad from the treatment site and ll it while the pad is lying at; then reapply. 6. Confirm that the pad couplings are securely attached to the unit. Listen for the double clicks (Figure 1) to confirm the pad couplings are securely attached.  Leaks  Note: Some condensation on the lines, controller, and pads is unavoidable, especially in warmer climates. 1. If using a [place] Polar Care Cold Therapy unit with a detachable Cold Therapy Pad, and a leak exists (other than condensation on the lines) disconnect the pad couplings. Make sure the silver tabs on the couplings are depressed before reconnecting the pad to the pump hose; then confirm both sides of the coupling are properly clicked in. 2. If the coupling continues to leak or a leak is detected in the pad itself, stop using it and call [facility] at [phone].  Cleaning After use, empty and dry the unit with a soft cloth. Warm water and mild detergent may be used occasionally to clean the pump and tubes.  WARNING:  The [place] can be cold enough to cause serious injury, including full skin necrosis. Follow these Operating Instructions, and carefully read the Product Insert (see pouch on side of unit) and the Cold Therapy Pad Fitting Instructions (provided with each Cold Therapy Pad) prior to use.

## 2022-04-22 NOTE — Progress Notes (Signed)
Assisted Dr Rosey Bath with left, interscalene , ultrasound guided block. Side rails up, monitors on throughout procedure. See vital signs in flow sheet. Tolerated Procedure well.

## 2022-04-22 NOTE — Anesthesia Postprocedure Evaluation (Signed)
Anesthesia Post Note  Patient: Eric [name]  Procedure(s) Performed: Left shoulder rotator cuff repair, biceps tenodesis, distal clavicle excision, subacromial decompression (Left: Shoulder)     Patient location during evaluation: PACU Anesthesia Type: Regional Level of consciousness: awake and alert Pain management: pain level controlled Vital Signs Assessment: post-procedure vital signs reviewed and stable Respiratory status: spontaneous breathing, nonlabored ventilation, respiratory function stable and patient connected to nasal cannula oxygen Cardiovascular status: blood pressure returned to baseline and stable Postop Assessment: no apparent nausea or vomiting Anesthetic complications: no   No notable events documented.  Martha Clan

## 2022-04-25 ENCOUNTER — Encounter: Payer: Self-pay | Admitting: Orthopedic Surgery

## 2022-04-25 ENCOUNTER — Other Ambulatory Visit: Payer: Self-pay | Admitting: Acute Care

## 2022-04-25 DIAGNOSIS — Z122 Encounter for screening for malignant neoplasm of respiratory organs: Secondary | ICD-10-CM

## 2022-04-25 DIAGNOSIS — Z87891 Personal history of nicotine dependence: Secondary | ICD-10-CM

## 2022-05-03 ENCOUNTER — Telehealth: Payer: Self-pay | Admitting: Family Medicine

## 2022-05-03 DIAGNOSIS — H40003 Preglaucoma, unspecified, bilateral: Secondary | ICD-10-CM | POA: Diagnosis not present

## 2022-05-03 DIAGNOSIS — Z1211 Encounter for screening for malignant neoplasm of colon: Secondary | ICD-10-CM

## 2022-05-03 DIAGNOSIS — R6882 Decreased libido: Secondary | ICD-10-CM

## 2022-05-03 NOTE — Telephone Encounter (Signed)
Pt called asking if Lattie Haw could give him a call back? Pt stated he got labs scheduled for 05/17/22, pt is requesting for some labs to be done on something he hasn't gotten done in any awhile. Pt also asked is there someone he can be referred to for a colonoscopy before the end of the year? Call back # [phone]

## 2022-05-03 NOTE — Telephone Encounter (Signed)
It looks like he's not due for colonoscopy until 02/2023 (as he had it 02/2016 and recommendation was rpt 7 yrs).  Regarding testosterone, is he having any fatigue, decreased sex drive or depressed mood?

## 2022-05-03 NOTE — Telephone Encounter (Signed)
Spoke to patient by telephone and was advised that he would like to have his testosterone level checked. Patient stated that he does not think that he has ever had it checked. Patient stated that he does not have any particular reason to have it checked.  Patient stated that he has had a colonoscopy before and has been going to [facility]. Patient stated that his doctor retired and he can not get in with a PA at [facility] to start the process for a colonoscopy until 08/03/22. Patient stated that he had met his deductible and out of pocket expenses for the year and was hoping to get it done before the end of the year. Patient stated that he was wondering if Dr. Danise Mina can get him in with a [facility] doctor sooner.

## 2022-05-04 DIAGNOSIS — R6882 Decreased libido: Secondary | ICD-10-CM | POA: Insufficient documentation

## 2022-05-04 NOTE — Addendum Note (Signed)
Addended by: Ria Bush on: 05/04/2022 01:37 PM   Modules accepted: Orders

## 2022-05-04 NOTE — Telephone Encounter (Signed)
Patient notified as instructed by telephone and verbalized understanding. Patient stated that he is having a decrease in sex drive.Eric [name]

## 2022-05-04 NOTE — Telephone Encounter (Signed)
Patient notified that T level has been added to his lab orders.

## 2022-05-04 NOTE — Telephone Encounter (Signed)
Will check T levels at upcoming CPE labs

## 2022-05-15 ENCOUNTER — Other Ambulatory Visit: Payer: Self-pay | Admitting: Family Medicine

## 2022-05-15 DIAGNOSIS — E1169 Type 2 diabetes mellitus with other specified complication: Secondary | ICD-10-CM

## 2022-05-17 ENCOUNTER — Other Ambulatory Visit (INDEPENDENT_AMBULATORY_CARE_PROVIDER_SITE_OTHER): Payer: Medicare Other

## 2022-05-17 DIAGNOSIS — R6882 Decreased libido: Secondary | ICD-10-CM

## 2022-05-17 DIAGNOSIS — E1169 Type 2 diabetes mellitus with other specified complication: Secondary | ICD-10-CM

## 2022-05-17 LAB — BASIC METABOLIC PANEL
BUN: 11 mg/dL (ref 6–23)
CO2: 27 mEq/L (ref 19–32)
Calcium: 9.7 mg/dL (ref 8.4–10.5)
Chloride: 98 mEq/L (ref 96–112)
Creatinine, Ser: 0.75 mg/dL (ref 0.40–1.50)
GFR: 95.05 mL/min (ref >60.00)
Glucose, Bld: 106 mg/dL — ABNORMAL HIGH (ref 70–99)
Potassium: 4 mEq/L (ref 3.5–5.1)
Sodium: 134 mEq/L — ABNORMAL LOW (ref 135–145)

## 2022-05-17 LAB — TESTOSTERONE: Testosterone: 358.46 ng/dL (ref 300.00–890.00)

## 2022-05-17 LAB — HEMOGLOBIN A1C: Hgb A1c MFr Bld: 6.8 % — ABNORMAL HIGH (ref 4.6–6.5)

## 2022-05-20 ENCOUNTER — Other Ambulatory Visit: Payer: Self-pay | Admitting: Family Medicine

## 2022-05-24 ENCOUNTER — Encounter: Payer: Self-pay | Admitting: Family Medicine

## 2022-05-24 ENCOUNTER — Ambulatory Visit (INDEPENDENT_AMBULATORY_CARE_PROVIDER_SITE_OTHER): Payer: Medicare Other | Admitting: Family Medicine

## 2022-05-24 VITALS — BP 134/70 | HR 78 | Temp 97.3°F | Ht 68.0 in | Wt 247.5 lb

## 2022-05-24 DIAGNOSIS — G8929 Other chronic pain: Secondary | ICD-10-CM | POA: Diagnosis not present

## 2022-05-24 DIAGNOSIS — E1169 Type 2 diabetes mellitus with other specified complication: Secondary | ICD-10-CM

## 2022-05-24 DIAGNOSIS — M25511 Pain in right shoulder: Secondary | ICD-10-CM | POA: Diagnosis not present

## 2022-05-24 DIAGNOSIS — M25512 Pain in left shoulder: Secondary | ICD-10-CM

## 2022-05-24 MED ORDER — OZEMPIC (0.25 OR 0.5 MG/DOSE) 2 MG/3ML ~~LOC~~ SOPN: 0.5000 mg | PEN_INJECTOR | SUBCUTANEOUS | 3 refills | Status: DC

## 2022-05-24 MED ORDER — SILDENAFIL CITRATE 100 MG PO TABS: 50.0000 mg | ORAL_TABLET | Freq: Every day | ORAL | 3 refills | Status: DC | PRN

## 2022-05-24 NOTE — Assessment & Plan Note (Signed)
Now s/p bilateral shoulder repairs, latest on left, still wearing sling on left.

## 2022-05-24 NOTE — Assessment & Plan Note (Addendum)
Chronic, stable on current regimen of metformin and ozempic - continue. Weight stable despite recent sedentary period after shoulder surgery. Tolerating GLP1RA.  Discussed patting dry and lotrimin antifungal for maceration between toes.  Foot exam today.

## 2022-05-24 NOTE — Patient Instructions (Addendum)
Continue current medicines.  May use lotrimin antifungal between toes.  Good to see you today Return next year any time for welcome to medicare visit.  (Maybe in 4 months)

## 2022-05-24 NOTE — Progress Notes (Signed)
Patient ID: Eric [name], male    DOB: 01/07/1956, 67 y.o.   MRN: [id number]  This visit was conducted in person.  BP 134/70   Pulse 78   Temp (!) 97.3 F (36.3 C) (Temporal)   Ht '5\' 8"'$  (1.727 m)   Wt 247 lb 8 oz (112.3 kg)   SpO2 94%   BMI 37.63 kg/m    CC: 6 mo f/u visit  Subjective:   HPI: Eric [name] is a 66 y.o. male presenting on 05/24/2022 for Annual Exam   5 wks s/p left shoulder surgery. Just started celebrex '200mg'$  daily. Continues physical therapy. S/p R RTC repair earlier in the year.   Requests viagra refilled for ED (obtaining). Cialis ineffective.   DM - does not regularly check sugars. Compliant with antihyperglycemic regimen which includes: metformin '1500mg'$  with breakfast and ozempic 0.'5mg'$  weekly. Denies low sugars or hypoglycemic symptoms. Denies paresthesias, blurry vision. Last diabetic eye exam 10/2021. Glucometer brand: doesn't have. Last foot exam: 10/2020 - rpt today. DSME: declined. Lab Results  Component Value Date   HGBA1C 6.8 (H) 05/17/2022   Diabetic Foot Exam - Simple   Simple Foot Form Diabetic Foot exam was performed with the following findings: Yes 05/24/2022  1:14 PM  Visual Inspection See comments: Yes Sensation Testing Intact to touch and monofilament testing bilaterally: Yes Pulse Check Posterior Tibialis and Dorsalis pulse intact bilaterally: Yes Comments Maceration between toes bilateral feet at 3 lateral toes     Lab Results  Component Value Date   MICROALBUR 0.8 11/16/2021         Relevant past medical, surgical, family and social history reviewed and updated as indicated. Interim medical history since our last visit reviewed. Allergies and medications reviewed and updated. Outpatient Medications Prior to Visit  Medication Sig Dispense Refill   acetaminophen (TYLENOL) 500 MG tablet Take 2 tablets (1,000 mg total) by mouth every 8 (eight) hours. 90 tablet 2   Ascorbic Acid (VITAMIN C) 1000 MG tablet Take 1,000  mg by mouth every evening.      celecoxib (CELEBREX) 200 MG capsule Take 1 capsule (200 mg total) by mouth 2 (two) times daily.     cholecalciferol (VITAMIN D3) 25 MCG (1000 UT) tablet Take 1,000 Units by mouth every evening.      ciprofloxacin (CIPRO) 500 MG tablet Take 1 tablet (500 mg total) by mouth 2 (two) times daily as needed (Diverticulitis). 20 tablet 1   clopidogrel (PLAVIX) 75 MG tablet Take 1 tablet (75 mg total) by mouth daily. 90 tablet 3   Coenzyme Q10 200 MG capsule Take 200 mg by mouth daily in the afternoon.      cyclobenzaprine (FLEXERIL) 10 MG tablet Take 1 tablet (10 mg total) by mouth 3 (three) times daily as needed for muscle spasms. 30 tablet 1   diazepam (VALIUM) 5 MG tablet Take 1 tablet (5 mg total) by mouth every 12 (twelve) hours as needed for anxiety or sedation (sleep). 30 tablet 0   dicyclomine (BENTYL) 20 MG tablet Take 20 mg by mouth every 6 (six) hours as needed for spasms.     diphenhydrAMINE (BENADRYL) 25 MG tablet Take 25 mg by mouth every 6 (six) hours as needed for allergies.      docusate sodium (COLACE) 100 MG capsule Take 100 mg by mouth daily.     EPINEPHrine 0.3 mg/0.3 mL IJ SOAJ injection INJECT IN THE MUSCLE AS DIRECTED 4 each 0   famotidine (PEPCID) 20 MG  tablet Take 1 tablet (20 mg total) by mouth 2 (two) times daily. 180 tablet 3   fexofenadine (ALLEGRA) 180 MG tablet Take 1 tablet (180 mg total) by mouth daily. 90 tablet 3   hydrochlorothiazide (HYDRODIURIL) 25 MG tablet TAKE 1 TABLET BY MOUTH DAILY 90 tablet 1   hydrocortisone 2.5 % cream Apply topically 2 (two) times daily as needed.     hydrocortisone 2.5 % cream Apply to affected areas at eyebrows and forehead daily on T, Thur, Saturday. 30 g 11   ipratropium (ATROVENT) 0.06 % nasal spray Place 2 sprays into both nostrils 4 (four) times daily. 15 mL 12   ketoconazole (NIZORAL) 2 % cream Apply topically to aa's of eyebrows and forehead once daily M-W-F 30 g 11   losartan (COZAAR) 100 MG tablet  TAKE 1 TABLET(100 MG) BY MOUTH DAILY 90 tablet 1   metFORMIN (GLUCOPHAGE) 500 MG tablet Take 3 tablets (1,500 mg total) by mouth daily with breakfast. (Patient taking differently: Take 1,000 mg by mouth daily with breakfast.) 270 tablet 3   Methylcellulose, Laxative, (CITRUCEL) 500 MG TABS Take 1,000 mg by mouth at bedtime.     Multiple Vitamin (MULTIVITAMIN WITH MINERALS) TABS tablet Take 1 tablet by mouth every evening.     nitroGLYCERIN (NITROSTAT) 0.4 MG SL tablet Place 1 tablet (0.4 mg total) under the tongue every 5 (five) minutes x 3 doses as needed for chest pain. PLACE 1 TABLET UNDER THE TONGUE IF NEEDED EVERY 5 MINUTES FOR CHEST PAIN FOR 3 DOSES IF NO RELIEF AFER FIRST DOSE CALL PRESCRIBER OR 911 25 tablet 3   Omega-3 Fatty Acids (FISH OIL CONCENTRATE) 1000 MG CAPS Take 3,000 mg by mouth every evening.      omeprazole (PRILOSEC) 40 MG capsule Take 1 capsule (40 mg total) by mouth daily. 90 capsule 3   ondansetron (ZOFRAN-ODT) 4 MG disintegrating tablet Take 1 tablet (4 mg total) by mouth every 8 (eight) hours as needed for nausea or vomiting. 20 tablet 0   ondansetron (ZOFRAN-ODT) 4 MG disintegrating tablet Take 1 tablet (4 mg total) by mouth every 8 (eight) hours as needed for nausea or vomiting. 20 tablet 0   oxyCODONE (ROXICODONE) 5 MG immediate release tablet Take 1-2 tablets (5-10 mg total) by mouth every 4 (four) hours as needed (pain). 30 tablet 0   promethazine-dextromethorphan (PROMETHAZINE-DM) 6.25-15 MG/5ML syrup Take 5 mLs by mouth 4 (four) times daily as needed. 118 mL 0   propranolol (INDERAL) 20 MG tablet Take 1 tablet (20 mg total) by mouth 3 (three) times daily as needed (tachycardia). 30 tablet 3   rosuvastatin (CRESTOR) 40 MG tablet Take 1 tablet (40 mg total) by mouth daily. 90 tablet 3   traZODone (DESYREL) 50 MG tablet Take 0.5-1 tablets (25-50 mg total) by mouth at bedtime as needed for sleep. 90 tablet 3   TURMERIC PO Take 1,000 mg by mouth in the morning and at  bedtime.     OZEMPIC, 0.25 OR 0.5 MG/DOSE, 2 MG/3ML SOPN Inject into the skin.     sildenafil (VIAGRA) 100 MG tablet Take 0.5-1 tablets (50-100 mg total) by mouth daily as needed for erectile dysfunction. 20 tablet 3   benzonatate (TESSALON) 100 MG capsule Take 2 capsules (200 mg total) by mouth every 8 (eight) hours. 21 capsule 0   metroNIDAZOLE (FLAGYL) 500 MG tablet Take 1 tablet (500 mg total) by mouth 3 (three) times daily as needed (Diverticulitis). 20 tablet 1   tadalafil (CIALIS) 20 MG  tablet Take 1 tablet (20 mg total) by mouth daily as needed for erectile dysfunction. (Patient not taking: Reported on 04/22/2022) 30 tablet 1   No facility-administered medications prior to visit.     Per HPI unless specifically indicated in ROS section below Review of Systems  Objective:  BP 134/70   Pulse 78   Temp (!) 97.3 F (36.3 C) (Temporal)   Ht '5\' 8"'$  (1.727 m)   Wt 247 lb 8 oz (112.3 kg)   SpO2 94%   BMI 37.63 kg/m   Wt Readings from Last 3 Encounters:  05/24/22 247 lb 8 oz (112.3 kg)  04/22/22 245 lb (111.1 kg)  04/01/22 247 lb 2 oz (112.1 kg)      Physical Exam Vitals and nursing note reviewed.  Constitutional:      Appearance: Normal appearance. He is not ill-appearing.  Eyes:     Extraocular Movements: Extraocular movements intact.     Conjunctiva/sclera: Conjunctivae normal.     Pupils: Pupils are equal, round, and reactive to light.  Cardiovascular:     Rate and Rhythm: Normal rate and regular rhythm.     Pulses: Normal pulses.     Heart sounds: Normal heart sounds. No murmur heard. Pulmonary:     Effort: Pulmonary effort is normal. No respiratory distress.     Breath sounds: Normal breath sounds. No wheezing, rhonchi or rales.  Musculoskeletal:     Right lower leg: No edema.     Left lower leg: No edema.     Comments: See HPI for foot exam if done  Skin:    General: Skin is warm and dry.     Findings: No rash.  Neurological:     Mental Status: He is alert.   Psychiatric:        Mood and Affect: Mood normal.        Behavior: Behavior normal.       Results for orders placed or performed in visit on 33/29/51  Basic metabolic panel  Result Value Ref Range   Sodium 134 (L) 135 - 145 mEq/L   Potassium 4.0 3.5 - 5.1 mEq/L   Chloride 98 96 - 112 mEq/L   CO2 27 19 - 32 mEq/L   Glucose, Bld 106 (H) 70 - 99 mg/dL   BUN 11 6 - 23 mg/dL   Creatinine, Ser 0.75 0.40 - 1.50 mg/dL   GFR 95.05 >60.00 mL/min   Calcium 9.7 8.4 - 10.5 mg/dL  Hemoglobin A1c  Result Value Ref Range   Hgb A1c MFr Bld 6.8 (H) 4.6 - 6.5 %  Testosterone  Result Value Ref Range   Testosterone 358.46 300.00 - 890.00 ng/dL    Assessment & Plan:   Problem List Items Addressed This Visit     Severe obesity (BMI 35.0-39.9) with comorbidity ([place])    Continue to encourage healthy diet and lifestyle for sustainable weight loss. Continue ozempic.  Obesity complicated by HTN, HLD, DM, CAD, arthritis and fatty liver.       Relevant Medications   OZEMPIC, 0.25 OR 0.5 MG/DOSE, 2 MG/3ML SOPN   Type 2 diabetes mellitus with other specified complication (HCC) - Primary    Chronic, stable on current regimen of metformin and ozempic - continue. Weight stable despite recent sedentary period after shoulder surgery. Tolerating GLP1RA.  Discussed patting dry and lotrimin antifungal for maceration between toes.  Foot exam today.       Relevant Medications   OZEMPIC, 0.25 OR 0.5 MG/DOSE, 2 MG/3ML SOPN  Chronic pain of both shoulders    Now s/p bilateral shoulder repairs, latest on left, still wearing sling on left.       Relevant Medications   celecoxib (CELEBREX) 200 MG capsule     Meds ordered this encounter  Medications   OZEMPIC, 0.25 OR 0.5 MG/DOSE, 2 MG/3ML SOPN    Sig: Inject 0.5 mg into the skin once a week.    Dispense:  9 mL    Refill:  3   sildenafil (VIAGRA) 100 MG tablet    Sig: Take 0.5-1 tablets (50-100 mg total) by mouth daily as needed for erectile  dysfunction.    Dispense:  20 tablet    Refill:  3   No orders of the defined types were placed in this encounter.    Patient Instructions  Continue current medicines.  May use lotrimin antifungal between toes.  Good to see you today Return next year any time for welcome to medicare visit.  (Maybe in 4 months)  Follow up plan: Return in about 4 months (around 09/22/2022).  Ria Bush, MD

## 2022-05-24 NOTE — Assessment & Plan Note (Signed)
Continue to encourage healthy diet and lifestyle for sustainable weight loss. Continue ozempic.  Obesity complicated by HTN, HLD, DM, CAD, arthritis and fatty liver.

## 2022-06-28 ENCOUNTER — Other Ambulatory Visit: Payer: Self-pay | Admitting: Family Medicine

## 2022-06-28 DIAGNOSIS — E1169 Type 2 diabetes mellitus with other specified complication: Secondary | ICD-10-CM

## 2022-07-29 DIAGNOSIS — M6281 Muscle weakness (generalized): Secondary | ICD-10-CM | POA: Diagnosis not present

## 2022-07-29 DIAGNOSIS — M25512 Pain in left shoulder: Secondary | ICD-10-CM | POA: Diagnosis not present

## 2022-07-29 DIAGNOSIS — M25612 Stiffness of left shoulder, not elsewhere classified: Secondary | ICD-10-CM | POA: Diagnosis not present

## 2022-07-29 DIAGNOSIS — Z9889 Other specified postprocedural states: Secondary | ICD-10-CM | POA: Diagnosis not present

## 2022-08-02 DIAGNOSIS — M25512 Pain in left shoulder: Secondary | ICD-10-CM | POA: Diagnosis not present

## 2022-08-03 ENCOUNTER — Telehealth: Payer: Self-pay | Admitting: Cardiovascular Disease

## 2022-08-03 DIAGNOSIS — Z7902 Long term (current) use of antithrombotics/antiplatelets: Secondary | ICD-10-CM | POA: Diagnosis not present

## 2022-08-03 DIAGNOSIS — K5900 Constipation, unspecified: Secondary | ICD-10-CM | POA: Diagnosis not present

## 2022-08-03 DIAGNOSIS — Z86010 Personal history of colonic polyps: Secondary | ICD-10-CM | POA: Diagnosis not present

## 2022-08-03 NOTE — Telephone Encounter (Signed)
   Pre-operative Risk Assessment    Patient Name: Eric [name]  DOB: September 16, 1955 MRN: [id number]      Request for Surgical Clearance    Procedure:   Colonoscopy at [facility]  Date of Surgery:  Clearance 11/09/22                                 Surgeon:  not indicated Surgeon's Group or Practice Name:  [facility] Phone number:  [phone] Fax number:  [phone]   Type of Clearance Requested:   - Pharmacy:  Hold Clopidogrel (Plavix) instructions   Type of Anesthesia:  Not Indicated   Additional requests/questions:    Manfred Arch   08/03/2022, 3:57 PM

## 2022-08-03 NOTE — Telephone Encounter (Signed)
   Patient Name: Eric [name]  DOB: [birth year] MRN: [id number]  Primary Cardiologist: Ida Rogue, MD  Chart reviewed as part of pre-operative protocol coverage. Pre-op clearance already addressed by colleagues in earlier phone notes. To summarize recommendations:  Would hold Plavix 5 days prior to shoulder surgery and restart when cleared by GI doctor. Would remain on low-dose aspirin 81 mg daily  perioperatively. (Previously cleared for this 3/23 for shoulder surgery) Last seen in the clinic 9/23.  Medical clearance not requested at this time.  Will route this bundled recommendation to requesting provider via Epic fax function and remove from pre-op pool. Please call with questions.  Elgie Collard, PA-C 08/03/2022, 4:43 PM

## 2022-08-04 DIAGNOSIS — Z9889 Other specified postprocedural states: Secondary | ICD-10-CM | POA: Diagnosis not present

## 2022-08-04 DIAGNOSIS — M75112 Incomplete rotator cuff tear or rupture of left shoulder, not specified as traumatic: Secondary | ICD-10-CM | POA: Diagnosis not present

## 2022-08-04 DIAGNOSIS — M25512 Pain in left shoulder: Secondary | ICD-10-CM | POA: Diagnosis not present

## 2022-08-04 DIAGNOSIS — M6281 Muscle weakness (generalized): Secondary | ICD-10-CM | POA: Diagnosis not present

## 2022-08-04 DIAGNOSIS — M25612 Stiffness of left shoulder, not elsewhere classified: Secondary | ICD-10-CM | POA: Diagnosis not present

## 2022-08-08 DIAGNOSIS — Z9889 Other specified postprocedural states: Secondary | ICD-10-CM | POA: Diagnosis not present

## 2022-08-08 DIAGNOSIS — M25612 Stiffness of left shoulder, not elsewhere classified: Secondary | ICD-10-CM | POA: Diagnosis not present

## 2022-08-08 DIAGNOSIS — M6281 Muscle weakness (generalized): Secondary | ICD-10-CM | POA: Diagnosis not present

## 2022-08-08 DIAGNOSIS — M25512 Pain in left shoulder: Secondary | ICD-10-CM | POA: Diagnosis not present

## 2022-08-10 DIAGNOSIS — M25512 Pain in left shoulder: Secondary | ICD-10-CM | POA: Diagnosis not present

## 2022-08-10 DIAGNOSIS — M6281 Muscle weakness (generalized): Secondary | ICD-10-CM | POA: Diagnosis not present

## 2022-08-10 DIAGNOSIS — Z9889 Other specified postprocedural states: Secondary | ICD-10-CM | POA: Diagnosis not present

## 2022-08-10 DIAGNOSIS — M25612 Stiffness of left shoulder, not elsewhere classified: Secondary | ICD-10-CM | POA: Diagnosis not present

## 2022-08-16 DIAGNOSIS — M25612 Stiffness of left shoulder, not elsewhere classified: Secondary | ICD-10-CM | POA: Diagnosis not present

## 2022-08-16 DIAGNOSIS — M6281 Muscle weakness (generalized): Secondary | ICD-10-CM | POA: Diagnosis not present

## 2022-08-16 DIAGNOSIS — Z9889 Other specified postprocedural states: Secondary | ICD-10-CM | POA: Diagnosis not present

## 2022-08-16 DIAGNOSIS — M25512 Pain in left shoulder: Secondary | ICD-10-CM | POA: Diagnosis not present

## 2022-08-18 DIAGNOSIS — Z9889 Other specified postprocedural states: Secondary | ICD-10-CM | POA: Diagnosis not present

## 2022-08-18 DIAGNOSIS — M25512 Pain in left shoulder: Secondary | ICD-10-CM | POA: Diagnosis not present

## 2022-08-18 DIAGNOSIS — M25612 Stiffness of left shoulder, not elsewhere classified: Secondary | ICD-10-CM | POA: Diagnosis not present

## 2022-08-18 DIAGNOSIS — M6281 Muscle weakness (generalized): Secondary | ICD-10-CM | POA: Diagnosis not present

## 2022-08-23 DIAGNOSIS — M25512 Pain in left shoulder: Secondary | ICD-10-CM | POA: Diagnosis not present

## 2022-08-23 DIAGNOSIS — M6281 Muscle weakness (generalized): Secondary | ICD-10-CM | POA: Diagnosis not present

## 2022-08-23 DIAGNOSIS — M25612 Stiffness of left shoulder, not elsewhere classified: Secondary | ICD-10-CM | POA: Diagnosis not present

## 2022-08-23 DIAGNOSIS — Z9889 Other specified postprocedural states: Secondary | ICD-10-CM | POA: Diagnosis not present

## 2022-08-25 DIAGNOSIS — M25512 Pain in left shoulder: Secondary | ICD-10-CM | POA: Diagnosis not present

## 2022-08-25 DIAGNOSIS — M25612 Stiffness of left shoulder, not elsewhere classified: Secondary | ICD-10-CM | POA: Diagnosis not present

## 2022-08-25 DIAGNOSIS — Z9889 Other specified postprocedural states: Secondary | ICD-10-CM | POA: Diagnosis not present

## 2022-08-25 DIAGNOSIS — M6281 Muscle weakness (generalized): Secondary | ICD-10-CM | POA: Diagnosis not present

## 2022-08-30 DIAGNOSIS — M6281 Muscle weakness (generalized): Secondary | ICD-10-CM | POA: Diagnosis not present

## 2022-08-30 DIAGNOSIS — M25512 Pain in left shoulder: Secondary | ICD-10-CM | POA: Diagnosis not present

## 2022-08-30 DIAGNOSIS — Z9889 Other specified postprocedural states: Secondary | ICD-10-CM | POA: Diagnosis not present

## 2022-08-30 DIAGNOSIS — M25612 Stiffness of left shoulder, not elsewhere classified: Secondary | ICD-10-CM | POA: Diagnosis not present

## 2022-09-01 DIAGNOSIS — M25612 Stiffness of left shoulder, not elsewhere classified: Secondary | ICD-10-CM | POA: Diagnosis not present

## 2022-09-01 DIAGNOSIS — M25512 Pain in left shoulder: Secondary | ICD-10-CM | POA: Diagnosis not present

## 2022-09-01 DIAGNOSIS — Z9889 Other specified postprocedural states: Secondary | ICD-10-CM | POA: Diagnosis not present

## 2022-09-01 DIAGNOSIS — M6281 Muscle weakness (generalized): Secondary | ICD-10-CM | POA: Diagnosis not present

## 2022-09-06 DIAGNOSIS — Z9889 Other specified postprocedural states: Secondary | ICD-10-CM | POA: Diagnosis not present

## 2022-09-06 DIAGNOSIS — M25512 Pain in left shoulder: Secondary | ICD-10-CM | POA: Diagnosis not present

## 2022-09-06 DIAGNOSIS — M25612 Stiffness of left shoulder, not elsewhere classified: Secondary | ICD-10-CM | POA: Diagnosis not present

## 2022-09-06 DIAGNOSIS — M6281 Muscle weakness (generalized): Secondary | ICD-10-CM | POA: Diagnosis not present

## 2022-09-08 DIAGNOSIS — M25512 Pain in left shoulder: Secondary | ICD-10-CM | POA: Diagnosis not present

## 2022-09-08 DIAGNOSIS — M6281 Muscle weakness (generalized): Secondary | ICD-10-CM | POA: Diagnosis not present

## 2022-09-08 DIAGNOSIS — Z9889 Other specified postprocedural states: Secondary | ICD-10-CM | POA: Diagnosis not present

## 2022-09-08 DIAGNOSIS — M25612 Stiffness of left shoulder, not elsewhere classified: Secondary | ICD-10-CM | POA: Diagnosis not present

## 2022-09-13 DIAGNOSIS — M6281 Muscle weakness (generalized): Secondary | ICD-10-CM | POA: Diagnosis not present

## 2022-09-13 DIAGNOSIS — M25512 Pain in left shoulder: Secondary | ICD-10-CM | POA: Diagnosis not present

## 2022-09-13 DIAGNOSIS — M25612 Stiffness of left shoulder, not elsewhere classified: Secondary | ICD-10-CM | POA: Diagnosis not present

## 2022-09-13 DIAGNOSIS — Z9889 Other specified postprocedural states: Secondary | ICD-10-CM | POA: Diagnosis not present

## 2022-09-15 ENCOUNTER — Other Ambulatory Visit: Payer: Self-pay | Admitting: Cardiovascular Disease

## 2022-09-15 ENCOUNTER — Other Ambulatory Visit: Payer: Self-pay | Admitting: Family Medicine

## 2022-09-15 DIAGNOSIS — M25512 Pain in left shoulder: Secondary | ICD-10-CM | POA: Diagnosis not present

## 2022-09-15 DIAGNOSIS — Z9889 Other specified postprocedural states: Secondary | ICD-10-CM | POA: Diagnosis not present

## 2022-09-15 DIAGNOSIS — M25612 Stiffness of left shoulder, not elsewhere classified: Secondary | ICD-10-CM | POA: Diagnosis not present

## 2022-09-15 DIAGNOSIS — E1169 Type 2 diabetes mellitus with other specified complication: Secondary | ICD-10-CM

## 2022-09-15 DIAGNOSIS — M6281 Muscle weakness (generalized): Secondary | ICD-10-CM | POA: Diagnosis not present

## 2022-09-15 NOTE — Telephone Encounter (Signed)
Please advise if ok to refill non cardiac medications.

## 2022-09-27 ENCOUNTER — Ambulatory Visit: Payer: BC Managed Care – PPO | Admitting: Family Medicine

## 2022-09-30 ENCOUNTER — Telehealth: Payer: Self-pay | Admitting: Cardiovascular Disease

## 2022-09-30 DIAGNOSIS — L219 Seborrheic dermatitis, unspecified: Secondary | ICD-10-CM

## 2022-09-30 MED ORDER — LOSARTAN POTASSIUM 100 MG PO TABS: ORAL_TABLET | ORAL | 1 refills | Status: DC

## 2022-09-30 NOTE — Telephone Encounter (Signed)
*  STAT* If patient is at the pharmacy, call can be transferred to refill team.   1. Which medications need to be refilled? (please list name of each medication and dose if known) losartan (COZAAR) 100 MG tablet   2. Which pharmacy/location (including street and city if local pharmacy) is medication to be sent to? [facility] [id number] - [name], [place]   3. Do they need a 30 day or 90 day supply? 90 day

## 2022-09-30 NOTE — Telephone Encounter (Signed)
Pt's medication was sent to pt's pharmacy as requested. Confirmation received.  °
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
## 2022-10-04 ENCOUNTER — Encounter: Payer: Self-pay | Admitting: Cardiovascular Disease

## 2022-10-04 ENCOUNTER — Ambulatory Visit (INDEPENDENT_AMBULATORY_CARE_PROVIDER_SITE_OTHER): Payer: Medicare HMO | Admitting: Family Medicine

## 2022-10-04 ENCOUNTER — Encounter: Payer: Self-pay | Admitting: Family Medicine

## 2022-10-04 ENCOUNTER — Ambulatory Visit: Payer: Medicare HMO | Attending: Cardiovascular Disease | Admitting: Cardiovascular Disease

## 2022-10-04 VITALS — BP 122/60 | HR 85 | Ht 69.0 in | Wt 254.0 lb

## 2022-10-04 VITALS — BP 130/68 | HR 79 | Temp 97.4°F | Ht 68.0 in | Wt 252.2 lb

## 2022-10-04 DIAGNOSIS — T783XXA Angioneurotic edema, initial encounter: Secondary | ICD-10-CM

## 2022-10-04 DIAGNOSIS — I25118 Atherosclerotic heart disease of native coronary artery with other forms of angina pectoris: Secondary | ICD-10-CM

## 2022-10-04 DIAGNOSIS — E782 Mixed hyperlipidemia: Secondary | ICD-10-CM

## 2022-10-04 DIAGNOSIS — K219 Gastro-esophageal reflux disease without esophagitis: Secondary | ICD-10-CM | POA: Diagnosis not present

## 2022-10-04 DIAGNOSIS — E1169 Type 2 diabetes mellitus with other specified complication: Secondary | ICD-10-CM

## 2022-10-04 DIAGNOSIS — E118 Type 2 diabetes mellitus with unspecified complications: Secondary | ICD-10-CM | POA: Diagnosis not present

## 2022-10-04 DIAGNOSIS — I10 Essential (primary) hypertension: Secondary | ICD-10-CM

## 2022-10-04 DIAGNOSIS — M25532 Pain in left wrist: Secondary | ICD-10-CM | POA: Insufficient documentation

## 2022-10-04 DIAGNOSIS — I4719 Other supraventricular tachycardia: Secondary | ICD-10-CM

## 2022-10-04 LAB — POCT GLYCOSYLATED HEMOGLOBIN (HGB A1C): Hemoglobin A1C: 7 % — AB (ref 4.0–5.6)

## 2022-10-04 MED ORDER — PANTOPRAZOLE SODIUM 40 MG PO TBEC: 40.0000 mg | DELAYED_RELEASE_TABLET | Freq: Every day | ORAL | 3 refills | Status: DC

## 2022-10-04 MED ORDER — NITROGLYCERIN 0.4 MG SL SUBL: 0.4000 mg | SUBLINGUAL_TABLET | SUBLINGUAL | 3 refills | Status: DC | PRN

## 2022-10-04 NOTE — Patient Instructions (Signed)
Medication Instructions:  No changes  If you need a refill on your cardiac medications before your next appointment, please call your pharmacy.    Lab work: No new labs needed   Testing/Procedures: No new testing needed   Follow-Up: At [facility], you and your health needs are our priority.  As part of our continuing mission to provide you with exceptional heart care, we have created designated Provider Care Teams.  These Care Teams include your primary Cardiologist (physician) and Advanced Practice Providers (APPs -  Physician Assistants and Nurse Practitioners) who all work together to provide you with the care you need, when you need it.  You will need a follow up appointment in 6 months  Providers on your designated Care Team:   Christopher Berge, NP Ryan Dunn, PA-C Cadence Furth, PA-C  COVID-19 Vaccine Information can be found at: [email or web address] For questions related to vaccine distribution or appointments, please email [email or web address] or call [phone].   

## 2022-10-04 NOTE — Assessment & Plan Note (Addendum)
Ongoing since L shoulder surgery, initially treated by ortho with celebrex x 1 mo with benefit, but will defer further refill in daily plavix use.  Exam overall stable today, ?possible dorsal wrist cyst.  Rec topical voltaren PRN, update with effect.

## 2022-10-04 NOTE — Assessment & Plan Note (Signed)
Saw allergist at [facility], thought allergy to grains and/or soy - managed with pepcid '20mg'$  BID + allegra daily rather than avoidance.

## 2022-10-04 NOTE — Patient Instructions (Addendum)
Change omeprazole to pantoprazole '40mg'$  daily. Pantoprazole sent to [facility].  For left wrist pain/stiffness, may use voltaren (diclofenac) gel over the counter as needed.  Good to see you today Return as needed or in 3-4 months for welcome to medicare visit

## 2022-10-04 NOTE — Assessment & Plan Note (Signed)
Chronic, stable on current regimen. He's been out of ozempic for 3 wks - will restart.

## 2022-10-04 NOTE — Assessment & Plan Note (Addendum)
Longterm on omeprazole - will change to pantoprazole given plavix interaction.  He also continues pepcid BID

## 2022-10-04 NOTE — Progress Notes (Signed)
Patient ID: Eric [name], male    DOB: Aug 17, 1955, 67 y.o.   MRN: [id number]  This visit was conducted in person.  BP 130/68   Pulse 79   Temp (!) 97.4 F (36.3 C) (Temporal)   Ht '5\' 8"'$  (1.727 m)   Wt 252 lb 4 oz (114.4 kg)   SpO2 94%   BMI 38.35 kg/m    CC: 24moDM f/u visit  Subjective:   HPI: Eric [name] a 67y.o. male presenting on 10/04/2022 for Medical Management of Chronic Issues (Here 4 mo f/u. C/o L wrist soreness and hand stiffness- especially in AM. Previously used Celebrex- helpful. )   Recovering from recent L shoulder surgery 03/2022.   GERD - on omeprazole '40mg'$  daily - also on plavix.   Continues allegra and pepcid - without recurrent angioedema - thought due to soy and grains.   L wrist soreness swelling and hand stiffness worse in am - this started after shoulder surgery. Celebrex through ortho has helped this. No redness or warmth.   DM - does not regularly check sugars. Compliant with antihyperglycemic regimen which includes: metformin '1500mg'$  daily, ozempic 0.'5mg'$  weekly (just got ozempic refilled yesterday, was out for 3 weeks. Denies low sugars or hypoglycemic symptoms. Denies paresthesias, blurry vision. Last diabetic eye exam 10/2021. Glucometer brand: doesn't have this. Last foot exam: 04/2022. DSME: declined. Lab Results  Component Value Date   HGBA1C 7.0 (A) 10/04/2022   Diabetic Foot Exam - Simple   No data filed    Lab Results  Component Value Date   MICROALBUR 0.8 11/16/2021         Relevant past medical, surgical, family and social history reviewed and updated as indicated. Interim medical history since our last visit reviewed. Allergies and medications reviewed and updated. Outpatient Medications Prior to Visit  Medication Sig Dispense Refill   acetaminophen (TYLENOL) 500 MG tablet Take 2 tablets (1,000 mg total) by mouth every 8 (eight) hours. 90 tablet 2   Ascorbic Acid (VITAMIN C) 1000 MG tablet Take 1,000 mg by mouth  every evening.      cholecalciferol (VITAMIN D3) 25 MCG (1000 UT) tablet Take 1,000 Units by mouth every evening.      ciprofloxacin (CIPRO) 500 MG tablet Take 1 tablet (500 mg total) by mouth 2 (two) times daily as needed (Diverticulitis). 20 tablet 1   clopidogrel (PLAVIX) 75 MG tablet Take 1 tablet (75 mg total) by mouth daily. 90 tablet 3   Coenzyme Q10 200 MG capsule Take 200 mg by mouth daily in the afternoon.      cyclobenzaprine (FLEXERIL) 10 MG tablet Take 1 tablet (10 mg total) by mouth 3 (three) times daily as needed for muscle spasms. 30 tablet 1   diphenhydrAMINE (BENADRYL) 25 MG tablet Take 25 mg by mouth every 6 (six) hours as needed for allergies.      docusate sodium (COLACE) 100 MG capsule Take 100 mg by mouth daily.     EPINEPHrine 0.3 mg/0.3 mL IJ SOAJ injection INJECT IN THE MUSCLE AS DIRECTED 4 each 0   famotidine (PEPCID) 20 MG tablet Take 1 tablet (20 mg total) by mouth 2 (two) times daily. 180 tablet 3   fexofenadine (ALLEGRA) 180 MG tablet Take 1 tablet (180 mg total) by mouth daily. 90 tablet 3   hydrochlorothiazide (HYDRODIURIL) 25 MG tablet TAKE 1 TABLET BY MOUTH DAILY 90 tablet 0   hydrocortisone 2.5 % cream Apply to affected areas at eyebrows  and forehead daily on T, Thur, Saturday. 30 g 11   ipratropium (ATROVENT) 0.06 % nasal spray Place 2 sprays into both nostrils 4 (four) times daily. 15 mL 12   ketoconazole (NIZORAL) 2 % cream Apply topically to aa's of eyebrows and forehead once daily M-W-F 30 g 11   losartan (COZAAR) 100 MG tablet TAKE 1 TABLET(100 MG) BY MOUTH DAILY 90 tablet 1   metFORMIN (GLUCOPHAGE) 500 MG tablet TAKE 3 TABLETS(1500 MG) BY MOUTH DAILY WITH BREAKFAST 270 tablet 0   Methylcellulose, Laxative, (CITRUCEL) 500 MG TABS Take 1,000 mg by mouth at bedtime.     Multiple Vitamin (MULTIVITAMIN WITH MINERALS) TABS tablet Take 1 tablet by mouth every evening.     nitroGLYCERIN (NITROSTAT) 0.4 MG SL tablet Place 1 tablet (0.4 mg total) under the tongue  every 5 (five) minutes x 3 doses as needed for chest pain. PLACE 1 TABLET UNDER THE TONGUE IF NEEDED EVERY 5 MINUTES FOR CHEST PAIN FOR 3 DOSES IF NO RELIEF AFER FIRST DOSE CALL PRESCRIBER OR 911 25 tablet 3   Omega-3 Fatty Acids (FISH OIL CONCENTRATE) 1000 MG CAPS Take 3,000 mg by mouth every evening.      ondansetron (ZOFRAN-ODT) 4 MG disintegrating tablet Take 1 tablet (4 mg total) by mouth every 8 (eight) hours as needed for nausea or vomiting. 20 tablet 0   ondansetron (ZOFRAN-ODT) 4 MG disintegrating tablet Take 1 tablet (4 mg total) by mouth every 8 (eight) hours as needed for nausea or vomiting. 20 tablet 0   oxyCODONE (ROXICODONE) 5 MG immediate release tablet Take 1-2 tablets (5-10 mg total) by mouth every 4 (four) hours as needed (pain). 30 tablet 0   OZEMPIC, 0.25 OR 0.5 MG/DOSE, 2 MG/3ML SOPN Inject 0.5 mg into the skin once a week. 9 mL 3   promethazine-dextromethorphan (PROMETHAZINE-DM) 6.25-15 MG/5ML syrup Take 5 mLs by mouth 4 (four) times daily as needed. 118 mL 0   propranolol (INDERAL) 20 MG tablet Take 1 tablet (20 mg total) by mouth 3 (three) times daily as needed (tachycardia). 30 tablet 3   rosuvastatin (CRESTOR) 40 MG tablet Take 1 tablet (40 mg total) by mouth daily. 90 tablet 3   sildenafil (VIAGRA) 100 MG tablet Take 0.5-1 tablets (50-100 mg total) by mouth daily as needed for erectile dysfunction. 20 tablet 3   traZODone (DESYREL) 50 MG tablet Take 0.5-1 tablets (25-50 mg total) by mouth at bedtime as needed for sleep. 90 tablet 3   TURMERIC PO Take 1,000 mg by mouth in the morning and at bedtime.     omeprazole (PRILOSEC) 40 MG capsule Take 1 capsule (40 mg total) by mouth daily. 90 capsule 3   hydrocortisone 2.5 % cream Apply topically 2 (two) times daily as needed.     celecoxib (CELEBREX) 200 MG capsule Take 1 capsule (200 mg total) by mouth 2 (two) times daily.     diazepam (VALIUM) 5 MG tablet Take 1 tablet (5 mg total) by mouth every 12 (twelve) hours as needed for  anxiety or sedation (sleep). 30 tablet 0   dicyclomine (BENTYL) 20 MG tablet Take 20 mg by mouth every 6 (six) hours as needed for spasms.     No facility-administered medications prior to visit.     Per HPI unless specifically indicated in ROS section below Review of Systems  Objective:  BP 130/68   Pulse 79   Temp (!) 97.4 F (36.3 C) (Temporal)   Ht '5\' 8"'$  (1.727 m)  Wt 252 lb 4 oz (114.4 kg)   SpO2 94%   BMI 38.35 kg/m   Wt Readings from Last 3 Encounters:  10/04/22 252 lb 4 oz (114.4 kg)  05/24/22 247 lb 8 oz (112.3 kg)  04/22/22 245 lb (111.1 kg)      Physical Exam Vitals and nursing note reviewed.  Constitutional:      Appearance: Normal appearance. He is not ill-appearing.  Eyes:     Extraocular Movements: Extraocular movements intact.     Conjunctiva/sclera: Conjunctivae normal.     Pupils: Pupils are equal, round, and reactive to light.  Cardiovascular:     Rate and Rhythm: Normal rate and regular rhythm.     Pulses: Normal pulses.     Heart sounds: Normal heart sounds. No murmur heard. Pulmonary:     Effort: Pulmonary effort is normal. No respiratory distress.     Breath sounds: Normal breath sounds. No wheezing, rhonchi or rales.  Musculoskeletal:        General: Tenderness (mild) present. No swelling. Normal range of motion.     Right lower leg: No edema.     Left lower leg: No edema.     Comments:  See HPI for foot exam if done Mild tenderness to dorsal left wrist worse with extension No pain at 1st CMC, no pain at anatomical snuff box or with finkelstein test on left  Skin:    General: Skin is warm and dry.     Findings: No rash.  Neurological:     Mental Status: He is alert.  Psychiatric:        Mood and Affect: Mood normal.        Behavior: Behavior normal.       Results for orders placed or performed in visit on 10/04/22  POCT glycosylated hemoglobin (Hb A1C)  Result Value Ref Range   Hemoglobin A1C 7.0 (A) 4.0 - 5.6 %   HbA1c POC (<>  result, manual entry)     HbA1c, POC (prediabetic range)     HbA1c, POC (controlled diabetic range)      Assessment & Plan:   Problem List Items Addressed This Visit     Type 2 diabetes mellitus with other specified complication ([address]) - Primary    Chronic, stable on current regimen. He's been out of ozempic for 3 wks - will restart.       Relevant Orders   POCT glycosylated hemoglobin (Hb A1C) (Completed)   GERD (gastroesophageal reflux disease)    Longterm on omeprazole - will change to pantoprazole given plavix interaction.  He also continues pepcid BID      Relevant Medications   pantoprazole (PROTONIX) 40 MG tablet   Allergic angioedema due to ingested food    Saw allergist at [facility], thought allergy to grains and/or soy - managed with pepcid '20mg'$  BID + allegra daily rather than avoidance.       Left wrist pain    Ongoing since L shoulder surgery, initially treated by ortho with celebrex x 1 mo with benefit, but will defer further refill in daily plavix use.  Exam overall stable today, ?possible dorsal wrist cyst.  Rec topical voltaren PRN, update with effect.         Meds ordered this encounter  Medications   pantoprazole (PROTONIX) 40 MG tablet    Sig: Take 1 tablet (40 mg total) by mouth daily.    Dispense:  90 tablet    Refill:  3    To replace  omeprazole    Orders Placed This Encounter  Procedures   POCT glycosylated hemoglobin (Hb A1C)    Patient Instructions  Change omeprazole to pantoprazole '40mg'$  daily. Pantoprazole sent to [facility] [place].  For left wrist pain/stiffness, may use voltaren (diclofenac) gel over the counter as needed.  Good to see you today Return as needed or in 3-4 months for welcome to medicare visit   Follow up plan: Return in about 3 months (around 01/04/2023), or if symptoms worsen or fail to improve, for annual exam, prior fasting for blood work, medicare wellness visit.  Ria Bush, MD

## 2022-11-01 DIAGNOSIS — H40003 Preglaucoma, unspecified, bilateral: Secondary | ICD-10-CM | POA: Diagnosis not present

## 2022-11-03 DIAGNOSIS — M75112 Incomplete rotator cuff tear or rupture of left shoulder, not specified as traumatic: Secondary | ICD-10-CM | POA: Diagnosis not present

## 2022-11-03 DIAGNOSIS — M75111 Incomplete rotator cuff tear or rupture of right shoulder, not specified as traumatic: Secondary | ICD-10-CM | POA: Diagnosis not present

## 2022-11-03 DIAGNOSIS — E1169 Type 2 diabetes mellitus with other specified complication: Secondary | ICD-10-CM | POA: Diagnosis not present

## 2022-11-08 DIAGNOSIS — H40003 Preglaucoma, unspecified, bilateral: Secondary | ICD-10-CM | POA: Diagnosis not present

## 2022-11-08 DIAGNOSIS — H2513 Age-related nuclear cataract, bilateral: Secondary | ICD-10-CM | POA: Diagnosis not present

## 2022-11-08 DIAGNOSIS — E119 Type 2 diabetes mellitus without complications: Secondary | ICD-10-CM | POA: Diagnosis not present

## 2022-11-08 LAB — HM DIABETES EYE EXAM

## 2022-11-15 ENCOUNTER — Encounter: Payer: Self-pay | Admitting: Internal Medicine

## 2022-11-16 ENCOUNTER — Encounter
Admission: RE | Disposition: A | Discharge status: Home / Self Care | Payer: Self-pay | Source: Home / Self Care | Attending: Internal Medicine

## 2022-11-16 ENCOUNTER — Encounter: Payer: Self-pay | Admitting: Internal Medicine

## 2022-11-16 ENCOUNTER — Ambulatory Visit: Payer: Medicare HMO | Admitting: Anesthesiology

## 2022-11-16 ENCOUNTER — Ambulatory Visit
Admission: RE | Admit: 2022-11-16 | Discharge: 2022-11-16 | Disposition: A | Discharge status: Home / Self Care | Payer: Medicare HMO | Attending: Internal Medicine | Admitting: Internal Medicine

## 2022-11-16 DIAGNOSIS — D123 Benign neoplasm of transverse colon: Secondary | ICD-10-CM | POA: Diagnosis not present

## 2022-11-16 DIAGNOSIS — I10 Essential (primary) hypertension: Secondary | ICD-10-CM | POA: Insufficient documentation

## 2022-11-16 DIAGNOSIS — Z1211 Encounter for screening for malignant neoplasm of colon: Secondary | ICD-10-CM | POA: Insufficient documentation

## 2022-11-16 DIAGNOSIS — Z6836 Body mass index (BMI) 36.0-36.9, adult: Secondary | ICD-10-CM | POA: Insufficient documentation

## 2022-11-16 DIAGNOSIS — E669 Obesity, unspecified: Secondary | ICD-10-CM | POA: Diagnosis not present

## 2022-11-16 DIAGNOSIS — K640 First degree hemorrhoids: Secondary | ICD-10-CM | POA: Diagnosis not present

## 2022-11-16 DIAGNOSIS — Z87891 Personal history of nicotine dependence: Secondary | ICD-10-CM | POA: Diagnosis not present

## 2022-11-16 DIAGNOSIS — K219 Gastro-esophageal reflux disease without esophagitis: Secondary | ICD-10-CM | POA: Insufficient documentation

## 2022-11-16 DIAGNOSIS — E785 Hyperlipidemia, unspecified: Secondary | ICD-10-CM | POA: Insufficient documentation

## 2022-11-16 DIAGNOSIS — E119 Type 2 diabetes mellitus without complications: Secondary | ICD-10-CM | POA: Insufficient documentation

## 2022-11-16 DIAGNOSIS — K649 Unspecified hemorrhoids: Secondary | ICD-10-CM | POA: Diagnosis not present

## 2022-11-16 DIAGNOSIS — K5730 Diverticulosis of large intestine without perforation or abscess without bleeding: Secondary | ICD-10-CM | POA: Diagnosis not present

## 2022-11-16 DIAGNOSIS — Z955 Presence of coronary angioplasty implant and graft: Secondary | ICD-10-CM | POA: Insufficient documentation

## 2022-11-16 DIAGNOSIS — I2510 Atherosclerotic heart disease of native coronary artery without angina pectoris: Secondary | ICD-10-CM | POA: Diagnosis not present

## 2022-11-16 DIAGNOSIS — Z7985 Long-term (current) use of injectable non-insulin antidiabetic drugs: Secondary | ICD-10-CM | POA: Diagnosis not present

## 2022-11-16 DIAGNOSIS — Z85828 Personal history of other malignant neoplasm of skin: Secondary | ICD-10-CM | POA: Diagnosis not present

## 2022-11-16 DIAGNOSIS — K635 Polyp of colon: Secondary | ICD-10-CM | POA: Diagnosis not present

## 2022-11-16 DIAGNOSIS — K5909 Other constipation: Secondary | ICD-10-CM | POA: Diagnosis not present

## 2022-11-16 DIAGNOSIS — Z86010 Personal history of colonic polyps: Secondary | ICD-10-CM | POA: Diagnosis not present

## 2022-11-16 DIAGNOSIS — Z7984 Long term (current) use of oral hypoglycemic drugs: Secondary | ICD-10-CM | POA: Insufficient documentation

## 2022-11-16 HISTORY — PX: COLONOSCOPY: SHX5424

## 2022-11-16 HISTORY — PX: COLONOSCOPY: SHX174

## 2022-11-16 LAB — GLUCOSE, CAPILLARY: Glucose-Capillary: 121 mg/dL — ABNORMAL HIGH (ref 70–99)

## 2022-11-16 SURGERY — COLONOSCOPY
Anesthesia: General

## 2022-11-16 MED ORDER — PROPOFOL 500 MG/50ML IV EMUL
INTRAVENOUS | Status: DC | PRN
  Administered 2022-11-16: 150 ug/kg/min via INTRAVENOUS

## 2022-11-16 MED ORDER — SODIUM CHLORIDE 0.9 % IV SOLN: INTRAVENOUS | Status: DC

## 2022-11-16 MED ORDER — PROPOFOL 1000 MG/100ML IV EMUL
INTRAVENOUS | Status: AC
  Filled 2022-11-16: qty 100

## 2022-11-16 NOTE — Anesthesia Preprocedure Evaluation (Signed)
Anesthesia Evaluation  Patient identified by MRN, date of birth, ID band Patient awake    Reviewed: Allergy & Precautions, NPO status   History of Anesthesia Complications Negative for: history of anesthetic complications  Airway Mallampati: II  TM Distance: >3 FB     Dental  (+) Dental Advidsory Given, Poor Dentition   Pulmonary neg pulmonary ROS, Patient abstained from smoking., former smoker   Pulmonary exam normal        Cardiovascular Exercise Tolerance: Good hypertension, (-) angina + CAD and + Cardiac Stents (x 2)  (-) CABG (-) dysrhythmias (-) Valvular Problems/Murmurs Rhythm:Regular Rate:Normal  HLD   Neuro/Psych negative neurological ROS  negative psych ROS   GI/Hepatic Neg liver ROS,GERD  ,,  Endo/Other  diabetes, Type 2  Obesity - BMI > 35  Renal/GU negative Renal ROS     Musculoskeletal  (+) Arthritis ,    Abdominal   Peds  Hematology   Anesthesia Other Findings Past Medical History: No date: Anginal pain (HCC) No date: Arthritis 2008: Coronary artery disease     Comment:  2 stents  No date: Diabetes type 2, controlled (HCC) 2009, 2012, 2015: Diverticulitis     Comment:  recurrent episodes No date: GERD (gastroesophageal reflux disease) 09/2013: Glaucoma suspect     Comment:  Dr. Inez Pilgrim No date: History of chicken pox 06/2015: History of pneumonia No date: Hyperlipidemia No date: Hypertension 02/29/2020: Salmonella gastroenteritis 01/20/2020: Squamous cell carcinoma of skin     Comment:  Superior perianal area. 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   Reproductive/Obstetrics negative OB ROS                              Anesthesia Physical Anesthesia Plan  ASA: 3  Anesthesia Plan: General   Post-op Pain Management: Minimal or no pain anticipated   Induction: Intravenous  PONV Risk Score and Plan: 3 and Propofol infusion,  TIVA and Ondansetron  Airway Management Planned: Nasal Cannula  Additional Equipment: None  Intra-op Plan:   Post-operative Plan:   Informed Consent: I have reviewed the patients History and Physical, chart, labs and discussed the procedure including the risks, benefits and alternatives for the proposed anesthesia with the patient or authorized representative who has indicated his/her understanding and acceptance.     Dental advisory given  Plan Discussed with: CRNA and Surgeon  Anesthesia Plan Comments: (Discussed risks of anesthesia with patient, including possibility of difficulty with spontaneous ventilation under anesthesia necessitating airway intervention, PONV, and rare risks such as cardiac or respiratory or neurological events, and allergic reactions. Discussed the role of CRNA in patient's perioperative care. Patient understands.)         Anesthesia Quick Evaluation

## 2022-11-16 NOTE — H&P (Signed)
Outpatient short stay form Pre-procedure 11/16/2022 11:15 AM Eric [name] Eric [name], M.D.  Primary Physician: Eric [name], M.D.  Reason for visit:  History of colon polyps  Antiplatelet or antithrombotic long-term use  Constipation, unspecified constipation type   History of present illness:  Eric [name] reports doing well from a GI standpoint. He has issues with chronic constipation & generally has a stool every other day or every third day. He takes a fiber and a stool softener. Has never tried MiraLAX. Does have some straining with bowel movements. He denies any abdominal pain. He had a perianal excision with squamous of carcinoma done October 2021. He has had a small amount rectal bleeding with wiping occasionally since surgery but denies frequent rectal bleeding.  He denies any GERD on omeprazole 40 mg once a day. No nausea, vomiting, dysphagia. No epigastric pain. Appetite good. Has been difficult to loose weight due to 2 shoulder surgeries this year.  Non-smoker, average 2 alcoholic drinks per week. Denies frequent NSAIDs.    No current facility-administered medications for this encounter.  Current Outpatient Medications:    diazepam (VALIUM) 5 MG tablet, Take 5 mg by mouth as directed., Disp: , Rfl:    acetaminophen (TYLENOL) 500 MG tablet, Take 2 tablets (1,000 mg total) by mouth every 8 (eight) hours., Disp: 90 tablet, Rfl: 2   Ascorbic Acid (VITAMIN C) 1000 MG tablet, Take 1,000 mg by mouth every evening. , Disp: , Rfl:    cholecalciferol (VITAMIN D3) 25 MCG (1000 UT) tablet, Take 1,000 Units by mouth every evening. , Disp: , Rfl:    ciprofloxacin (CIPRO) 500 MG tablet, Take 1 tablet (500 mg total) by mouth 2 (two) times daily as needed (Diverticulitis). (Patient not taking: Reported on 10/04/2022), Disp: 20 tablet, Rfl: 1   clopidogrel (PLAVIX) 75 MG tablet, Take 1 tablet (75 mg total) by mouth daily., Disp: 90 tablet, Rfl: 3   Coenzyme Q10 200 MG capsule, Take 200 mg by mouth  daily in the afternoon. , Disp: , Rfl:    cyclobenzaprine (FLEXERIL) 10 MG tablet, Take 1 tablet (10 mg total) by mouth 3 (three) times daily as needed for muscle spasms., Disp: 30 tablet, Rfl: 1   diphenhydrAMINE (BENADRYL) 25 MG tablet, Take 25 mg by mouth every 6 (six) hours as needed for allergies. , Disp: , Rfl:    docusate sodium (COLACE) 100 MG capsule, Take 100 mg by mouth daily., Disp: , Rfl:    EPINEPHrine 0.3 mg/0.3 mL IJ SOAJ injection, INJECT IN THE MUSCLE AS DIRECTED, Disp: 4 each, Rfl: 0   famotidine (PEPCID) 20 MG tablet, Take 1 tablet (20 mg total) by mouth 2 (two) times daily., Disp: 180 tablet, Rfl: 3   fexofenadine (ALLEGRA) 180 MG tablet, Take 1 tablet (180 mg total) by mouth daily., Disp: 90 tablet, Rfl: 3   hydrochlorothiazide (HYDRODIURIL) 25 MG tablet, TAKE 1 TABLET BY MOUTH DAILY, Disp: 90 tablet, Rfl: 0   hydrocortisone 2.5 % cream, Apply topically 2 (two) times daily as needed. (Patient not taking: Reported on 10/04/2022), Disp: , Rfl:    hydrocortisone 2.5 % cream, Apply to affected areas at eyebrows and forehead daily on T, Thur, Saturday., Disp: 30 g, Rfl: 11   ipratropium (ATROVENT) 0.06 % nasal spray, Place 2 sprays into both nostrils 4 (four) times daily., Disp: 15 mL, Rfl: 12   ketoconazole (NIZORAL) 2 % cream, Apply topically to aa's of eyebrows and forehead once daily M-W-F, Disp: 30 g, Rfl: 11   losartan (COZAAR)  100 MG tablet, TAKE 1 TABLET(100 MG) BY MOUTH DAILY, Disp: 90 tablet, Rfl: 1   metFORMIN (GLUCOPHAGE) 500 MG tablet, TAKE 3 TABLETS(1500 MG) BY MOUTH DAILY WITH BREAKFAST, Disp: 270 tablet, Rfl: 0   Methylcellulose, Laxative, (CITRUCEL) 500 MG TABS, Take 1,000 mg by mouth at bedtime., Disp: , Rfl:    Multiple Vitamin (MULTIVITAMIN WITH MINERALS) TABS tablet, Take 1 tablet by mouth every evening., Disp: , Rfl:    nitroGLYCERIN (NITROSTAT) 0.4 MG SL tablet, Place 1 tablet (0.4 mg total) under the tongue every 5 (five) minutes x 3 doses as needed for chest  pain. PLACE 1 TABLET UNDER THE TONGUE IF NEEDED EVERY 5 MINUTES FOR CHEST PAIN FOR 3 DOSES IF NO RELIEF AFER FIRST DOSE CALL PRESCRIBER OR 911, Disp: 25 tablet, Rfl: 3   Omega-3 Fatty Acids (FISH OIL CONCENTRATE) 1000 MG CAPS, Take 3,000 mg by mouth every evening. , Disp: , Rfl:    ondansetron (ZOFRAN-ODT) 4 MG disintegrating tablet, Take 1 tablet (4 mg total) by mouth every 8 (eight) hours as needed for nausea or vomiting., Disp: 20 tablet, Rfl: 0   ondansetron (ZOFRAN-ODT) 4 MG disintegrating tablet, Take 1 tablet (4 mg total) by mouth every 8 (eight) hours as needed for nausea or vomiting., Disp: 20 tablet, Rfl: 0   oxyCODONE (ROXICODONE) 5 MG immediate release tablet, Take 1-2 tablets (5-10 mg total) by mouth every 4 (four) hours as needed (pain)., Disp: 30 tablet, Rfl: 0   OZEMPIC, 0.25 OR 0.5 MG/DOSE, 2 MG/3ML SOPN, Inject 0.5 mg into the skin once a week., Disp: 9 mL, Rfl: 3   pantoprazole (PROTONIX) 40 MG tablet, Take 1 tablet (40 mg total) by mouth daily., Disp: 90 tablet, Rfl: 3   promethazine-dextromethorphan (PROMETHAZINE-DM) 6.25-15 MG/5ML syrup, Take 5 mLs by mouth 4 (four) times daily as needed., Disp: 118 mL, Rfl: 0   propranolol (INDERAL) 20 MG tablet, Take 1 tablet (20 mg total) by mouth 3 (three) times daily as needed (tachycardia)., Disp: 30 tablet, Rfl: 3   rosuvastatin (CRESTOR) 40 MG tablet, Take 1 tablet (40 mg total) by mouth daily., Disp: 90 tablet, Rfl: 3   sildenafil (VIAGRA) 100 MG tablet, Take 0.5-1 tablets (50-100 mg total) by mouth daily as needed for erectile dysfunction., Disp: 20 tablet, Rfl: 3   traZODone (DESYREL) 50 MG tablet, Take 0.5-1 tablets (25-50 mg total) by mouth at bedtime as needed for sleep., Disp: 90 tablet, Rfl: 3   TURMERIC PO, Take 1,000 mg by mouth in the morning and at bedtime., Disp: , Rfl:   No medications prior to admission.     Allergies  Allergen Reactions   Other Swelling    All grains - angioedema - saw allergist.  Managed with daily  antihistamine rather than avoidance   Soy Allergy Swelling    Angioedema - saw allergist  Managed with daily antihistamine rather than avoidance     Past Medical History:  Diagnosis Date   Anginal pain    Arthritis    Coronary artery disease 2008   2 stents    Diabetes type 2, controlled    Diverticulitis 2009, 2012, 2015   recurrent episodes   GERD (gastroesophageal reflux disease)    Glaucoma suspect 09/2013   Dr. Inez Pilgrim   History of chicken pox    History of pneumonia 06/2015   Hyperlipidemia    Hypertension    Salmonella gastroenteritis 02/29/2020   Squamous cell carcinoma of skin 01/20/2020   Superior perianal area. CIS, arising in  a condyloma, peripheral margin involved.    Review of systems:  Otherwise negative.    Physical Exam  Gen: Alert, oriented. Appears stated age.  HEENT: [place]/AT. PERRLA. Lungs: CTA, no wheezes. CV: RR nl S1, S2. Abd: soft, benign, no masses. BS+ Ext: No edema. Pulses 2+    Planned procedures: Proceed with colonoscopy. The patient understands the nature of the planned procedure, indications, risks, alternatives and potential complications including but not limited to bleeding, infection, perforation, damage to internal organs and possible oversedation/side effects from anesthesia. The patient agrees and gives consent to proceed.  Please refer to procedure notes for findings, recommendations and patient disposition/instructions.     Adreanne Yono Eric [name], M.D. Gastroenterology 11/16/2022  11:15 AM

## 2022-11-16 NOTE — Op Note (Signed)
[facility] Patient Name: Eric [name] Procedure Date: 11/16/2022 11:53 AM MRN: [id number] Account #: [id number] Date of Birth: 1955/08/28 Admit Type: Outpatient Age: 67 Room: [facility] ENDO ROOM 2 Gender: Male Note Status: Finalized Instrument Name: Prentice Docker [id number] Procedure:             Colonoscopy Indications:           Surveillance: Personal history of adenomatous polyps                         on last colonoscopy > 5 years ago Providers:             Royce Macadamia [name] MD, MD Referring MD:          Boykin Nearing. Norma Fredrickson MD, MD (Referring MD), Eustaquio Boyden (Referring MD) Medicines:             Propofol per Anesthesia Complications:         No immediate complications. Procedure:             Pre-Anesthesia Assessment:                        - The risks and benefits of the procedure and the                         sedation options and risks were discussed with the                         patient. All questions were answered and informed                         consent was obtained.                        - Patient identification and proposed procedure were                         verified prior to the procedure by the nurse. The                         procedure was verified in the procedure room.                        - ASA Grade Assessment: III - A patient with severe                         systemic disease.                        - After reviewing the risks and benefits, the patient                         was deemed in satisfactory condition to undergo the                         procedure.                        After  obtaining informed consent, the colonoscope was                         passed under direct vision. Throughout the procedure,                         the patient's blood pressure, pulse, and oxygen                         saturations were monitored continuously. The                          Colonoscope was introduced through the anus and                         advanced to the the cecum, identified by appendiceal                         orifice and ileocecal valve. The colonoscopy was                         performed without difficulty. The patient tolerated                         the procedure well. The quality of the bowel                         preparation was good. The ileocecal valve, appendiceal                         orifice, and rectum were photographed. Findings:      The perianal and digital rectal examinations were normal. Pertinent       negatives include normal sphincter tone and no palpable rectal lesions.      Non-bleeding internal hemorrhoids were found during retroflexion. The       hemorrhoids were Grade I (internal hemorrhoids that do not prolapse).      A 7 mm polyp was found in the distal transverse colon. The polyp was       sessile. The polyp was removed with a cold snare. Resection and       retrieval were complete.      Many medium-mouthed and small-mouthed diverticula were found in the       entire colon. There was no evidence of diverticular bleeding.      The exam was otherwise without abnormality. Impression:            - Non-bleeding internal hemorrhoids.                        - One 7 mm polyp in the distal transverse colon,                         removed with a cold snare. Resected and retrieved.                        - Mild diverticulosis in the entire examined colon.                         There was no evidence of  diverticular bleeding.                        - The examination was otherwise normal. Recommendation:        - Patient has a contact number available for                         emergencies. The signs and symptoms of potential                         delayed complications were discussed with the patient.                         Return to normal activities tomorrow. Written                         discharge instructions were  provided to the patient.                        - Resume previous diet.                        - Continue present medications.                        - Repeat colonoscopy is recommended for surveillance.                         The colonoscopy date will be determined after                         pathology results from today's exam become available                         for review.                        - Return to GI office PRN.                        - The findings and recommendations were discussed with                         the patient. Procedure Code(s):     --- Professional ---                        [phone], Colonoscopy, flexible; with removal of                         tumor(s), polyp(s), or other lesion(s) by snare                         technique Diagnosis Code(s):     --- Professional ---                        K57.30, Diverticulosis of large intestine without                         perforation or abscess without bleeding  K64.0, First degree hemorrhoids                        D12.3, Benign neoplasm of transverse colon (hepatic                         flexure or splenic flexure)                        Z86.010, Personal history of colonic polyps CPT copyright 2022 [employer]. All rights reserved. The codes documented in this report are preliminary and upon coder review may  be revised to meet current compliance requirements. Stanton Kidney MD, MD 11/16/2022 1:18:01 PM This report has been signed electronically. Number of Addenda: 0 Note Initiated On: 11/16/2022 11:53 AM Scope Withdrawal Time: 0 hours 10 minutes 13 seconds  Total Procedure Duration: 0 hours 14 minutes 15 seconds  Estimated Blood Loss:  Estimated blood loss: none.      [facility]

## 2022-11-16 NOTE — Anesthesia Postprocedure Evaluation (Signed)
Anesthesia Post Note  Patient: Eric [name]  Procedure(s) Performed: COLONOSCOPY  Patient location during evaluation: Endoscopy Anesthesia Type: General Level of consciousness: awake and alert Pain management: pain level controlled Vital Signs Assessment: post-procedure vital signs reviewed and stable Respiratory status: spontaneous breathing, nonlabored ventilation, respiratory function stable and patient connected to nasal cannula oxygen Cardiovascular status: blood pressure returned to baseline and stable Postop Assessment: no apparent nausea or vomiting Anesthetic complications: no   There were no known notable events for this encounter.   Last Vitals:  Vitals:   11/16/22 1329 11/16/22 1339  BP: (!) 130/56 125/77  Pulse: 84 76  Resp: 20 17  Temp:    SpO2: 94% 94%    Last Pain:  Vitals:   11/16/22 1339  TempSrc:   PainSc: 0-No pain                 Corinda Gubler

## 2022-11-16 NOTE — Transfer of Care (Signed)
Immediate Anesthesia Transfer of Care Note  Patient: Eric [name]  Procedure(s) Performed: COLONOSCOPY  Patient Location: PACU  Anesthesia Type:General  Level of Consciousness: awake and sedated  Airway & Oxygen Therapy: Patient Spontanous Breathing and Patient connected to face mask oxygen  Post-op Assessment: Report given to RN and Post -op Vital signs reviewed and stable  Post vital signs: Reviewed and stable  Last Vitals:  Vitals Value Taken Time  BP    Temp    Pulse    Resp    SpO2      Last Pain:  Vitals:   11/16/22 1211  TempSrc: Temporal  PainSc: 0-No pain         Complications: There were no known notable events for this encounter.

## 2022-11-17 ENCOUNTER — Encounter: Payer: Self-pay | Admitting: Family Medicine

## 2022-11-17 LAB — SURGICAL PATHOLOGY

## 2022-11-29 ENCOUNTER — Encounter: Payer: Self-pay | Admitting: Internal Medicine

## 2022-12-09 ENCOUNTER — Other Ambulatory Visit: Payer: Self-pay | Admitting: Cardiovascular Disease

## 2022-12-26 ENCOUNTER — Other Ambulatory Visit: Payer: Self-pay | Admitting: Family Medicine

## 2022-12-26 DIAGNOSIS — E1169 Type 2 diabetes mellitus with other specified complication: Secondary | ICD-10-CM

## 2023-01-01 ENCOUNTER — Other Ambulatory Visit: Payer: Self-pay | Admitting: Cardiovascular Disease

## 2023-01-02 ENCOUNTER — Telehealth: Payer: Self-pay | Admitting: *Deleted

## 2023-01-02 DIAGNOSIS — R079 Chest pain, unspecified: Secondary | ICD-10-CM

## 2023-01-02 NOTE — Telephone Encounter (Signed)
Ran into patient this weekend and would like for Dr Mariah Milling to schedule him for a nuclear stress test for his renewal for his [employer] license.  Please call patient to discuss

## 2023-01-03 ENCOUNTER — Telehealth: Payer: Self-pay | Admitting: Emergency Medicine

## 2023-01-03 NOTE — Telephone Encounter (Signed)
Called and spoke with patient. Patient informed that Eric [name] has been ordered. Patient given instructions for the Lexiscan. Patient verbalizes understanding.

## 2023-01-04 ENCOUNTER — Telehealth: Payer: Self-pay | Admitting: Cardiovascular Disease

## 2023-01-04 ENCOUNTER — Encounter: Payer: Self-pay | Admitting: Emergency Medicine

## 2023-01-04 NOTE — Telephone Encounter (Signed)
Patient scheduled nuclear medicine stress test for 6/27, please call with instructions.

## 2023-01-04 NOTE — Telephone Encounter (Signed)
Called and spoke with patient. Gave verbal instructions for [facility]. Patient verbalized understanding. Will mail a copy of instructions to patient.

## 2023-01-19 ENCOUNTER — Encounter
Admission: RE | Admit: 2023-01-19 | Discharge: 2023-01-19 | Disposition: A | Discharge status: Home / Self Care | Payer: Medicare HMO | Source: Ambulatory Visit | Attending: Cardiovascular Disease | Admitting: Cardiovascular Disease

## 2023-01-19 DIAGNOSIS — R079 Chest pain, unspecified: Secondary | ICD-10-CM | POA: Insufficient documentation

## 2023-01-19 LAB — NM MYOCAR MULTI W/SPECT W/WALL MOTION / EF
LV dias vol: 114 mL (ref 62–150)
LV sys vol: 42 mL
Nuc Stress EF: 63 %
Peak HR: 86 {beats}/min
Rest HR: 71 {beats}/min
Rest Nuclear Isotope Dose: 10 mCi
SDS: 0
SRS: 1
SSS: 0
ST Depression (mm): 0 mm
Stress Nuclear Isotope Dose: 30.8 mCi
TID: 1.24

## 2023-01-19 MED ORDER — REGADENOSON 0.4 MG/5ML IV SOLN
0.4000 mg | Freq: Once | INTRAVENOUS | Status: AC
  Administered 2023-01-19: 0.4 mg via INTRAVENOUS
  Filled 2023-01-19: qty 5

## 2023-01-19 MED ORDER — TECHNETIUM TC 99M TETROFOSMIN IV KIT
10.0000 | PACK | Freq: Once | INTRAVENOUS | Status: AC | PRN
  Administered 2023-01-19: 10.03 via INTRAVENOUS

## 2023-01-19 MED ORDER — TECHNETIUM TC 99M TETROFOSMIN IV KIT
30.8100 | PACK | Freq: Once | INTRAVENOUS | Status: AC | PRN
  Administered 2023-01-19: 30.81 via INTRAVENOUS

## 2023-02-06 ENCOUNTER — Encounter: Payer: Self-pay | Admitting: Family Medicine

## 2023-02-06 ENCOUNTER — Ambulatory Visit: Payer: Medicare HMO | Admitting: Family Medicine

## 2023-02-06 VITALS — BP 138/84 | HR 75 | Temp 97.9°F | Ht 69.0 in | Wt 235.4 lb

## 2023-02-06 DIAGNOSIS — W57XXXA Bitten or stung by nonvenomous insect and other nonvenomous arthropods, initial encounter: Secondary | ICD-10-CM | POA: Diagnosis not present

## 2023-02-06 DIAGNOSIS — S161XXA Strain of muscle, fascia and tendon at neck level, initial encounter: Secondary | ICD-10-CM | POA: Diagnosis not present

## 2023-02-06 DIAGNOSIS — S20462A Insect bite (nonvenomous) of left back wall of thorax, initial encounter: Secondary | ICD-10-CM

## 2023-02-06 DIAGNOSIS — R21 Rash and other nonspecific skin eruption: Secondary | ICD-10-CM | POA: Diagnosis not present

## 2023-02-06 DIAGNOSIS — G8929 Other chronic pain: Secondary | ICD-10-CM | POA: Insufficient documentation

## 2023-02-06 DIAGNOSIS — F4320 Adjustment disorder, unspecified: Secondary | ICD-10-CM

## 2023-02-06 DIAGNOSIS — M542 Cervicalgia: Secondary | ICD-10-CM | POA: Insufficient documentation

## 2023-02-06 DIAGNOSIS — N529 Male erectile dysfunction, unspecified: Secondary | ICD-10-CM | POA: Diagnosis not present

## 2023-02-06 MED ORDER — TRAZODONE HCL 50 MG PO TABS: 25.0000 mg | ORAL_TABLET | Freq: Every evening | ORAL | 1 refills | Status: DC | PRN

## 2023-02-06 MED ORDER — TRIAMCINOLONE ACETONIDE 0.1 % EX CREA: 1.0000 | TOPICAL_CREAM | Freq: Two times a day (BID) | CUTANEOUS | 0 refills | Status: AC

## 2023-02-06 MED ORDER — CYCLOBENZAPRINE HCL 10 MG PO TABS: 10.0000 mg | ORAL_TABLET | Freq: Three times a day (TID) | ORAL | 1 refills | Status: DC | PRN

## 2023-02-06 MED ORDER — SILDENAFIL CITRATE 100 MG PO TABS: 50.0000 mg | ORAL_TABLET | Freq: Every day | ORAL | 3 refills | Status: DC | PRN

## 2023-02-06 NOTE — Assessment & Plan Note (Signed)
Possible contact dermatitis to poison oak.  Rx topical triamcinolone steroid cream.

## 2023-02-06 NOTE — Patient Instructions (Addendum)
Tick bite looks ok.  For skin rash - try prescription strength steroid cream sent to pharmacy - use on affected areas.  For neck - you likely strained your neck. Do exercises provided today. Take flexeril 10mg  tablet up to twice daily as needed for neck pain, with sedation precautions.

## 2023-02-06 NOTE — Assessment & Plan Note (Signed)
Refill trazodone which he uses PRN insomnia.

## 2023-02-06 NOTE — Assessment & Plan Note (Addendum)
Present for 1-2 months.  Reassuring exam.  Supportive measures reviewed - rec flexeril, heating pad, gentle stretching.  Handout provided with exercises for neck strain.  Update if not improving with treatment.

## 2023-02-06 NOTE — Progress Notes (Signed)
Ph: [phone] Fax: [phone]   Patient ID: Eric [name], male    DOB: [birth year], 67 y.o.   MRN: [id number]  This visit was conducted in person.  BP 138/84   Pulse 75   Temp 97.9 F (36.6 C) (Temporal)   Ht 5\' 9"  (1.753 m)   Wt 235 lb 6 oz (106.8 kg)   SpO2 95%   BMI 34.76 kg/m    CC: tick bite  Subjective:   HPI: [name] is a 67 y.o. male presenting on 02/06/2023 for Insect Bite (C/o tick bite. Noticed about 3 wks ago. Also, noticed rash. ) and Neck Injury (C/o L side neck pain after working on tractor about 6 wks ago. Felt something pop. Also feels, pain in left side since neck injury. )   Tick bite 3 wks ago 01/14/2023. Attached <2d. Wanted to get this checked out.  No fevers/chills, new joint pains, headache, abd pain, nausea, fatigue.   Notes itchy rash to all extremities, predominantly left forearm - this was present prior to tick bite. Started at left anterior elbow. Treated with otc calomine lotion, cortizone-10, alcohol. Overall improving but still somewhat itchy. ?poison oak exposure as it started after mowing large area with tall grass.   Also 1-2 months ago while changing tractor oil felt a pop to left lateral neck. Residual pain since then, left neck and shoulder feels stiff. Hasn't tried anything for this yet. No cervical radiculopathy  H/o bilateral shoulder surgeries.      Relevant past medical, surgical, family and social history reviewed and updated as indicated. Interim medical history since our last visit reviewed. Allergies and medications reviewed and updated. Outpatient Medications Prior to Visit  Medication Sig Dispense Refill   acetaminophen (TYLENOL) 500 MG tablet Take 2 tablets (1,000 mg total) by mouth every 8 (eight) hours. 90 tablet 2   Ascorbic Acid (VITAMIN C) 1000 MG tablet Take 1,000 mg by mouth every evening.      aspirin EC 81 MG tablet Take 162 mg by mouth daily. Swallow whole.     cholecalciferol (VITAMIN D3) 25 MCG  (1000 UT) tablet Take 1,000 Units by mouth every evening.      ciprofloxacin (CIPRO) 500 MG tablet Take 1 tablet (500 mg total) by mouth 2 (two) times daily as needed (Diverticulitis). 20 tablet 1   clopidogrel (PLAVIX) 75 MG tablet Take 1 tablet (75 mg total) by mouth daily. 90 tablet 3   Coenzyme Q10 200 MG capsule Take 200 mg by mouth daily in the afternoon.      diazepam (VALIUM) 5 MG tablet Take 5 mg by mouth as directed.     diphenhydrAMINE (BENADRYL) 25 MG tablet Take 25 mg by mouth every 6 (six) hours as needed for allergies.      docusate sodium (COLACE) 100 MG capsule Take 100 mg by mouth daily.     EPINEPHrine 0.3 mg/0.3 mL IJ SOAJ injection INJECT IN THE MUSCLE AS DIRECTED 4 each 0   famotidine (PEPCID) 20 MG tablet Take 1 tablet (20 mg total) by mouth 2 (two) times daily. 180 tablet 3   fexofenadine (ALLEGRA) 180 MG tablet Take 1 tablet (180 mg total) by mouth daily. 90 tablet 3   hydrochlorothiazide (HYDRODIURIL) 25 MG tablet TAKE 1 TABLET BY MOUTH DAILY 90 tablet 0   hydrocortisone 2.5 % cream Apply topically 2 (two) times daily as needed.     hydrocortisone 2.5 % cream Apply to affected areas at eyebrows and forehead  daily on T, Thur, Saturday. 30 g 11   ipratropium (ATROVENT) 0.06 % nasal spray Place 2 sprays into both nostrils 4 (four) times daily. 15 mL 12   ketoconazole (NIZORAL) 2 % cream Apply topically to aa's of eyebrows and forehead once daily M-W-F 30 g 11   losartan (COZAAR) 100 MG tablet TAKE 1 TABLET(100 MG) BY MOUTH DAILY 90 tablet 1   metFORMIN (GLUCOPHAGE) 500 MG tablet TAKE THREE TABLETS BY MOUTH EVERY MORNING WITH BREAKFAST 270 tablet 0   Methylcellulose, Laxative, (CITRUCEL) 500 MG TABS Take 1,000 mg by mouth at bedtime.     Multiple Vitamin (MULTIVITAMIN WITH MINERALS) TABS tablet Take 1 tablet by mouth every evening.     nitroGLYCERIN (NITROSTAT) 0.4 MG SL tablet Place 1 tablet (0.4 mg total) under the tongue every 5 (five) minutes x 3 doses as needed for chest  pain. PLACE 1 TABLET UNDER THE TONGUE IF NEEDED EVERY 5 MINUTES FOR CHEST PAIN FOR 3 DOSES IF NO RELIEF AFER FIRST DOSE CALL PRESCRIBER OR 911 25 tablet 3   Omega-3 Fatty Acids (FISH OIL CONCENTRATE) 1000 MG CAPS Take 3,000 mg by mouth every evening.      ondansetron (ZOFRAN-ODT) 4 MG disintegrating tablet Take 1 tablet (4 mg total) by mouth every 8 (eight) hours as needed for nausea or vomiting. 20 tablet 0   ondansetron (ZOFRAN-ODT) 4 MG disintegrating tablet Take 1 tablet (4 mg total) by mouth every 8 (eight) hours as needed for nausea or vomiting. 20 tablet 0   oxyCODONE (ROXICODONE) 5 MG immediate release tablet Take 1-2 tablets (5-10 mg total) by mouth every 4 (four) hours as needed (pain). 30 tablet 0   OZEMPIC, 0.25 OR 0.5 MG/DOSE, 2 MG/3ML SOPN Inject 0.5 mg into the skin once a week. 9 mL 3   pantoprazole (PROTONIX) 40 MG tablet Take 1 tablet (40 mg total) by mouth daily. 90 tablet 3   promethazine-dextromethorphan (PROMETHAZINE-DM) 6.25-15 MG/5ML syrup Take 5 mLs by mouth 4 (four) times daily as needed. 118 mL 0   propranolol (INDERAL) 20 MG tablet Take 1 tablet (20 mg total) by mouth 3 (three) times daily as needed (tachycardia). 30 tablet 3   rosuvastatin (CRESTOR) 40 MG tablet Take 1 tablet (40 mg total) by mouth daily. 90 tablet 3   TURMERIC PO Take 1,000 mg by mouth in the morning and at bedtime.     cyclobenzaprine (FLEXERIL) 10 MG tablet Take 1 tablet (10 mg total) by mouth 3 (three) times daily as needed for muscle spasms. 30 tablet 1   sildenafil (VIAGRA) 100 MG tablet Take 0.5-1 tablets (50-100 mg total) by mouth daily as needed for erectile dysfunction. 20 tablet 3   traZODone (DESYREL) 50 MG tablet Take 0.5-1 tablets (25-50 mg total) by mouth at bedtime as needed for sleep. 90 tablet 3   No facility-administered medications prior to visit.     Per HPI unless specifically indicated in ROS section below Review of Systems  Objective:  BP 138/84   Pulse 75   Temp 97.9 F  (36.6 C) (Temporal)   Ht 5\' 9"  (1.753 m)   Wt 235 lb 6 oz (106.8 kg)   SpO2 95%   BMI 34.76 kg/m   Wt Readings from Last 3 Encounters:  02/06/23 235 lb 6 oz (106.8 kg)  11/16/22 250 lb (113.4 kg)  10/04/22 254 lb (115.2 kg)      Physical Exam Vitals and nursing note reviewed.  Constitutional:      Appearance:  Normal appearance. He is not ill-appearing.  HENT:     Head: Normocephalic and atraumatic.     Mouth/Throat:     Mouth: Mucous membranes are moist.     Pharynx: Oropharynx is clear. No posterior oropharyngeal erythema.  Eyes:     Extraocular Movements: Extraocular movements intact.     Pupils: Pupils are equal, round, and reactive to light.  Neck:     Comments:  No midline cervical neck pain No pain at occiput Tender L paracervical mm to palpation  No significant pain to trapezius or rhomboids  Cardiovascular:     Rate and Rhythm: Normal rate and regular rhythm.     Pulses: Normal pulses.     Heart sounds: Normal heart sounds. No murmur heard. Pulmonary:     Effort: Pulmonary effort is normal. No respiratory distress.     Breath sounds: Normal breath sounds. No wheezing, rhonchi or rales.  Musculoskeletal:     Cervical back: Normal range of motion and neck supple. Tenderness present. No rigidity.     Right lower leg: No edema.     Left lower leg: No edema.  Skin:    General: Skin is warm and dry.     Findings: Rash present.     Comments:  Itchy rough patches to left dorsal forearm and left lateral lower leg  Few faint pruritic papules to extremities otherwise  Neurological:     Mental Status: He is alert.  Psychiatric:        Mood and Affect: Mood normal.        Behavior: Behavior normal.       Results for orders placed or performed during the hospital encounter of 01/19/23  NM Myocar Multi W/Spect W/Wall Motion / EF  Result Value Ref Range   Rest HR 71.0 bpm   Rest BP 127/83 mmHg   Peak HR 86 bpm   Peak BP 135/82 mmHg   ST Depression (mm) 0 mm    Rest Nuclear Isotope Dose 10.0 mCi   Stress Nuclear Isotope Dose 30.8 mCi   SSS 0.0    SRS 1.0    SDS 0.0    TID 1.24    LV sys vol 42.0 mL   LV dias vol 114.0 62 - 150 mL   Nuc Stress EF 63 %    Assessment & Plan:   Problem List Items Addressed This Visit     Adjustment disorder    Refill trazodone which he uses PRN insomnia.       Tick bite of left back wall of thorax - Primary    Tick bite 3 wks ago to left scapular region. Likely not attached long enough to transmit rickettsial infection.  No signs of tick borne illness Reassurance provided.       Skin rash    Possible contact dermatitis to poison oak.  Rx topical triamcinolone steroid cream.       Neck strain, initial encounter    Present for 1-2 months.  Reassuring exam.  Supportive measures reviewed - rec flexeril, heating pad, gentle stretching.  Handout provided with exercises for neck strain.  Update if not improving with treatment.       Erectile dysfunction    Rx viagra PRN.  Pt aware he cannot mix with nitroglycerin.         Meds ordered this encounter  Medications   triamcinolone cream (KENALOG) 0.1 %    Sig: Apply 1 Application topically 2 (two) times daily. Apply to AA.  Dispense:  45 g    Refill:  0   cyclobenzaprine (FLEXERIL) 10 MG tablet    Sig: Take 1 tablet (10 mg total) by mouth 3 (three) times daily as needed for muscle spasms.    Dispense:  30 tablet    Refill:  1   traZODone (DESYREL) 50 MG tablet    Sig: Take 0.5-1 tablets (25-50 mg total) by mouth at bedtime as needed for sleep.    Dispense:  90 tablet    Refill:  1   sildenafil (VIAGRA) 100 MG tablet    Sig: Take 0.5-1 tablets (50-100 mg total) by mouth daily as needed for erectile dysfunction.    Dispense:  20 tablet    Refill:  3    No orders of the defined types were placed in this encounter.   Patient Instructions  Tick bite looks ok.  For skin rash - try prescription strength steroid cream sent to pharmacy -  use on affected areas.  For neck - you likely strained your neck. Do exercises provided today. Take flexeril 10mg  tablet up to twice daily as needed for neck pain, with sedation precautions.   Follow up plan: No follow-ups on file.  Eustaquio Boyden, MD

## 2023-02-06 NOTE — Assessment & Plan Note (Signed)
Rx viagra PRN.  Pt aware he cannot mix with nitroglycerin.

## 2023-02-06 NOTE — Assessment & Plan Note (Signed)
Tick bite 3 wks ago to left scapular region. Likely not attached long enough to transmit rickettsial infection.  No signs of tick borne illness Reassurance provided.

## 2023-03-03 DIAGNOSIS — E1136 Type 2 diabetes mellitus with diabetic cataract: Secondary | ICD-10-CM | POA: Diagnosis not present

## 2023-03-03 DIAGNOSIS — M1990 Unspecified osteoarthritis, unspecified site: Secondary | ICD-10-CM | POA: Diagnosis not present

## 2023-03-03 DIAGNOSIS — I10 Essential (primary) hypertension: Secondary | ICD-10-CM | POA: Diagnosis not present

## 2023-03-03 DIAGNOSIS — Z809 Family history of malignant neoplasm, unspecified: Secondary | ICD-10-CM | POA: Diagnosis not present

## 2023-03-03 DIAGNOSIS — J309 Allergic rhinitis, unspecified: Secondary | ICD-10-CM | POA: Diagnosis not present

## 2023-03-03 DIAGNOSIS — Z8249 Family history of ischemic heart disease and other diseases of the circulatory system: Secondary | ICD-10-CM | POA: Diagnosis not present

## 2023-03-03 DIAGNOSIS — G4700 Insomnia, unspecified: Secondary | ICD-10-CM | POA: Diagnosis not present

## 2023-03-03 DIAGNOSIS — K219 Gastro-esophageal reflux disease without esophagitis: Secondary | ICD-10-CM | POA: Diagnosis not present

## 2023-03-03 DIAGNOSIS — E785 Hyperlipidemia, unspecified: Secondary | ICD-10-CM | POA: Diagnosis not present

## 2023-03-03 DIAGNOSIS — Z008 Encounter for other general examination: Secondary | ICD-10-CM | POA: Diagnosis not present

## 2023-03-03 DIAGNOSIS — Z87891 Personal history of nicotine dependence: Secondary | ICD-10-CM | POA: Diagnosis not present

## 2023-03-03 DIAGNOSIS — Z7984 Long term (current) use of oral hypoglycemic drugs: Secondary | ICD-10-CM | POA: Diagnosis not present

## 2023-03-03 DIAGNOSIS — I2510 Atherosclerotic heart disease of native coronary artery without angina pectoris: Secondary | ICD-10-CM | POA: Diagnosis not present

## 2023-03-09 ENCOUNTER — Encounter: Payer: Self-pay | Admitting: Dermatology

## 2023-03-09 ENCOUNTER — Ambulatory Visit: Payer: Medicare HMO | Admitting: Dermatology

## 2023-03-09 VITALS — BP 139/82

## 2023-03-09 DIAGNOSIS — Z86007 Personal history of in-situ neoplasm of skin: Secondary | ICD-10-CM

## 2023-03-09 DIAGNOSIS — L719 Rosacea, unspecified: Secondary | ICD-10-CM

## 2023-03-09 DIAGNOSIS — Z79899 Other long term (current) drug therapy: Secondary | ICD-10-CM | POA: Diagnosis not present

## 2023-03-09 DIAGNOSIS — D229 Melanocytic nevi, unspecified: Secondary | ICD-10-CM

## 2023-03-09 DIAGNOSIS — L918 Other hypertrophic disorders of the skin: Secondary | ICD-10-CM | POA: Diagnosis not present

## 2023-03-09 DIAGNOSIS — D2261 Melanocytic nevi of right upper limb, including shoulder: Secondary | ICD-10-CM | POA: Diagnosis not present

## 2023-03-09 DIAGNOSIS — L578 Other skin changes due to chronic exposure to nonionizing radiation: Secondary | ICD-10-CM

## 2023-03-09 DIAGNOSIS — L219 Seborrheic dermatitis, unspecified: Secondary | ICD-10-CM | POA: Diagnosis not present

## 2023-03-09 DIAGNOSIS — W908XXA Exposure to other nonionizing radiation, initial encounter: Secondary | ICD-10-CM

## 2023-03-09 DIAGNOSIS — L814 Other melanin hyperpigmentation: Secondary | ICD-10-CM | POA: Diagnosis not present

## 2023-03-09 DIAGNOSIS — Z1283 Encounter for screening for malignant neoplasm of skin: Secondary | ICD-10-CM | POA: Diagnosis not present

## 2023-03-09 DIAGNOSIS — L821 Other seborrheic keratosis: Secondary | ICD-10-CM

## 2023-03-09 DIAGNOSIS — L905 Scar conditions and fibrosis of skin: Secondary | ICD-10-CM | POA: Diagnosis not present

## 2023-03-09 DIAGNOSIS — Z8589 Personal history of malignant neoplasm of other organs and systems: Secondary | ICD-10-CM

## 2023-03-09 DIAGNOSIS — D239 Other benign neoplasm of skin, unspecified: Secondary | ICD-10-CM

## 2023-03-09 DIAGNOSIS — D492 Neoplasm of unspecified behavior of bone, soft tissue, and skin: Secondary | ICD-10-CM

## 2023-03-09 DIAGNOSIS — D1801 Hemangioma of skin and subcutaneous tissue: Secondary | ICD-10-CM

## 2023-03-09 DIAGNOSIS — L729 Follicular cyst of the skin and subcutaneous tissue, unspecified: Secondary | ICD-10-CM

## 2023-03-09 DIAGNOSIS — L720 Epidermal cyst: Secondary | ICD-10-CM

## 2023-03-09 HISTORY — DX: Other benign neoplasm of skin, unspecified: D23.9

## 2023-03-09 MED ORDER — HYDROCORTISONE 2.5 % EX CREA: TOPICAL_CREAM | CUTANEOUS | 11 refills | Status: DC

## 2023-03-09 MED ORDER — KETOCONAZOLE 2 % EX CREA: 1.0000 | TOPICAL_CREAM | CUTANEOUS | 11 refills | Status: DC

## 2023-03-09 NOTE — Patient Instructions (Addendum)
Wound Care Instructions  Cleanse wound gently with soap and water once a day then pat dry with clean gauze. Apply a thin coat of Petrolatum (petroleum jelly, "Vaseline") over the wound (unless you have an allergy to this). We recommend that you use a new, sterile tube of Vaseline. Do not pick or remove scabs. Do not remove the yellow or white "healing tissue" from the base of the wound.  Cover the wound with fresh, clean, nonstick gauze and secure with paper tape. You may use Band-Aids in place of gauze and tape if the wound is small enough, but would recommend trimming much of the tape off as there is often too much. Sometimes Band-Aids can irritate the skin.  You should call the office for your biopsy report after 1 week if you have not already been contacted.  If you experience any problems, such as abnormal amounts of bleeding, swelling, significant bruising, significant pain, or evidence of infection, please call the office immediately.  FOR ADULT SURGERY PATIENTS: If you need something for pain relief you may take 1 extra strength Tylenol (acetaminophen) AND 2 Ibuprofen (200mg  each) together every 4 hours as needed for pain. (do not take these if you are allergic to them or if you have a reason you should not take them.) Typically, you may only need pain medication for 1 to 3 days.      Due to recent changes in healthcare laws, you may see results of your pathology and/or laboratory studies on MyChart before the doctors have had a chance to review them. We understand that in some cases there may be results that are confusing or concerning to you. Please understand that not all results are received at the same time and often the doctors may need to interpret multiple results in order to provide you with the best plan of care or course of treatment. Therefore, we ask that you please give [place] 2 business days to thoroughly review all your results before contacting the office for clarification. Should  we see a critical lab result, you will be contacted sooner.   If You Need Anything After Your Visit  If you have any questions or concerns for your doctor, please call our main line at [phone] and press option 4 to reach your doctor's medical assistant. If no one answers, please leave a voicemail as directed and we will return your call as soon as possible. Messages left after 4 pm will be answered the following business day.   You may also send [place] a message via MyChart. We typically respond to MyChart messages within 1-2 business days.  For prescription refills, please ask your pharmacy to contact our office. Our fax number is [phone].  If you have an urgent issue when the clinic is closed that cannot wait until the next business day, you can page your doctor at the number below.    Please note that while we do our best to be available for urgent issues outside of office hours, we are not available 24/7.   If you have an urgent issue and are unable to reach [place], you may choose to seek medical care at your doctor's office, retail clinic, urgent care center, or emergency room.  If you have a medical emergency, please immediately call 911 or go to the emergency department.  Pager Numbers  - Dr. Gwen Pounds: [phone]  - Dr. Roseanne Reno: [phone]  - Dr. Katrinka Blazing: [phone]   In the event of inclement weather, please call our main  line at [phone] for an update on the status of any delays or closures.  Dermatology Medication Tips: Please keep the boxes that topical medications come in in order to help keep track of the instructions about where and how to use these. Pharmacies typically print the medication instructions only on the boxes and not directly on the medication tubes.   If your medication is too expensive, please contact our office at [phone] option 4 or send [place] a message through MyChart.   We are unable to tell what your co-pay for medications will be in  advance as this is different depending on your insurance coverage. However, we may be able to find a substitute medication at lower cost or fill out paperwork to get insurance to cover a needed medication.   If a prior authorization is required to get your medication covered by your insurance company, please allow [place] 1-2 business days to complete this process.  Drug prices often vary depending on where the prescription is filled and some pharmacies may offer cheaper prices.  The website www.goodrx.com contains coupons for medications through different pharmacies. The prices here do not account for what the cost may be with help from insurance (it may be cheaper with your insurance), but the website can give you the price if you did not use any insurance.  - You can print the associated coupon and take it with your prescription to the pharmacy.  - You may also stop by our office during regular business hours and pick up a GoodRx coupon card.  - If you need your prescription sent electronically to a different pharmacy, notify our office through [facility] or by phone at [phone] option 4.     Si Usted Necesita Algo Despus de Su Visita  Tambin puede enviarnos un mensaje a travs de Clinical cytogeneticist. Por lo general respondemos a los mensajes de MyChart en el transcurso de 1 a 2 das hbiles.  Para renovar recetas, por favor pida a su farmacia que se ponga en contacto con nuestra oficina. Annie Sable de fax es [place] [phone].  Si tiene un asunto urgente cuando la clnica est cerrada y que no puede esperar hasta el siguiente da hbil, puede llamar/localizar a su doctor(a) al nmero que aparece a continuacin.   Por favor, tenga en cuenta que aunque hacemos todo lo posible para estar disponibles para asuntos urgentes fuera del horario de [employer], no estamos disponibles las 24 horas del da, los 7 [address]  [address] de la [place].   Si tiene un problema urgente y no puede comunicarse con nosotros, puede  optar por buscar atencin mdica  en el consultorio de su doctor(a), en una clnica privada, en un centro de atencin urgente o en una sala de emergencias.  Si tiene Engineer, drilling, por favor llame inmediatamente al 911 o vaya a la sala de emergencias.  Nmeros de bper  - Dr. Gwen Pounds: [phone]  - Dra. Roseanne Reno: [phone]  - Dr. Katrinka Blazing: [phone]   En caso de inclemencias del tiempo, por favor llame a Lacy Duverney principal al [phone] para una actualizacin sobre el [place] de cualquier retraso o cierre.  Consejos para la medicacin en dermatologa: Por favor, guarde las cajas en las que vienen los medicamentos de uso tpico para ayudarle a seguir las instrucciones sobre dnde y cmo usarlos. Las farmacias generalmente imprimen las instrucciones del medicamento slo en las cajas y no directamente en los tubos del [place].   Si su medicamento es [address], por favor, pngase  en contacto con nuestra oficina llamando al [phone] y presione la opcin 4 o envenos un mensaje a travs de Clinical cytogeneticist.   No podemos decirle cul ser su copago por los medicamentos por adelantado ya que esto es diferente dependiendo de la cobertura de su seguro. Sin embargo, es posible que podamos encontrar un medicamento sustituto a Audiological scientist un formulario para que el seguro cubra el medicamento que se considera necesario.   Si se requiere una autorizacin previa para que su compaa de seguros [place] su medicamento, por favor permtanos de 1 a 2 das hbiles para completar [address]  Los precios de los medicamentos varan con frecuencia dependiendo del Environmental consultant de dnde se surte la receta y alguna farmacias pueden ofrecer precios ms baratos.  El sitio web www.goodrx.com tiene cupones para medicamentos de Health and safety inspector. Los precios aqu no tienen en cuenta lo que podra costar con la ayuda del seguro (puede ser ms barato con su seguro), pero el sitio web puede darle el  precio si no utiliz Tourist information centre manager.  - Puede imprimir el cupn correspondiente y llevarlo con su receta a la farmacia.  - Tambin puede pasar por nuestra oficina durante el horario de atencin regular y Education officer, museum una tarjeta de cupones de GoodRx.  - Si necesita que su receta se enve electrnicamente a una farmacia diferente, informe a nuestra oficina a travs de MyChart de [place] o por telfono llamando al [phone] y presione la opcin 4.

## 2023-03-09 NOTE — Progress Notes (Signed)
Follow-Up Visit   Subjective  Eric [name] is a 67 y.o. male who presents for the following: Skin Cancer Screening and Full Body Skin Exam, hx of SCC IS, Seb Derm face, Ketoconazole 2% cr prn, HC 2.5% cr prn, check spot L infraocular, > 57yr, check growth L ear, long hx, pt scratches at  The patient presents for Total-Body Skin Exam (TBSE) for skin cancer screening and mole check. The patient has spots, moles and lesions to be evaluated, some may be new or changing and the patient may have concern these could be cancer.    The following portions of the chart were reviewed this encounter and updated as appropriate: medications, allergies, medical history  Review of Systems:  No other skin or systemic complaints except as noted in HPI or Assessment and Plan.  Objective  Well appearing patient in no apparent distress; mood and affect are within normal limits.  A full examination was performed including scalp, head, eyes, ears, nose, lips, neck, chest, axillae, abdomen, back, buttocks, bilateral upper extremities, bilateral lower extremities, hands, feet, fingers, toes, fingernails, and toenails. All findings within normal limits unless otherwise noted below.   Relevant physical exam findings are noted in the Assessment and Plan.  R post deltoid lat to surgery scar 0.6cm irregular brown macule         Assessment & Plan   SKIN CANCER SCREENING PERFORMED TODAY.  ACTINIC DAMAGE - Chronic condition, secondary to cumulative UV/sun exposure - diffuse scaly erythematous macules with underlying dyspigmentation - Recommend daily broad spectrum sunscreen SPF 30+ to sun-exposed areas, reapply every 2 hours as needed.  - Staying in the shade or wearing long sleeves, sun glasses (UVA+UVB protection) and wide brim hats (4-inch brim around the entire circumference of the hat) are also recommended for sun protection.  - Call for new or changing lesions.  LENTIGINES, SEBORRHEIC KERATOSES,  HEMANGIOMAS - Benign normal skin lesions - Benign-appearing - Call for any changes  MELANOCYTIC NEVI - Tan-brown and/or pink-flesh-colored symmetric macules and papules - Benign appearing on exam today - Observation - Call clinic for new or changing moles - Recommend daily use of broad spectrum spf 30+ sunscreen to sun-exposed areas.   HISTORY OF SQUAMOUS CELL CARCINOMA IN SITU OF THE SKIN - No evidence of recurrence today - Recommend regular full body skin exams - Recommend daily broad spectrum sunscreen SPF 30+ to sun-exposed areas, reapply every 2 hours as needed.  - Call if any new or changing lesions are noted between office visits  - Superior perianal area. SCCIS, arising in a condyloma    SEBORRHEIC DERMATITIS face Exam: face clear  Chronic condition with duration or expected duration over one year. Currently well-controlled.   Seborrheic Dermatitis is a chronic persistent rash characterized by pinkness and scaling most commonly of the mid face but also can occur on the scalp (dandruff), ears; mid chest, mid back and groin.  It tends to be exacerbated by stress and cooler weather.  People who have neurologic disease may experience new onset or exacerbation of existing seborrheic dermatitis.  The condition is not curable but treatable and can be controlled.  Treatment Plan: Cont Ketoconazole 2% cr 3d/wk, Monday, Wednesday, Friday  Cont HC 2.5% cr 3d/wk, Tuesday, Thursday, Saturday Topical steroids (such as triamcinolone, fluocinolone, fluocinonide, mometasone, clobetasol, halobetasol, betamethasone, hydrocortisone) can cause thinning and lightening of the skin if they are used for too long in the same area. Your physician has selected the right strength medicine for your  problem and area affected on the body. Please use your medication only as directed by your physician to prevent side effects.    Long term medication management.  Patient is using long term (months to years)  prescription medication  to control their dermatologic condition.  These medications require periodic monitoring to evaluate for efficacy and side effects and may require periodic laboratory monitoring.    Neoplasm of skin R post deltoid lat to surgery scar  Epidermal / dermal shaving  Lesion diameter (cm):  0.6 Informed consent: discussed and consent obtained   Timeout: patient name, date of birth, surgical site, and procedure verified   Procedure prep:  Patient was prepped and draped in usual sterile fashion Prep type:  Isopropyl alcohol Anesthesia: the lesion was anesthetized in a standard fashion   Anesthetic:  1% lidocaine w/ epinephrine 1-100,000 buffered w/ 8.4% NaHCO3 Instrument used: flexible razor blade   Hemostasis achieved with: pressure, aluminum chloride and electrodesiccation   Outcome: patient tolerated procedure well   Post-procedure details: sterile dressing applied and wound care instructions given   Dressing type: bandage and bacitracin    Specimen 1 - Surgical pathology Differential Diagnosis: D48.5 Nevus vs Dysplastic Nevus  Check Margins: yes 0.6cm irregular brown macule    Milia - tiny firm white papules - type of cyst - benign - sometimes these will clear with nightly OTC adapalene/Differin 0.1% gel or retinol. - may be extracted if symptomatic - observe   ROSACEA face Exam Face with erythema and telangiectasias   Rosacea is a chronic progressive skin condition usually affecting the face of adults, causing redness and/or acne bumps. It is treatable but not curable. It sometimes affects the eyes (ocular rosacea) as well. It may respond to topical and/or systemic medication and can flare with stress, sun exposure, alcohol, exercise, topical steroids (including hydrocortisone/cortisone 10) and some foods.  Daily application of broad spectrum spf 30+ sunscreen to face is recommended to reduce flares.   Treatment Plan Counseling for BBL / IPL / Laser  and Coordination of Care Discussed the treatment option of Broad Band Light (BBL) /Intense Pulsed Light (IPL)/ Laser for skin discoloration, including brown spots and redness.  Typically we recommend at least 1-3 treatment sessions about 5-8 weeks apart for best results.  Cannot have tanned skin when BBL performed, and regular use of sunscreen/photoprotection is advised after the procedure to help maintain results. The patient's condition may also require "maintenance treatments" in the future.  The fee for BBL / laser treatments is $350 per treatment session for the whole face.  A fee can be quoted for other parts of the body.  Insurance typically does not pay for BBL/laser treatments and therefore the fee is an out-of-pocket cost. Recommend prophylactic valtrex treatment. Once scheduled for procedure, will send Rx in prior to patient's appointment.    Acrochordons (Skin Tags) - Fleshy, skin-colored pedunculated papules - Benign appearing.  - Observe. - If desired, they can be removed with an in office procedure that is not covered by insurance. - Please call the clinic if you notice any new or changing lesions.    Return in about 1 year (around 03/08/2024) for TBSE, Hx of SCC.  I, Ardis Rowan, RMA, am acting as scribe for Armida Sans, MD .   Documentation: I have reviewed the above documentation for accuracy and completeness, and I agree with the above.  Armida Sans, MD

## 2023-03-14 ENCOUNTER — Telehealth: Payer: Self-pay

## 2023-03-14 NOTE — Telephone Encounter (Signed)
-----   Message from Armida Sans sent at 03/13/2023  6:13 PM EDT ----- Diagnosis Skin , R post deltoid lat to surgery scar DYSPLASTIC NEVUS WITH MODERATE TO SEVERE ATYPIA WITH SCAR AND PERSISTENT NEVUS-LIKE CHANGES, PERIPHERAL AND DEEP MARGINS INVOLVED, SEE DESCRIPTION  Severe Dysplastic Schedule surgery

## 2023-03-14 NOTE — Telephone Encounter (Signed)
Advised pt of bx results.  Pt was out of town, he will call back next week to schedule surgery appointment./sh

## 2023-03-15 ENCOUNTER — Other Ambulatory Visit: Payer: Self-pay | Admitting: Family Medicine

## 2023-03-17 NOTE — Telephone Encounter (Signed)
ERx 

## 2023-03-19 ENCOUNTER — Encounter: Payer: Self-pay | Admitting: Dermatology

## 2023-03-23 ENCOUNTER — Other Ambulatory Visit: Payer: Self-pay | Admitting: Family Medicine

## 2023-03-23 ENCOUNTER — Other Ambulatory Visit: Payer: Self-pay | Admitting: Cardiovascular Disease

## 2023-03-23 DIAGNOSIS — E1169 Type 2 diabetes mellitus with other specified complication: Secondary | ICD-10-CM

## 2023-03-23 NOTE — Telephone Encounter (Signed)
Rx request sent to pharmacy.  

## 2023-03-26 ENCOUNTER — Other Ambulatory Visit: Payer: Self-pay | Admitting: Cardiovascular Disease
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
## 2023-04-03 ENCOUNTER — Ambulatory Visit: Payer: Medicare HMO | Attending: Cardiovascular Disease | Admitting: Cardiovascular Disease

## 2023-04-03 ENCOUNTER — Encounter: Payer: Self-pay | Admitting: Cardiovascular Disease

## 2023-04-03 VITALS — BP 110/60 | HR 71 | Ht 69.0 in | Wt 238.1 lb

## 2023-04-03 DIAGNOSIS — E1169 Type 2 diabetes mellitus with other specified complication: Secondary | ICD-10-CM | POA: Diagnosis not present

## 2023-04-03 DIAGNOSIS — E118 Type 2 diabetes mellitus with unspecified complications: Secondary | ICD-10-CM | POA: Diagnosis not present

## 2023-04-03 DIAGNOSIS — I25118 Atherosclerotic heart disease of native coronary artery with other forms of angina pectoris: Secondary | ICD-10-CM

## 2023-04-03 DIAGNOSIS — E782 Mixed hyperlipidemia: Secondary | ICD-10-CM | POA: Diagnosis not present

## 2023-04-03 DIAGNOSIS — I4719 Other supraventricular tachycardia: Secondary | ICD-10-CM

## 2023-04-03 DIAGNOSIS — E785 Hyperlipidemia, unspecified: Secondary | ICD-10-CM

## 2023-04-03 DIAGNOSIS — Z7984 Long term (current) use of oral hypoglycemic drugs: Secondary | ICD-10-CM

## 2023-04-03 DIAGNOSIS — I10 Essential (primary) hypertension: Secondary | ICD-10-CM

## 2023-04-03 DIAGNOSIS — R079 Chest pain, unspecified: Secondary | ICD-10-CM | POA: Diagnosis not present

## 2023-04-03 MED ORDER — HYDROCHLOROTHIAZIDE 25 MG PO TABS: 25.0000 mg | ORAL_TABLET | Freq: Every day | ORAL | 3 refills | Status: DC

## 2023-04-03 MED ORDER — ROSUVASTATIN CALCIUM 40 MG PO TABS: 40.0000 mg | ORAL_TABLET | Freq: Every day | ORAL | 3 refills | Status: DC

## 2023-04-03 NOTE — Patient Instructions (Addendum)
Medication Instructions:  Stop aspirin  If you need a refill on your cardiac medications before your next appointment, please call your pharmacy.   Lab work: No new labs needed  Testing/Procedures: No new testing needed  Follow-Up: At [facility], you and your health needs are our priority.  As part of our continuing mission to provide you with exceptional heart care, we have created designated Provider Care Teams.  These Care Teams include your primary Cardiologist (physician) and Advanced Practice Providers (APPs -  Physician Assistants and Nurse Practitioners) who all work together to provide you with the care you need, when you need it.  You will need a follow up appointment in 6 months  Providers on your designated Care Team:   Nicolasa Ducking, NP Eula Listen, PA-C Cadence Fransico [place], [place]  COVID-19 Vaccine Information can be found at: [email or web address] For questions related to vaccine distribution or appointments, please email [email or web address] or call [phone].

## 2023-04-03 NOTE — Telephone Encounter (Signed)
Patient returned call and scheduled surgery. aw

## 2023-04-04 ENCOUNTER — Telehealth: Payer: Self-pay | Admitting: Family Medicine

## 2023-04-04 MED ORDER — SEMAGLUTIDE (1 MG/DOSE) 4 MG/3ML ~~LOC~~ SOPN: 1.0000 mg | PEN_INJECTOR | SUBCUTANEOUS | 3 refills | Status: DC

## 2023-04-04 NOTE — Telephone Encounter (Signed)
Spoke with pt relaying results and Dr Timoteo Expose message. Pt verbalizes understanding and expresses his thanks.

## 2023-04-04 NOTE — Telephone Encounter (Signed)
Pt called stating he has been taking OZEMPIC, 0.25 OR 0.5 MG/DOSE, 2 MG/3ML SOPN for about a yr now & doesn't seem to notice any changes. Pt stated he discuss with his pharmacist about his concerns & the pharmacist suggested Dr. Reece Agar to increase the dosage from 0.5mg  to something higher. Pt asked is there is chance the dosage can be increased? Pt states if it can't, he might want to be taken off the meds altogether due to not seeing any progress. Call back # [phone]

## 2023-04-04 NOTE — Telephone Encounter (Addendum)
Reasonable to try higher ozempic dose 1mg  weekly sent to pharmacy. Watch for worsening GI side effects such as nausea, diarrhea, constipation, upper belly pain if this develops let us know.  Sent to [facility] pharmacy

## 2023-04-04 NOTE — Addendum Note (Signed)
Addended by: Eustaquio Boyden on: 04/04/2023 02:03 PM   Modules accepted: Orders

## 2023-04-11 ENCOUNTER — Encounter: Payer: Self-pay | Admitting: Dermatology

## 2023-04-11 ENCOUNTER — Ambulatory Visit: Payer: Medicare HMO | Admitting: Dermatology

## 2023-04-11 DIAGNOSIS — D2361 Other benign neoplasm of skin of right upper limb, including shoulder: Secondary | ICD-10-CM | POA: Diagnosis not present

## 2023-04-11 DIAGNOSIS — D239 Other benign neoplasm of skin, unspecified: Secondary | ICD-10-CM

## 2023-04-11 MED ORDER — MUPIROCIN 2 % EX OINT
1.0000 | TOPICAL_OINTMENT | Freq: Every day | CUTANEOUS | 0 refills | Status: AC
Start: 2023-04-11 — End: ?

## 2023-04-11 NOTE — Progress Notes (Signed)
   Follow-Up Visit   Subjective  Eric [name] is a 67 y.o. male who presents for the following: Excision of biopsy proven severe dysplastic nevus of right post deltoid  The following portions of the chart were reviewed this encounter and updated as appropriate: medications, allergies, medical history  Review of Systems:  No other skin or systemic complaints except as noted in HPI or Assessment and Plan.  Objective  Well appearing patient in no apparent distress; mood and affect are within normal limits.  A focused examination was performed of the following areas: Right arm  Relevant physical exam findings are noted in the Assessment and Plan.   Right post deltoid Healing biopsy site    Assessment & Plan   Dysplastic nevus Right post deltoid  Skin excision - Right post deltoid  Lesion length (cm):  0.8 Lesion width (cm):  0.6 Margin per side (cm):  0.2 Total excision diameter (cm):  1.2 Informed consent: discussed and consent obtained   Timeout: patient name, date of birth, surgical site, and procedure verified   Procedure prep:  Patient was prepped and draped in usual sterile fashion Prep type:  Isopropyl alcohol and povidone-iodine Anesthesia: the lesion was anesthetized in a standard fashion   Anesthetic:  1% lidocaine w/ epinephrine 1-100,000 buffered w/ 8.4% NaHCO3 Instrument used: #15 blade   Hemostasis achieved with: pressure and electrodesiccation   Outcome: patient tolerated procedure well with no complications   Post-procedure details: sterile dressing applied and wound care instructions given   Dressing type: bandage and pressure dressing (mupirocin)    Skin repair - Right post deltoid Complexity:  Complex Final length (cm):  3 Informed consent: discussed and consent obtained   Timeout: patient name, date of birth, surgical site, and procedure verified   Procedure prep:  Patient was prepped and draped in usual sterile fashion Prep type:   Povidone-iodine (Alcohol) Anesthesia: the lesion was anesthetized in a standard fashion   Anesthetic:  1% lidocaine w/ epinephrine 1-100,000 buffered w/ 8.4% NaHCO3 Reason for type of repair: reduce tension to allow closure, reduce the risk of dehiscence, infection, and necrosis, reduce subcutaneous dead space and avoid a hematoma, allow closure of the large defect, preserve normal anatomy, preserve normal anatomical and functional relationships and enhance both functionality and cosmetic results   Undermining: area extensively undermined   Undermining comment:  Undermining defect 1.0 cm Subcutaneous layers (deep stitches):  Suture size:  2-0 Suture type: Vicryl (polyglactin 910)   Subcutaneous suture technique: Inverted dermal. Fine/surface layer approximation (top stitches):  Suture size:  2-0 Suture type: nylon   Suture type comment:  Nylon Stitches: simple running   Stitches comment:  Nylon Suture removal (days):  7 Hemostasis achieved with: suture and pressure Hemostasis achieved with comment:  Electrocautery Outcome: patient tolerated procedure well with no complications   Post-procedure details: sterile dressing applied and wound care instructions given   Dressing type: pressure dressing and bandage (Mupirocin)    mupirocin ointment (BACTROBAN) 2 % - Right post deltoid Apply 1 Application topically daily. With dressing changes  Specimen 1 - Surgical pathology Differential Diagnosis: Biopsy proven severe dysplastic nevus Check Margins: Yes [id number]     Return in about 1 week (around 04/18/2023) for suture removal.  I, Joanie Coddington, CMA, am acting as scribe for Armida Sans, MD .   Documentation: I have reviewed the above documentation for accuracy and completeness, and I agree with the above.  Armida Sans, MD

## 2023-04-11 NOTE — Patient Instructions (Addendum)
Wound Care Instructions  On the day following your surgery, you should begin doing daily dressing changes: Remove the old dressing and discard it. Cleanse the wound gently with tap water. This may be done in the shower or by placing a wet gauze pad directly on the wound and letting it soak for several minutes. It is important to gently remove any dried blood from the wound in order to encourage healing. This may be done by gently rolling a moistened Q-tip on the dried blood. Do not pick at the wound. If the wound should start to bleed, continue cleaning the wound, then place a moist gauze pad on the wound and hold pressure for a few minutes.  Make sure you then dry the skin surrounding the wound completely or the tape will not stick to the skin. Do not use cotton balls on the wound. After the wound is clean and dry, apply the ointment gently with a Q-tip. Cut a non-stick pad to fit the size of the wound. Lay the pad flush to the wound. If the wound is draining, you may want to reinforce it with a small amount of gauze on top of the non-stick pad for a little added compression to the area. Use the tape to seal the area completely. Select from the following with respect to your individual situation: If your wound has been stitched closed: continue the above steps 1-8 at least daily until your sutures are removed. If your wound has been left open to heal: continue steps 1-8 at least daily for the first 3-4 weeks. We would like for you to take a few extra precautions for at least the next week. Sleep with your head elevated on pillows if our wound is on your head. Do not bend over or lift heavy items to reduce the chance of elevated blood pressure to the wound Do not participate in particularly strenuous activities.   Below is a list of dressing supplies you might need.  Cotton-tipped applicators - Q-tips Gauze pads (2x2 and/or 4x4) - All-Purpose Sponges Non-stick dressing material - Telfa Tape -  Paper or Hypafix New and clean tube of petroleum jelly - Vaseline    Comments on Post-Operative Period Slight swelling and redness often appear around the wound. This is normal and will disappear within several days following the surgery. The healing wound will drain a brownish-red-yellow discharge during healing. This is a normal phase of wound healing. As the wound begins to heal, the drainage may increase in amount. Again, this drainage is normal. Notify us if the drainage becomes persistently bloody, excessively swollen, or intensely painful or develops a foul odor or red streaks.  If you should experience mild discomfort during the healing phase, you may take an aspirin-free medication such as Tylenol (acetaminophen). Notify us if the discomfort is severe or persistent. Avoid alcoholic beverages when taking pain medicine.  In Case of Wound Hemorrhage A wound hemorrhage is when the bandage suddenly becomes soaked with bright red blood and flows profusely. If this happens, sit down or lie down with your head elevated. If the wound has a dressing on it, do not remove the dressing. Apply pressure to the existing gauze. If the wound is not covered, use a gauze pad to apply pressure and continue applying the pressure for 20 minutes without peeking. DO NOT COVER THE WOUND WITH A LARGE TOWEL OR WASH CLOTH. Release your hand from the wound site but do not remove the dressing. If the bleeding has stopped,  gently clean around the wound. Leave the dressing in place for 24 hours if possible. This wait time allows the blood vessels to close off so that you do not spark a new round of bleeding by disrupting the newly clotted blood vessels with an immediate dressing change. If the bleeding does not subside, continue to hold pressure. If matters are out of your control, contact an After Hours clinic or go to the Emergency Room.  Due to recent changes in healthcare laws, you may see results of your pathology and/or  laboratory studies on MyChart before the doctors have had a chance to review them. We understand that in some cases there may be results that are confusing or concerning to you. Please understand that not all results are received at the same time and often the doctors may need to interpret multiple results in order to provide you with the best plan of care or course of treatment. Therefore, we ask that you please give [place] 2 business days to thoroughly review all your results before contacting the office for clarification. Should we see a critical lab result, you will be contacted sooner.   If You Need Anything After Your Visit  If you have any questions or concerns for your doctor, please call our main line at [phone] and press option 4 to reach your doctor's medical assistant. If no one answers, please leave a voicemail as directed and we will return your call as soon as possible. Messages left after 4 pm will be answered the following business day.   You may also send [place] a message via MyChart. We typically respond to MyChart messages within 1-2 business days.  For prescription refills, please ask your pharmacy to contact our office. Our fax number is [phone].  If you have an urgent issue when the clinic is closed that cannot wait until the next business day, you can page your doctor at the number below.    Please note that while we do our best to be available for urgent issues outside of office hours, we are not available 24/7.   If you have an urgent issue and are unable to reach [place], you may choose to seek medical care at your doctor's office, retail clinic, urgent care center, or emergency room.  If you have a medical emergency, please immediately call 911 or go to the emergency department.  Pager Numbers  - Dr. Gwen Pounds: [phone]  - Dr. Roseanne Reno: [phone]  - Dr. Katrinka Blazing: [phone]   In the event of inclement weather, please call our main line at [phone] for an  update on the status of any delays or closures.  Dermatology Medication Tips: Please keep the boxes that topical medications come in in order to help keep track of the instructions about where and how to use these. Pharmacies typically print the medication instructions only on the boxes and not directly on the medication tubes.   If your medication is too expensive, please contact our office at [phone] option 4 or send [place] a message through MyChart.   We are unable to tell what your co-pay for medications will be in advance as this is different depending on your insurance coverage. However, we may be able to find a substitute medication at lower cost or fill out paperwork to get insurance to cover a needed medication.   If a prior authorization is required to get your medication covered by your insurance company, please allow [place] 1-2 business days to complete this process.  Drug prices often vary depending on where the prescription is filled and some pharmacies may offer cheaper prices.  The website www.goodrx.com contains coupons for medications through different pharmacies. The prices here do not account for what the cost may be with help from insurance (it may be cheaper with your insurance), but the website can give you the price if you did not use any insurance.  - You can print the associated coupon and take it with your prescription to the pharmacy.  - You may also stop by our office during regular business hours and pick up a GoodRx coupon card.  - If you need your prescription sent electronically to a different pharmacy, notify our office through [facility] or by phone at [phone] option 4.     Si Usted Necesita Algo Despus de Su Visita  Tambin puede enviarnos un mensaje a travs de Clinical cytogeneticist. Por lo general respondemos a los mensajes de MyChart en el transcurso de 1 a 2 das hbiles.  Para renovar recetas, por favor pida a su farmacia que se ponga en contacto con  nuestra oficina. Annie Sable de fax es [place] [phone].  Si tiene un asunto urgente cuando la clnica est cerrada y que no puede esperar hasta el siguiente da hbil, puede llamar/localizar a su doctor(a) al nmero que aparece a continuacin.   Por favor, tenga en cuenta que aunque hacemos todo lo posible para estar disponibles para asuntos urgentes fuera del horario de [place], no estamos disponibles las 24 horas del da, los 7 [address]  [address] de la [place].   Si tiene un problema urgente y no puede comunicarse con nosotros, puede optar por buscar atencin mdica  en el consultorio de su doctor(a), en una clnica privada, en un centro de atencin urgente o en una sala de emergencias.  Si tiene Engineer, drilling, por favor llame inmediatamente al 911 o vaya a la sala de emergencias.  Nmeros de bper  - Dr. Gwen Pounds: [phone]  - Dra. Roseanne Reno: [phone]  - Dr. Katrinka Blazing: [phone]   En caso de inclemencias del tiempo, por favor llame a Lacy Duverney principal al [phone] para una actualizacin sobre el [place] de cualquier retraso o cierre.  Consejos para la medicacin en dermatologa: Por favor, guarde las cajas en las que vienen los medicamentos de uso tpico para ayudarle a seguir las instrucciones sobre dnde y cmo usarlos. Las farmacias generalmente imprimen las instrucciones del medicamento slo en las cajas y no directamente en los tubos del [place].   Si su medicamento es muy caro, por favor, pngase en contacto con Rolm Gala llamando al [phone] y presione la opcin 4 o envenos un mensaje a travs de Clinical cytogeneticist.   No podemos decirle cul ser su copago por los medicamentos por adelantado ya que esto es diferente dependiendo de la cobertura de su seguro. Sin embargo, es posible que podamos encontrar un medicamento sustituto a Audiological scientist un formulario para que el seguro cubra el medicamento que se considera necesario.   Si se requiere una autorizacin  previa para que su compaa de seguros [place] su medicamento, por favor permtanos de 1 a 2 das hbiles para completar [address]  Los precios de los medicamentos varan con frecuencia dependiendo del Environmental consultant de dnde se surte la receta y alguna farmacias pueden ofrecer precios ms baratos.  El sitio web www.goodrx.com tiene cupones para medicamentos de Health and safety inspector. Los precios aqu no tienen en cuenta lo que podra costar con la ayuda del seguro (puede ser  ms barato con su seguro), pero el sitio web puede darle el precio si no Visual merchandiser.  - Puede imprimir el cupn correspondiente y llevarlo con su receta a la farmacia.  - Tambin puede pasar por nuestra oficina durante el horario de atencin regular y Education officer, museum una tarjeta de cupones de GoodRx.  - Si necesita que su receta se enve electrnicamente a una farmacia diferente, informe a nuestra oficina a travs de MyChart de [place] o por telfono llamando al [phone] y presione la opcin 4.

## 2023-04-12 ENCOUNTER — Telehealth: Payer: Self-pay

## 2023-04-12 NOTE — Telephone Encounter (Signed)
Patient doing well after yesterday's surgery.Eric [name]

## 2023-04-14 LAB — SURGICAL PATHOLOGY

## 2023-04-18 ENCOUNTER — Ambulatory Visit: Payer: Medicare HMO | Admitting: Dermatology

## 2023-04-18 VITALS — BP 109/62

## 2023-04-18 DIAGNOSIS — Z86018 Personal history of other benign neoplasm: Secondary | ICD-10-CM

## 2023-04-18 NOTE — Progress Notes (Unsigned)
   Follow-Up Visit   Subjective  Eric [name] is a 67 y.o. male who presents for the following: Dysplastic nevus margins free, bx proven, R post deltoid, 1 wk f/u, pt presents for suture removal The patient has spots, moles and lesions to be evaluated, some may be new or changing and the patient may have concern these could be cancer.   The following portions of the chart were reviewed this encounter and updated as appropriate: medications, allergies, medical history  Review of Systems:  No other skin or systemic complaints except as noted in HPI or Assessment and Plan.  Objective  Well appearing patient in no apparent distress; mood and affect are within normal limits.   A focused examination was performed of the following areas: R post deltoid  Relevant exam findings are noted in the Assessment and Plan.    Assessment & Plan   SEVERE DYSPLASTIC NEVUS, margins free, bx proven R post deltoid Exam: excision site healing well  Treatment Plan: Encounter for Removal of Sutures - Incision site at the R post deltoid is clean, dry and intact - Wound cleansed, sutures removed, wound cleansed and steri strips applied.  - Discussed pathology results showing Severe dysplastic nevus margins free  - Patient advised to keep steri-strips dry until they fall off. - Scars remodel for a full year. - Once steri-strips fall off, patient can apply over-the-counter silicone scar cream each night to help with scar remodeling if desired. - Patient advised to call with any concerns or if they notice any new or changing lesions.    MELANOCYTIC NEVI Scalp  Exam: Tan-brown and/or pink-flesh-colored symmetric macules and papules  Treatment Plan: Benign appearing on exam today. Recommend observation. Call clinic for new or changing moles. Recommend daily use of broad spectrum spf 30+ sunscreen to sun-exposed areas.     Return for as scheduled for TBSE, Hx of SCC, Hx of Dysplastic nevi.  I,  Ardis Rowan, RMA, am acting as scribe for Armida Sans, MD .   Documentation: I have reviewed the above documentation for accuracy and completeness, and I agree with the above.  Armida Sans, MD

## 2023-04-18 NOTE — Patient Instructions (Signed)

## 2023-04-19 ENCOUNTER — Encounter: Payer: Self-pay | Admitting: Dermatology

## 2023-04-20 ENCOUNTER — Ambulatory Visit: Payer: BC Managed Care – PPO | Admitting: Dermatology

## 2023-04-24 ENCOUNTER — Ambulatory Visit
Admission: RE | Admit: 2023-04-24 | Discharge: 2023-04-24 | Disposition: A | Discharge status: Home / Self Care | Payer: Medicare HMO | Source: Ambulatory Visit | Attending: Acute Care | Admitting: Acute Care

## 2023-04-24 DIAGNOSIS — Z122 Encounter for screening for malignant neoplasm of respiratory organs: Secondary | ICD-10-CM

## 2023-04-24 DIAGNOSIS — Z87891 Personal history of nicotine dependence: Secondary | ICD-10-CM

## 2023-05-05 ENCOUNTER — Ambulatory Visit: Payer: Medicare HMO

## 2023-05-05 VITALS — Ht 69.0 in | Wt 230.0 lb

## 2023-05-05 DIAGNOSIS — Z0000 Encounter for general adult medical examination without abnormal findings: Secondary | ICD-10-CM | POA: Diagnosis not present
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
## 2023-05-05 NOTE — Patient Instructions (Signed)
Mr. Fallen , Thank you for taking time to come for your Medicare Wellness Visit. I appreciate your ongoing commitment to your health goals. Please review the following plan we discussed and let me know if I can assist you in the future.   Referrals/Orders/Follow-Ups/Clinician Recommendations: Aim for 30 minutes of exercise or brisk walking, 6-8 glasses of water, and 5 servings of fruits and vegetables each day.   This is a list of the screening recommended for you and due dates:  Health Maintenance  Topic Date Due   Pneumonia Vaccine (2 of 2 - PCV) 05/23/2022   Flu Shot  02/23/2023   COVID-19 Vaccine (6 - 2023-24 season) 03/26/2023   Hemoglobin A1C  04/06/2023   Complete foot exam   05/25/2023   Eye exam for diabetics  11/08/2023   Yearly kidney function blood test for diabetes  03/02/2024   Yearly kidney health urinalysis for diabetes  03/02/2024   Medicare Annual Wellness Visit  05/04/2024   DTaP/Tdap/Td vaccine (3 - Td or Tdap) 03/18/2025   Colon Cancer Screening  11/16/2027   Hepatitis C Screening  Completed   HIV Screening  Completed   Zoster (Shingles) Vaccine  Completed   HPV Vaccine  Aged Out    Advanced directives: (Provided) Advance directive discussed with you today. I have provided a copy for you to complete at home and have notarized. Once this is complete, please bring a copy in to our office so we can scan it into your chart. Information on Advanced Care Planning can be found at [facility] Advance Health Care Directives Advance Health Care Directives ([email or web address]    Next Medicare Annual Wellness Visit scheduled for next year: Yes  insert Preventive Care attachment  Insert FALL PREVENTION attachment if needed

## 2023-05-11 DIAGNOSIS — H40003 Preglaucoma, unspecified, bilateral: Secondary | ICD-10-CM | POA: Diagnosis not present

## 2023-05-11 DIAGNOSIS — H2513 Age-related nuclear cataract, bilateral: Secondary | ICD-10-CM | POA: Diagnosis not present

## 2023-05-11 DIAGNOSIS — H43813 Vitreous degeneration, bilateral: Secondary | ICD-10-CM | POA: Diagnosis not present

## 2023-05-11 DIAGNOSIS — H18523 Epithelial (juvenile) corneal dystrophy, bilateral: Secondary | ICD-10-CM | POA: Diagnosis not present

## 2023-05-15 ENCOUNTER — Other Ambulatory Visit: Payer: Self-pay | Admitting: Family Medicine

## 2023-05-15 DIAGNOSIS — E1169 Type 2 diabetes mellitus with other specified complication: Secondary | ICD-10-CM

## 2023-05-15 DIAGNOSIS — Z125 Encounter for screening for malignant neoplasm of prostate: Secondary | ICD-10-CM

## 2023-05-19 ENCOUNTER — Other Ambulatory Visit (INDEPENDENT_AMBULATORY_CARE_PROVIDER_SITE_OTHER): Payer: Medicare HMO

## 2023-05-19 ENCOUNTER — Other Ambulatory Visit: Payer: Medicare HMO

## 2023-05-19 DIAGNOSIS — E785 Hyperlipidemia, unspecified: Secondary | ICD-10-CM | POA: Diagnosis not present

## 2023-05-19 DIAGNOSIS — E1169 Type 2 diabetes mellitus with other specified complication: Secondary | ICD-10-CM | POA: Diagnosis not present

## 2023-05-19 DIAGNOSIS — Z125 Encounter for screening for malignant neoplasm of prostate: Secondary | ICD-10-CM

## 2023-05-19 LAB — COMPREHENSIVE METABOLIC PANEL
ALT: 28 U/L (ref 0–53)
AST: 18 U/L (ref 0–37)
Albumin: 4.5 g/dL (ref 3.5–5.2)
Alkaline Phosphatase: 43 U/L (ref 39–117)
BUN: 12 mg/dL (ref 6–23)
CO2: 29 meq/L (ref 19–32)
Calcium: 9.6 mg/dL (ref 8.4–10.5)
Chloride: 101 meq/L (ref 96–112)
Creatinine, Ser: 0.91 mg/dL (ref 0.40–1.50)
GFR: 88.15 mL/min (ref >60.00)
Glucose, Bld: 104 mg/dL — ABNORMAL HIGH (ref 70–99)
Potassium: 4.2 meq/L (ref 3.5–5.1)
Sodium: 137 meq/L (ref 135–145)
Total Bilirubin: 0.6 mg/dL (ref 0.2–1.2)
Total Protein: 7 g/dL (ref 6.0–8.3)

## 2023-05-19 LAB — PSA: PSA: 0.91 ng/mL (ref 0.10–4.00)

## 2023-05-19 LAB — VITAMIN B12: Vitamin B-12: 585 pg/mL (ref 211–911)

## 2023-05-19 LAB — LIPID PANEL
Cholesterol: 85 mg/dL (ref 0–200)
HDL: 36.2 mg/dL — ABNORMAL LOW (ref >39.00)
LDL Cholesterol: 34 mg/dL (ref 0–99)
NonHDL: 48.6
Total CHOL/HDL Ratio: 2
Triglycerides: 75 mg/dL (ref 0.0–149.0)
VLDL: 15 mg/dL (ref 0.0–40.0)

## 2023-05-19 LAB — HEMOGLOBIN A1C: Hgb A1c MFr Bld: 6 % (ref 4.6–6.5)

## 2023-05-19 LAB — MICROALBUMIN / CREATININE URINE RATIO
Creatinine,U: 95.6 mg/dL
Microalb Creat Ratio: 0.7 mg/g (ref 0.0–30.0)
Microalb, Ur: 0.7 mg/dL (ref 0.0–1.9)

## 2023-05-26 ENCOUNTER — Encounter: Payer: Medicare HMO | Admitting: Family Medicine

## 2023-06-05 ENCOUNTER — Encounter: Payer: Self-pay | Admitting: Family Medicine

## 2023-06-05 ENCOUNTER — Ambulatory Visit (INDEPENDENT_AMBULATORY_CARE_PROVIDER_SITE_OTHER)
Admission: RE | Admit: 2023-06-05 | Discharge: 2023-06-05 | Disposition: A | Discharge status: Home / Self Care | Payer: Medicare HMO | Source: Ambulatory Visit | Attending: Family Medicine | Admitting: Family Medicine

## 2023-06-05 ENCOUNTER — Ambulatory Visit (INDEPENDENT_AMBULATORY_CARE_PROVIDER_SITE_OTHER): Payer: Medicare HMO | Admitting: Family Medicine

## 2023-06-05 VITALS — BP 134/80 | HR 71 | Temp 97.8°F | Ht 69.0 in | Wt 237.1 lb

## 2023-06-05 DIAGNOSIS — R0789 Other chest pain: Secondary | ICD-10-CM

## 2023-06-05 DIAGNOSIS — M542 Cervicalgia: Secondary | ICD-10-CM | POA: Diagnosis not present

## 2023-06-05 DIAGNOSIS — I700 Atherosclerosis of aorta: Secondary | ICD-10-CM | POA: Diagnosis not present

## 2023-06-05 DIAGNOSIS — G4700 Insomnia, unspecified: Secondary | ICD-10-CM | POA: Insufficient documentation

## 2023-06-05 DIAGNOSIS — E1169 Type 2 diabetes mellitus with other specified complication: Secondary | ICD-10-CM | POA: Diagnosis not present

## 2023-06-05 MED ORDER — EPINEPHRINE 0.3 MG/0.3ML IJ SOAJ: INTRAMUSCULAR | 1 refills | Status: DC

## 2023-06-05 MED ORDER — PREDNISONE 20 MG PO TABS: ORAL_TABLET | ORAL | 0 refills | Status: DC

## 2023-06-05 NOTE — Patient Instructions (Addendum)
VISIT SUMMARY: [name], during your visit today, we discussed your ongoing left-sided chest and neck pain that began approximately six months ago and has recently worsened. We also reviewed your well-managed diabetes and the recent insect bite on your left shoulder.  YOUR PLAN: -CERVICALGIA: Cervicalgia refers to neck pain. You have severe left-sided neck pain that has worsened recently and radiates to your left shoulder and trapezius. We will start you on a one-week Prednisone taper, and continue Flexeril as needed as well as heating pad, gentle stretching. If there is no improvement with the steroids, return to see Dr. Dallas Schimke in Sports Medicine.  -CHEST WALL PAIN: Chest wall pain is pain that occurs in the chest area, often exacerbated by deep breathing. Your pain is sharp and started after a popping sensation in your left trapezius area. We will order a chest X-ray and order prednisone course, if no better, return to see Dr. Dallas Schimke in Sports Medicine.   -TYPE 2 DIABETES MELLITUS: Type 2 Diabetes Mellitus is a condition where your body does not use insulin properly, leading to high blood sugar levels. Your diabetes is well-controlled with Ozempic and Metformin. While you are on Prednisone, monitor your blood glucose levels closely as the medication can increase blood sugar levels.  INSTRUCTIONS: Please follow up with the chest X-rays as ordered. Continue taking Prednisone as prescribed and use Flexeril as needed for pain. Monitor your blood glucose levels closely while on Prednisone. If there is no improvement in your symptoms, we may refer you to Dr. Dallas Schimke in Sports Medicine for further evaluation.

## 2023-06-05 NOTE — Progress Notes (Unsigned)
Ph: [phone] Fax: [phone]   Patient ID: Eric [name], male    DOB: [birth year], 67 y.o.   MRN: [id number]  This visit was conducted in person.  BP 134/80   Pulse 71   Temp 97.8 F (36.6 C) (Oral)   Ht 5\' 9"  (1.753 m)   Wt 237 lb 2 oz (107.6 kg)   SpO2 94%   BMI 35.02 kg/m    Chief Complaint  Patient presents with   Chest Pain    C/o ongoing L rib pain in chest. Painful to take deep breath. Seen for previously, told possible pulled muscle. Pt now thinks may be cracked rib.   Neck Pain    C/o L side neck pain. Started about 2 days ago. Noticed insect bite last night on posterior L shoulder.     Subjective:   Discussed the use of AI scribe software for clinical note transcription with the patient, who gave verbal consent to proceed.  History of Present Illness   Eric [name], a 67 year old male with a history of diabetes, presents with left-sided chest and neck pain. The pain began approximately six months ago after an incident where he felt a snap in his neck while changing oil for his tractor. He initially thought it was a neck strain and was treated with Flexeril, heating pad, stretching, and home exercises. However, the pain has persisted and has recently worsened over the past few days. The pain is described as sharp and stabbing, particularly noticeable when taking deep breaths. The patient also reports a recent insect bite on his left shoulder, though it is unclear if this is related to his current symptoms. He denies any recent falls or injuries. The patient also mentions a previous neck injury from wrestling 40 years ago. His diabetes is well-managed with Ozempic and Metformin, and he has been making dietary changes to further improve his condition.   He notes flexeril was somewhat helpful, has not recently tried. He denies recent trauma/injury or falls.   He denies shortness of breath, cough or fevers.          Relevant past medical, surgical, family and  social history reviewed and updated as indicated. Interim medical history since our last visit reviewed. Allergies and medications reviewed and updated. Outpatient Medications Prior to Visit  Medication Sig Dispense Refill   Ascorbic Acid (VITAMIN C) 1000 MG tablet Take 1,000 mg by mouth every evening.      cholecalciferol (VITAMIN D3) 25 MCG (1000 UT) tablet Take 1,000 Units by mouth every evening.      ciprofloxacin (CIPRO) 500 MG tablet Take 500 mg by mouth 2 (two) times daily.     clopidogrel (PLAVIX) 75 MG tablet TAKE 1 TABLET BY MOUTH DAILY 90 tablet 3   Coenzyme Q10 200 MG capsule Take 200 mg by mouth daily in the afternoon.      cyclobenzaprine (FLEXERIL) 10 MG tablet Take 1 tablet (10 mg total) by mouth 3 (three) times daily as needed for muscle spasms. 30 tablet 1   diazepam (VALIUM) 5 MG tablet Take 5 mg by mouth as directed.     diphenhydrAMINE (BENADRYL) 25 MG tablet Take 25 mg by mouth every 6 (six) hours as needed for allergies.      docusate sodium (COLACE) 100 MG capsule Take 100 mg by mouth daily.     famotidine (PEPCID) 20 MG tablet TAKE 1 TABLET BY MOUTH TWICE A DAY 180 tablet 3   fexofenadine (ALLEGRA) 180 MG tablet  Take 1 tablet (180 mg total) by mouth daily. 90 tablet 3   hydrochlorothiazide (HYDRODIURIL) 25 MG tablet Take 1 tablet (25 mg total) by mouth daily. 90 tablet 3   hydrocortisone 2.5 % cream Apply topically 3 (three) times a week. Apply to scaly areas on face 3 nights a week, Tuesday, Thursday and Saturday for Seborrheic Dermatits 30 g 11   ipratropium (ATROVENT) 0.06 % nasal spray Place 2 sprays into both nostrils 4 (four) times daily. 15 mL 12   ketoconazole (NIZORAL) 2 % cream Apply 1 Application topically 3 (three) times a week. Apply to scaly areas on face 3 nights weekly, Monday, Wednesday, Friday for seborrheic dermatitis 60 g 11   losartan (COZAAR) 100 MG tablet TAKE 1 TABLET BY MOUTH DAILY 90 tablet 3   metFORMIN (GLUCOPHAGE) 500 MG tablet TAKE THREE  TABLETS BY MOUTH EVERY MORNING WITH BREAKFAST 270 tablet 0   Methylcellulose, Laxative, (CITRUCEL) 500 MG TABS Take 1,000 mg by mouth at bedtime.     Multiple Vitamin (MULTIVITAMIN WITH MINERALS) TABS tablet Take 1 tablet by mouth every evening.     mupirocin ointment (BACTROBAN) 2 % Apply 1 Application topically daily. With dressing changes 22 g 0   nitroGLYCERIN (NITROSTAT) 0.4 MG SL tablet Place 1 tablet (0.4 mg total) under the tongue every 5 (five) minutes x 3 doses as needed for chest pain. PLACE 1 TABLET UNDER THE TONGUE IF NEEDED EVERY 5 MINUTES FOR CHEST PAIN FOR 3 DOSES IF NO RELIEF AFER FIRST DOSE CALL PRESCRIBER OR 911 25 tablet 3   Omega-3 Fatty Acids (FISH OIL CONCENTRATE) 1000 MG CAPS Take 3,000 mg by mouth every evening.      omeprazole (PRILOSEC) 40 MG capsule Take 40 mg by mouth daily.     ondansetron (ZOFRAN-ODT) 4 MG disintegrating tablet Take 1 tablet (4 mg total) by mouth every 8 (eight) hours as needed for nausea or vomiting. 20 tablet 0   ondansetron (ZOFRAN-ODT) 4 MG disintegrating tablet Take 1 tablet (4 mg total) by mouth every 8 (eight) hours as needed for nausea or vomiting. 20 tablet 0   pantoprazole (PROTONIX) 40 MG tablet Take 1 tablet (40 mg total) by mouth daily. 90 tablet 3   promethazine-dextromethorphan (PROMETHAZINE-DM) 6.25-15 MG/5ML syrup Take 5 mLs by mouth 4 (four) times daily as needed. 118 mL 0   propranolol (INDERAL) 20 MG tablet Take 1 tablet (20 mg total) by mouth 3 (three) times daily as needed (tachycardia). 30 tablet 3   rosuvastatin (CRESTOR) 40 MG tablet Take 1 tablet (40 mg total) by mouth daily. 90 tablet 3   Semaglutide, 1 MG/DOSE, 4 MG/3ML SOPN Inject 1 mg as directed once a week. 3 mL 3   sildenafil (VIAGRA) 100 MG tablet Take 0.5-1 tablets (50-100 mg total) by mouth daily as needed for erectile dysfunction. 20 tablet 3   traMADol (ULTRAM) 50 MG tablet Take 50 mg by mouth every 6 (six) hours as needed.     traZODone (DESYREL) 50 MG tablet Take  0.5-1 tablets (25-50 mg total) by mouth at bedtime as needed for sleep. 90 tablet 1   triamcinolone cream (KENALOG) 0.1 % Apply 1 Application topically 2 (two) times daily. Apply to AA. 45 g 0   TURMERIC PO Take 1,000 mg by mouth in the morning and at bedtime.     EPINEPHrine 0.3 mg/0.3 mL IJ SOAJ injection INJECT IN THE MUSCLE AS DIRECTED 4 each 0   No facility-administered medications prior to visit.  Per HPI unless specifically indicated in ROS section below Review of Systems  Objective:  BP 134/80   Pulse 71   Temp 97.8 F (36.6 C) (Oral)   Ht 5\' 9"  (1.753 m)   Wt 237 lb 2 oz (107.6 kg)   SpO2 94%   BMI 35.02 kg/m   Wt Readings from Last 3 Encounters:  06/05/23 237 lb 2 oz (107.6 kg)  05/05/23 230 lb (104.3 kg)  04/03/23 238 lb 2 oz (108 kg)    Physical Exam    Results   LABS A1c: 6.0 (05/19/2023)  DIAGNOSTIC Stress test: Normal (01/2023)        Assessment & Plan:      Cervicalgia vs cervical muscle strain Severe left-sided neck pain, worsened over the past few days, with radiation to the left shoulder and trapezius. No associated extremity numbness, tingling, or weakness. No recent trauma or injury. -Start Prednisone taper for one week. Routine steroid precautions reviewed - he has tolerated this well previously. -Continue Flexeril as needed. -Consider referral to Dr. Dallas Schimke (Sports Medicine) if no improvement with steroids.  Left sided Chest Wall Pain Left-sided chest wall pain, described as sharp and exacerbated by deep breathing. No tenderness on palpation. History of a "snap" sensation in the left trapezius area while performing physical activity about five months ago. Suspicious for possible pectoralis muscle tear/strain.  -Order chest X-ray. -Rx prednisone course as per above -Consider referral to Dr. Dallas Schimke (Sports Medicine) if no improvement with steroids.  Type 2 Diabetes Mellitus Well-controlled with Ozempic 1mg  and Metformin 500mg  three  times daily. A1c was 6.0 three weeks ago. -Monitor blood glucose levels closely while on Prednisone due to potential hyperglycemic effects.      Problem List Items Addressed This Visit   None Visit Diagnoses     Left-sided chest wall pain    -  Primary   Relevant Orders   DG Chest 2 View        Meds ordered this encounter  Medications   predniSONE (DELTASONE) 20 MG tablet    Sig: Take two tablets daily for 3 days followed by one tablet daily for 4 days    Dispense:  10 tablet    Refill:  0   EPINEPHrine 0.3 mg/0.3 mL IJ SOAJ injection    Sig: INJECT IN THE MUSCLE AS DIRECTED    Dispense:  2 each    Refill:  1    Orders Placed This Encounter  Procedures   DG Chest 2 View    Standing Status:   Future    Number of Occurrences:   1    Standing Expiration Date:   06/04/2024    Order Specific Question:   Reason for Exam (SYMPTOM  OR DIAGNOSIS REQUIRED)    Answer:   left chest wall pain x6 months    Order Specific Question:   Preferred imaging location?    Answer:   [facility]    Patient Instructions  VISIT SUMMARY: Eric [name], during your visit today, we discussed your ongoing left-sided chest and neck pain that began approximately six months ago and has recently worsened. We also reviewed your well-managed diabetes and the recent insect bite on your left shoulder.  YOUR PLAN: -CERVICALGIA: Cervicalgia refers to neck pain. You have severe left-sided neck pain that has worsened recently and radiates to your left shoulder and trapezius. We will start you on a one-week Prednisone taper, and continue Flexeril as needed as well as heating pad, gentle stretching.  If there is no improvement with the steroids, return to see Dr. Dallas Schimke in Sports Medicine.  -CHEST WALL PAIN: Chest wall pain is pain that occurs in the chest area, often exacerbated by deep breathing. Your pain is sharp and started after a popping sensation in your left trapezius area. We will order a chest  X-ray and order prednisone course, if no better, return to see Dr. Dallas Schimke in Sports Medicine.   -TYPE 2 DIABETES MELLITUS: Type 2 Diabetes Mellitus is a condition where your body does not use insulin properly, leading to high blood sugar levels. Your diabetes is well-controlled with Ozempic and Metformin. While you are on Prednisone, monitor your blood glucose levels closely as the medication can increase blood sugar levels.  INSTRUCTIONS: Please follow up with the chest X-rays as ordered. Continue taking Prednisone as prescribed and use Flexeril as needed for pain. Monitor your blood glucose levels closely while on Prednisone. If there is no improvement in your symptoms, we may refer you to Dr. Dallas Schimke in Sports Medicine for further evaluation.  Follow up plan: No follow-ups on file.  Eustaquio Boyden, MD

## 2023-06-06 DIAGNOSIS — R0789 Other chest pain: Secondary | ICD-10-CM | POA: Insufficient documentation

## 2023-06-06 NOTE — Assessment & Plan Note (Signed)
Ongoing, present for months.  Rx prednisone taper, continue flexeril PRN.  Update if not better with this, consider PT referral.

## 2023-06-06 NOTE — Assessment & Plan Note (Signed)
Great control to date.  Reviewed steroid precautions in diabetic.

## 2023-06-06 NOTE — Assessment & Plan Note (Signed)
Component of costochondritis but also with concern for pectoralis strain/tear. Continue Flexeril, supportive care as needed, follow-up with Dr. Patsy Lager sports medicine if not improving as expected.

## 2023-06-09 ENCOUNTER — Ambulatory Visit: Payer: Medicare HMO | Admitting: Family Medicine

## 2023-06-09 ENCOUNTER — Encounter: Payer: Self-pay | Admitting: Family Medicine

## 2023-06-09 VITALS — BP 134/80 | HR 75 | Temp 97.6°F | Ht 68.25 in | Wt 237.1 lb

## 2023-06-09 DIAGNOSIS — K219 Gastro-esophageal reflux disease without esophagitis: Secondary | ICD-10-CM | POA: Diagnosis not present

## 2023-06-09 DIAGNOSIS — Z0000 Encounter for general adult medical examination without abnormal findings: Secondary | ICD-10-CM | POA: Diagnosis not present

## 2023-06-09 DIAGNOSIS — E1169 Type 2 diabetes mellitus with other specified complication: Secondary | ICD-10-CM

## 2023-06-09 DIAGNOSIS — Z7189 Other specified counseling: Secondary | ICD-10-CM | POA: Diagnosis not present

## 2023-06-09 DIAGNOSIS — I10 Essential (primary) hypertension: Secondary | ICD-10-CM | POA: Diagnosis not present

## 2023-06-09 DIAGNOSIS — Z7985 Long-term (current) use of injectable non-insulin antidiabetic drugs: Secondary | ICD-10-CM

## 2023-06-09 DIAGNOSIS — E785 Hyperlipidemia, unspecified: Secondary | ICD-10-CM | POA: Diagnosis not present

## 2023-06-09 DIAGNOSIS — I2510 Atherosclerotic heart disease of native coronary artery without angina pectoris: Secondary | ICD-10-CM | POA: Diagnosis not present

## 2023-06-09 DIAGNOSIS — C44520 Squamous cell carcinoma of anal skin: Secondary | ICD-10-CM | POA: Diagnosis not present

## 2023-06-09 DIAGNOSIS — M542 Cervicalgia: Secondary | ICD-10-CM

## 2023-06-09 DIAGNOSIS — Z87891 Personal history of nicotine dependence: Secondary | ICD-10-CM

## 2023-06-09 DIAGNOSIS — K760 Fatty (change of) liver, not elsewhere classified: Secondary | ICD-10-CM | POA: Diagnosis not present

## 2023-06-09 DIAGNOSIS — R0789 Other chest pain: Secondary | ICD-10-CM

## 2023-06-09 NOTE — Assessment & Plan Note (Signed)
LFTs remain normal.

## 2023-06-09 NOTE — Assessment & Plan Note (Signed)
Chronic, stable. Continue current regimen. 

## 2023-06-09 NOTE — Assessment & Plan Note (Signed)
Remains abstinent. Aged out of lung cancer screen as quit over 15 yrs ago.

## 2023-06-09 NOTE — Assessment & Plan Note (Addendum)
Continues pantoprazole and pepcid with benefit.

## 2023-06-09 NOTE — Assessment & Plan Note (Signed)
Prednisone has helped. He will let me know if ongoing trouble for PT referral.

## 2023-06-09 NOTE — Assessment & Plan Note (Signed)
Advanced directive - does not have this. GF Randolm Idol (in [place]) to be HCPOA. Full code, but would not want prolonged life support if terminal condition. Packet provided today.

## 2023-06-09 NOTE — Assessment & Plan Note (Signed)
Preventative protocols reviewed and updated unless pt declined. Discussed healthy diet and lifestyle.  

## 2023-06-09 NOTE — Assessment & Plan Note (Addendum)
Chronic, well controlled. Tolerating ozempic and metformin well. He is considering trial off ozempic come 07/2023 - see above.  He declines Rx for glucometer.

## 2023-06-09 NOTE — Assessment & Plan Note (Signed)
Continue to encourage healthy diet and lifestyle choices to affect sustainable weight loss. He continues ozempic 1mg  weekly without significant noted effect - considering stopping 07/2023 and reassessing glycemic control and weight.

## 2023-06-09 NOTE — Assessment & Plan Note (Signed)
Chronic, stable on current regimen - continue. The ASCVD Risk score (Arnett DK, et al., 2019) failed to calculate for the following reasons:   The valid total cholesterol range is 130 to 320 mg/dL

## 2023-06-09 NOTE — Assessment & Plan Note (Signed)
Appreciate cardiology care.  °

## 2023-06-09 NOTE — Patient Instructions (Addendum)
Advanced directive packet provided today  Keep me updated with how you're feeling (neck, chest wall) Good to see you today  Return as needed or in 6 months for diabetes follow up visit

## 2023-06-09 NOTE — Progress Notes (Signed)
Ph: [phone] Fax: [phone]   Patient ID: Eric [name], male    DOB: 1955-10-15, 67 y.o.   MRN: [id number]  This visit was conducted in person.  BP 134/80   Pulse 75   Temp 97.6 F (36.4 C) (Oral)   Ht 5' 8.25" (1.734 m)   Wt 237 lb 2 oz (107.6 kg)   SpO2 94%   BMI 35.79 kg/m    CC: CPE Subjective:   HPI: Eric [name] is a 67 y.o. male presenting on 06/09/2023 for Annual Exam (MCR prt 2 [AWV- 05/05/23].)   Saw health advisor 04/2023 for medicare wellness visit. Note reviewed.   No results found.  Flowsheet Row Clinical Support from 05/05/2023 in [facility]  PHQ-2 Total Score 0          05/05/2023    3:27 PM 10/04/2022   12:10 PM  Fall Risk   Falls in the past year? 0 0  Number falls in past yr: 0   Injury with Fall? 0   Risk for fall due to : No Fall Risks   Follow up Falls prevention discussed    See prior note for details - L cervicalgia/strain as well as L sided chest wall pain /costochondritis/ pec muscle strain - seen earlier this week, treated with prednisone taper + flexeril PRN. To see sports med if not better with above. CXR read still pending.   S\p R RTC repair and biceps tenodesis 09/2021 followed by L RTC repair 03/2022 [address] Allena Katz).   DM - continues metformin 500mg  TID with Ozempic 1mg  weekly. He doesn't notice much change in appetite despite ozempic. He has started very low carb diet and has felt better. Considering stopping GLP1RA. Lab Results  Component Value Date   HGBA1C 6.0 05/19/2023    Preventative: Colonoscopy 10/2022 - 1 TA, diverticulosis, int hem, rpt 5-7 yrs [facility])  Prostate cancer screening - yearly PSA Lung cancer screening - latest 03/2022. Aged out  Flu shot yearly  COVID vaccine Moderna 08/2019, 09/2019, booster 06/2020, Dec 22, 2020, 05/2021, 06/2022, 04/2023 Pneumovax 03/10/2014 [facility])  Tetanus 2007, Tdap 2016 RSV 07/2022 zostavax 07/2013  shingrix - 07/2017, 10/2017   Advanced directive - does not have this. GF Randolm Idol (in [place]) to be HCPOA. Full code, but would not want prolonged life support if terminal condition. Packet provided today.  Seat belt use discussed  Sunscreen use discussed, no changing moles on skin. Sees derm yearly Ex smoker - quit 03/2007. Smoked 1ppd for 20 yrs. ~20 PY hx Alcohol - occ alcohol  Dentist Q6 mo  Eye exam Q6 mo - monitoring glaucoma   Lives alone - brother died of suicide Dec 23, 2019  Occupation: truck Hospital doctor, stocks stores  Activity: started walking  Diet: good water, fruits/vegetables some - healthier eating choices     Relevant past medical, surgical, family and social history reviewed and updated as indicated. Interim medical history since our last visit reviewed. Allergies and medications reviewed and updated. Outpatient Medications Prior to Visit  Medication Sig Dispense Refill   Ascorbic Acid (VITAMIN C) 1000 MG tablet Take 1,000 mg by mouth every evening.      cholecalciferol (VITAMIN D3) 25 MCG (1000 UT) tablet Take 1,000 Units by mouth every evening.      ciprofloxacin (CIPRO) 500 MG tablet Take 500 mg by mouth 2 (two) times daily.     clopidogrel (PLAVIX) 75 MG tablet TAKE 1 TABLET BY MOUTH DAILY 90 tablet 3  Coenzyme Q10 200 MG capsule Take 200 mg by mouth daily in the afternoon.      cyclobenzaprine (FLEXERIL) 10 MG tablet Take 1 tablet (10 mg total) by mouth 3 (three) times daily as needed for muscle spasms. 30 tablet 1   diazepam (VALIUM) 5 MG tablet Take 5 mg by mouth as directed.     diphenhydrAMINE (BENADRYL) 25 MG tablet Take 25 mg by mouth every 6 (six) hours as needed for allergies.      docusate sodium (COLACE) 100 MG capsule Take 100 mg by mouth daily.     EPINEPHrine 0.3 mg/0.3 mL IJ SOAJ injection INJECT IN THE MUSCLE AS DIRECTED 2 each 1   famotidine (PEPCID) 20 MG tablet TAKE 1 TABLET BY MOUTH TWICE A DAY 180 tablet 3   fexofenadine (ALLEGRA) 180 MG tablet Take 1 tablet (180 mg total) by  mouth daily. 90 tablet 3   hydrochlorothiazide (HYDRODIURIL) 25 MG tablet Take 1 tablet (25 mg total) by mouth daily. 90 tablet 3   hydrocortisone 2.5 % cream Apply topically 3 (three) times a week. Apply to scaly areas on face 3 nights a week, Tuesday, Thursday and Saturday for Seborrheic Dermatits 30 g 11   ipratropium (ATROVENT) 0.06 % nasal spray Place 2 sprays into both nostrils 4 (four) times daily. 15 mL 12   ketoconazole (NIZORAL) 2 % cream Apply 1 Application topically 3 (three) times a week. Apply to scaly areas on face 3 nights weekly, Monday, Wednesday, Friday for seborrheic dermatitis 60 g 11   losartan (COZAAR) 100 MG tablet TAKE 1 TABLET BY MOUTH DAILY 90 tablet 3   metFORMIN (GLUCOPHAGE) 500 MG tablet TAKE THREE TABLETS BY MOUTH EVERY MORNING WITH BREAKFAST 270 tablet 0   Methylcellulose, Laxative, (CITRUCEL) 500 MG TABS Take 1,000 mg by mouth at bedtime.     Multiple Vitamin (MULTIVITAMIN WITH MINERALS) TABS tablet Take 1 tablet by mouth every evening.     mupirocin ointment (BACTROBAN) 2 % Apply 1 Application topically daily. With dressing changes 22 g 0   nitroGLYCERIN (NITROSTAT) 0.4 MG SL tablet Place 1 tablet (0.4 mg total) under the tongue every 5 (five) minutes x 3 doses as needed for chest pain. PLACE 1 TABLET UNDER THE TONGUE IF NEEDED EVERY 5 MINUTES FOR CHEST PAIN FOR 3 DOSES IF NO RELIEF AFER FIRST DOSE CALL PRESCRIBER OR 911 25 tablet 3   Omega-3 Fatty Acids (FISH OIL CONCENTRATE) 1000 MG CAPS Take 3,000 mg by mouth every evening.      omeprazole (PRILOSEC) 40 MG capsule Take 40 mg by mouth daily.     ondansetron (ZOFRAN-ODT) 4 MG disintegrating tablet Take 1 tablet (4 mg total) by mouth every 8 (eight) hours as needed for nausea or vomiting. 20 tablet 0   ondansetron (ZOFRAN-ODT) 4 MG disintegrating tablet Take 1 tablet (4 mg total) by mouth every 8 (eight) hours as needed for nausea or vomiting. 20 tablet 0   pantoprazole (PROTONIX) 40 MG tablet Take 1 tablet (40 mg  total) by mouth daily. 90 tablet 3   predniSONE (DELTASONE) 20 MG tablet Take two tablets daily for 3 days followed by one tablet daily for 4 days 10 tablet 0   promethazine-dextromethorphan (PROMETHAZINE-DM) 6.25-15 MG/5ML syrup Take 5 mLs by mouth 4 (four) times daily as needed. 118 mL 0   propranolol (INDERAL) 20 MG tablet Take 1 tablet (20 mg total) by mouth 3 (three) times daily as needed (tachycardia). 30 tablet 3   rosuvastatin (CRESTOR) 40 MG  tablet Take 1 tablet (40 mg total) by mouth daily. 90 tablet 3   Semaglutide, 1 MG/DOSE, 4 MG/3ML SOPN Inject 1 mg as directed once a week. 3 mL 3   sildenafil (VIAGRA) 100 MG tablet Take 0.5-1 tablets (50-100 mg total) by mouth daily as needed for erectile dysfunction. 20 tablet 3   traMADol (ULTRAM) 50 MG tablet Take 50 mg by mouth every 6 (six) hours as needed.     traZODone (DESYREL) 50 MG tablet Take 0.5-1 tablets (25-50 mg total) by mouth at bedtime as needed for sleep. 90 tablet 1   triamcinolone cream (KENALOG) 0.1 % Apply 1 Application topically 2 (two) times daily. Apply to AA. 45 g 0   TURMERIC PO Take 1,000 mg by mouth in the morning and at bedtime.     No facility-administered medications prior to visit.     Per HPI unless specifically indicated in ROS section below Review of Systems  Constitutional:  Negative for activity change, appetite change, chills, fatigue, fever and unexpected weight change.  HENT:  Negative for hearing loss.   Eyes:  Negative for visual disturbance.  Respiratory:  Negative for cough, chest tightness, shortness of breath and wheezing.   Cardiovascular:  Positive for chest pain (L-sided as per last OV). Negative for palpitations and leg swelling.  Gastrointestinal:  Positive for diarrhea (occ loose stools). Negative for abdominal distention, abdominal pain, blood in stool, constipation, nausea and vomiting.  Genitourinary:  Negative for difficulty urinating and hematuria.  Musculoskeletal:  Positive for neck  pain. Negative for arthralgias and myalgias.  Skin:  Negative for rash.  Neurological:  Negative for dizziness, seizures, syncope and headaches.  Hematological:  Negative for adenopathy. Does not bruise/bleed easily.  Psychiatric/Behavioral:  Negative for dysphoric mood. The patient is not nervous/anxious.     Objective:  BP 134/80   Pulse 75   Temp 97.6 F (36.4 C) (Oral)   Ht 5' 8.25" (1.734 m)   Wt 237 lb 2 oz (107.6 kg)   SpO2 94%   BMI 35.79 kg/m   Wt Readings from Last 3 Encounters:  06/09/23 237 lb 2 oz (107.6 kg)  06/05/23 237 lb 2 oz (107.6 kg)  05/05/23 230 lb (104.3 kg)      Physical Exam Vitals and nursing note reviewed.  Constitutional:      General: He is not in acute distress.    Appearance: Normal appearance. He is well-developed. He is not ill-appearing.  HENT:     Head: Normocephalic and atraumatic.     Right Ear: Hearing, tympanic membrane, ear canal and external ear normal.     Left Ear: Hearing, tympanic membrane, ear canal and external ear normal.     Mouth/Throat:     Mouth: Mucous membranes are moist.     Pharynx: Oropharynx is clear. No oropharyngeal exudate or posterior oropharyngeal erythema.  Eyes:     General: No scleral icterus.    Extraocular Movements: Extraocular movements intact.     Conjunctiva/sclera: Conjunctivae normal.     Pupils: Pupils are equal, round, and reactive to light.  Neck:     Thyroid: No thyroid mass or thyromegaly.  Cardiovascular:     Rate and Rhythm: Normal rate and regular rhythm.     Pulses: Normal pulses.          Radial pulses are 2+ on the right side and 2+ on the left side.     Heart sounds: Normal heart sounds. No murmur heard. Pulmonary:  Effort: Pulmonary effort is normal. No respiratory distress.     Breath sounds: Normal breath sounds. No wheezing, rhonchi or rales.  Abdominal:     General: Bowel sounds are normal. There is no distension.     Palpations: Abdomen is soft. There is no mass.      Tenderness: There is no abdominal tenderness. There is no guarding or rebound.     Hernia: No hernia is present.  Musculoskeletal:        General: Normal range of motion.     Cervical back: Normal range of motion and neck supple.     Right lower leg: No edema.     Left lower leg: No edema.  Lymphadenopathy:     Cervical: No cervical adenopathy.  Skin:    General: Skin is warm and dry.     Findings: No rash.  Neurological:     General: No focal deficit present.     Mental Status: He is alert and oriented to person, place, and time.  Psychiatric:        Mood and Affect: Mood normal.        Behavior: Behavior normal.        Thought Content: Thought content normal.        Judgment: Judgment normal.       Results for orders placed or performed in visit on 05/19/23  Vitamin B12  Result Value Ref Range   Vitamin B-12 585 211 - 911 pg/mL  PSA  Result Value Ref Range   PSA 0.91 0.10 - 4.00 ng/mL  Microalbumin / creatinine urine ratio  Result Value Ref Range   Microalb, Ur 0.7 0.0 - 1.9 mg/dL   Creatinine,U 38.7 mg/dL   Microalb Creat Ratio 0.7 0.0 - 30.0 mg/g  Hemoglobin A1c  Result Value Ref Range   Hgb A1c MFr Bld 6.0 4.6 - 6.5 %  Comprehensive metabolic panel  Result Value Ref Range   Sodium 137 135 - 145 mEq/L   Potassium 4.2 3.5 - 5.1 mEq/L   Chloride 101 96 - 112 mEq/L   CO2 29 19 - 32 mEq/L   Glucose, Bld 104 (H) 70 - 99 mg/dL   BUN 12 6 - 23 mg/dL   Creatinine, Ser 5.64 0.40 - 1.50 mg/dL   Total Bilirubin 0.6 0.2 - 1.2 mg/dL   Alkaline Phosphatase 43 39 - 117 U/L   AST 18 0 - 37 U/L   ALT 28 0 - 53 U/L   Total Protein 7.0 6.0 - 8.3 g/dL   Albumin 4.5 3.5 - 5.2 g/dL   GFR 33.29 >51.88 mL/min   Calcium 9.6 8.4 - 10.5 mg/dL  Lipid panel  Result Value Ref Range   Cholesterol 85 0 - 200 mg/dL   Triglycerides 41.6 0.0 - 149.0 mg/dL   HDL 60.63 (L) >01.60 mg/dL   VLDL 10.9 0.0 - 32.3 mg/dL   LDL Cholesterol 34 0 - 99 mg/dL   Total CHOL/HDL Ratio 2    NonHDL  48.60     Assessment & Plan:   Problem List Items Addressed This Visit     Health maintenance examination - Primary (Chronic)    Preventative protocols reviewed and updated unless pt declined. Discussed healthy diet and lifestyle.       Advanced directives, counseling/discussion (Chronic)    Advanced directive - does not have this. GF Randolm Idol (in [place]) to be HCPOA. Full code, but would not want prolonged life support if terminal condition. Packet provided today.  Hyperlipidemia associated with type 2 diabetes mellitus (HCC)    Chronic, stable on current regimen - continue. The ASCVD Risk score (Arnett DK, et al., 2019) failed to calculate for the following reasons:   The valid total cholesterol range is 130 to 320 mg/dL       Severe obesity (BMI 35.0-39.9) with comorbidity (HCC)    Continue to encourage healthy diet and lifestyle choices to affect sustainable weight loss. He continues ozempic 1mg  weekly without significant noted effect - considering stopping 07/2023 and reassessing glycemic control and weight.       HYPERTENSION, BENIGN    Chronic, stable. Continue current regimen.       CAD (coronary artery disease)    Appreciate cardiology care.       Type 2 diabetes mellitus with other specified complication (HCC)    Chronic, well controlled. Tolerating ozempic and metformin well. He is considering trial off ozempic come 07/2023 - see above.  He declines Rx for glucometer.      Squamous cell carcinoma of anal skin    Continue derm f/u yearly       Ex-smoker    Remains abstinent. Aged out of lung cancer screen as quit over 15 yrs ago.      Fatty liver    LFTs remain normal.       GERD (gastroesophageal reflux disease)    Continues pantoprazole and pepcid with benefit.       Neck pain on left side    Prednisone has helped. He will let me know if ongoing trouble for PT referral.       Left-sided chest wall pain    Prednisone has helped. He will  let me know if ongoing trouble for PT referral.         No orders of the defined types were placed in this encounter.   No orders of the defined types were placed in this encounter.   Patient Instructions  Advanced directive packet provided today  Keep me updated with how you're feeling (neck, chest wall) Good to see you today  Return as needed or in 6 months for diabetes follow up visit   Follow up plan: Return in about 6 months (around 12/07/2023) for follow up visit.  Eustaquio Boyden, MD

## 2023-06-09 NOTE — Assessment & Plan Note (Signed)
Continue derm f/u yearly

## 2023-06-20 ENCOUNTER — Other Ambulatory Visit: Payer: Self-pay | Admitting: Family Medicine

## 2023-06-20 DIAGNOSIS — E1169 Type 2 diabetes mellitus with other specified complication: Secondary | ICD-10-CM

## 2023-07-02 ENCOUNTER — Ambulatory Visit
Admission: EM | Admit: 2023-07-02 | Discharge: 2023-07-02 | Disposition: A | Discharge status: Home / Self Care | Payer: Medicare HMO | Attending: Physician Assistant | Admitting: Physician Assistant

## 2023-07-02 ENCOUNTER — Encounter: Payer: Self-pay | Admitting: Emergency Medicine

## 2023-07-02 DIAGNOSIS — R051 Acute cough: Secondary | ICD-10-CM | POA: Diagnosis not present

## 2023-07-02 DIAGNOSIS — J069 Acute upper respiratory infection, unspecified: Secondary | ICD-10-CM | POA: Insufficient documentation

## 2023-07-02 DIAGNOSIS — J029 Acute pharyngitis, unspecified: Secondary | ICD-10-CM | POA: Insufficient documentation

## 2023-07-02 LAB — SARS CORONAVIRUS 2 BY RT PCR: SARS Coronavirus 2 by RT PCR: NEGATIVE

## 2023-07-02 LAB — GROUP A STREP BY PCR: Group A Strep by PCR: NOT DETECTED

## 2023-07-02 MED ORDER — IPRATROPIUM BROMIDE 0.06 % NA SOLN: 2.0000 | Freq: Four times a day (QID) | NASAL | 0 refills | Status: DC

## 2023-07-02 MED ORDER — LIDOCAINE VISCOUS HCL 2 % MT SOLN
15.0000 mL | OROMUCOSAL | 0 refills | Status: DC | PRN
Start: 2023-07-02 — End: 2023-12-15

## 2023-07-02 MED ORDER — PROMETHAZINE-DM 6.25-15 MG/5ML PO SYRP: 5.0000 mL | ORAL_SOLUTION | Freq: Four times a day (QID) | ORAL | 0 refills | Status: DC | PRN
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
## 2023-07-02 NOTE — ED Triage Notes (Signed)
Pt c/o cough, hoarseness, and sore throat. Started about 2 days ago. Denies fever.

## 2023-07-02 NOTE — Discharge Instructions (Addendum)
-  Negative COVID and strep.  URI/COLD SYMPTOMS: Your exam today is consistent with a viral illness. Antibiotics are not indicated at this time. Use medications as directed, including cough syrup, nasal saline, and decongestants. Your symptoms should improve over the next few days and resolve within 7-10 days. Increase rest and fluids. F/u if symptoms worsen or predominate such as sore throat, ear pain, productive cough, shortness of breath, or if you develop high fevers or worsening fatigue over the next several days.   

## 2023-07-07 ENCOUNTER — Ambulatory Visit
Admission: EM | Admit: 2023-07-07 | Discharge: 2023-07-07 | Disposition: A | Discharge status: Home / Self Care | Payer: Medicare HMO | Attending: Family Medicine | Admitting: Family Medicine

## 2023-07-07 DIAGNOSIS — J439 Emphysema, unspecified: Secondary | ICD-10-CM

## 2023-07-07 DIAGNOSIS — Z87891 Personal history of nicotine dependence: Secondary | ICD-10-CM

## 2023-07-07 DIAGNOSIS — R051 Acute cough: Secondary | ICD-10-CM

## 2023-07-07 MED ORDER — PREDNISONE 50 MG PO TABS: 50.0000 mg | ORAL_TABLET | Freq: Every day | ORAL | 0 refills | Status: AC

## 2023-07-07 MED ORDER — PROMETHAZINE-DM 6.25-15 MG/5ML PO SYRP: 5.0000 mL | ORAL_SOLUTION | Freq: Four times a day (QID) | ORAL | 0 refills | Status: DC | PRN

## 2023-07-07 MED ORDER — AZITHROMYCIN 250 MG PO TABS: ORAL_TABLET | ORAL | 0 refills | Status: DC

## 2023-07-07 NOTE — ED Triage Notes (Signed)
Pt c/o continued symptoms from 07/02/23.   Pt states that he has had cough and chest congestion for a week  Pt asks for an antibiotic for his symptoms.  Pt was told that his symptoms would go away on its own but it has not gone away yet.

## 2023-07-07 NOTE — Discharge Instructions (Addendum)
Ask your primary care provider, about lung function tests to determine if you truly have COPD or emphysema.    Stop by the pharmacy to pick up your prescriptions.
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
## 2023-07-08 ENCOUNTER — Ambulatory Visit: Payer: Self-pay

## 2023-07-13 DIAGNOSIS — B9689 Other specified bacterial agents as the cause of diseases classified elsewhere: Secondary | ICD-10-CM | POA: Diagnosis not present

## 2023-07-13 DIAGNOSIS — J329 Chronic sinusitis, unspecified: Secondary | ICD-10-CM | POA: Diagnosis not present

## 2023-07-13 DIAGNOSIS — R911 Solitary pulmonary nodule: Secondary | ICD-10-CM | POA: Diagnosis not present

## 2023-07-13 DIAGNOSIS — R051 Acute cough: Secondary | ICD-10-CM | POA: Diagnosis not present

## 2023-07-16 ENCOUNTER — Other Ambulatory Visit: Payer: Self-pay | Admitting: Cardiovascular Disease

## 2023-07-17 NOTE — Telephone Encounter (Signed)
Good Morning,  Please advise if ok to refill medication or defer to PCP. Thank you so much.

## 2023-07-24 ENCOUNTER — Other Ambulatory Visit: Payer: Self-pay | Admitting: Family Medicine

## 2023-07-24 DIAGNOSIS — E1169 Type 2 diabetes mellitus with other specified complication: Secondary | ICD-10-CM

## 2023-07-24 NOTE — Telephone Encounter (Signed)
Ozempic Last filled:  06/28/23, #3 mL Last OV:  06/09/23, CPE Next OV:  12/08/23, 6 mo DM f/u

## 2023-07-27 NOTE — Telephone Encounter (Signed)
 ERx

## 2023-08-02 ENCOUNTER — Other Ambulatory Visit: Payer: Self-pay | Admitting: Family Medicine

## 2023-08-02 DIAGNOSIS — F4320 Adjustment disorder, unspecified: Secondary | ICD-10-CM

## 2023-10-04 ENCOUNTER — Other Ambulatory Visit: Payer: Self-pay | Admitting: Cardiovascular Disease

## 2023-10-20 ENCOUNTER — Other Ambulatory Visit: Payer: Self-pay | Admitting: Cardiovascular Disease

## 2023-10-25 ENCOUNTER — Telehealth: Payer: Self-pay

## 2023-10-25 DIAGNOSIS — K219 Gastro-esophageal reflux disease without esophagitis: Secondary | ICD-10-CM

## 2023-10-25 MED ORDER — PANTOPRAZOLE SODIUM 40 MG PO TBEC: 40.0000 mg | DELAYED_RELEASE_TABLET | Freq: Every day | ORAL | 2 refills | Status: DC

## 2023-10-25 NOTE — Telephone Encounter (Signed)
 E-scribed refill

## 2023-11-10 ENCOUNTER — Ambulatory Visit: Attending: Physician Assistant | Admitting: Physician Assistant

## 2023-11-10 ENCOUNTER — Encounter: Payer: Self-pay | Admitting: Physician Assistant

## 2023-11-10 VITALS — BP 130/72 | HR 75 | Ht 69.0 in | Wt 256.4 lb

## 2023-11-10 DIAGNOSIS — I10 Essential (primary) hypertension: Secondary | ICD-10-CM | POA: Diagnosis not present

## 2023-11-10 DIAGNOSIS — I4719 Other supraventricular tachycardia: Secondary | ICD-10-CM

## 2023-11-10 DIAGNOSIS — E1169 Type 2 diabetes mellitus with other specified complication: Secondary | ICD-10-CM

## 2023-11-10 DIAGNOSIS — I25118 Atherosclerotic heart disease of native coronary artery with other forms of angina pectoris: Secondary | ICD-10-CM

## 2023-11-10 DIAGNOSIS — R0789 Other chest pain: Secondary | ICD-10-CM

## 2023-11-10 DIAGNOSIS — E118 Type 2 diabetes mellitus with unspecified complications: Secondary | ICD-10-CM

## 2023-11-10 DIAGNOSIS — E785 Hyperlipidemia, unspecified: Secondary | ICD-10-CM | POA: Diagnosis not present
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
## 2023-11-10 NOTE — Patient Instructions (Signed)
 Medication Instructions:  Your Physician recommend you continue on your current medication as directed.    *If you need a refill on your cardiac medications before your next appointment, please call your pharmacy*  Lab Work: None ordered at this time   Follow-Up: At [facility], you and your health needs are our priority.  As part of our continuing mission to provide you with exceptional heart care, our providers are all part of one team.  This team includes your primary Cardiologist (physician) and Advanced Practice Providers or APPs (Physician Assistants and Nurse Practitioners) who all work together to provide you with the care you need, when you need it.  Your next appointment:   6 month(s)  Provider:   Timothy Gollan, MD

## 2023-11-13 DIAGNOSIS — H40003 Preglaucoma, unspecified, bilateral: Secondary | ICD-10-CM | POA: Diagnosis not present

## 2023-11-20 DIAGNOSIS — E119 Type 2 diabetes mellitus without complications: Secondary | ICD-10-CM | POA: Diagnosis not present

## 2023-11-20 DIAGNOSIS — H40003 Preglaucoma, unspecified, bilateral: Secondary | ICD-10-CM | POA: Diagnosis not present

## 2023-11-20 DIAGNOSIS — H2513 Age-related nuclear cataract, bilateral: Secondary | ICD-10-CM | POA: Diagnosis not present

## 2023-11-20 LAB — HM DIABETES EYE EXAM

## 2023-11-26 ENCOUNTER — Other Ambulatory Visit: Payer: Self-pay | Admitting: Family Medicine

## 2023-11-26 DIAGNOSIS — E1169 Type 2 diabetes mellitus with other specified complication: Secondary | ICD-10-CM

## 2023-12-01 ENCOUNTER — Other Ambulatory Visit: Payer: Medicare HMO

## 2023-12-08 ENCOUNTER — Other Ambulatory Visit

## 2023-12-08 ENCOUNTER — Ambulatory Visit: Payer: Medicare HMO | Admitting: Family Medicine

## 2023-12-08 DIAGNOSIS — E1169 Type 2 diabetes mellitus with other specified complication: Secondary | ICD-10-CM

## 2023-12-08 LAB — HEMOGLOBIN A1C: Hgb A1c MFr Bld: 7.3 % — ABNORMAL HIGH (ref 4.6–6.5)

## 2023-12-08 LAB — BASIC METABOLIC PANEL WITH GFR
BUN: 15 mg/dL (ref 6–23)
CO2: 28 meq/L (ref 19–32)
Calcium: 9.6 mg/dL (ref 8.4–10.5)
Chloride: 101 meq/L (ref 96–112)
Creatinine, Ser: 0.88 mg/dL (ref 0.40–1.50)
GFR: 89.58 mL/min (ref >60.00)
Glucose, Bld: 129 mg/dL — ABNORMAL HIGH (ref 70–99)
Potassium: 4 meq/L (ref 3.5–5.1)
Sodium: 137 meq/L (ref 135–145)

## 2023-12-12 ENCOUNTER — Ambulatory Visit: Payer: Self-pay | Admitting: Family Medicine

## 2023-12-15 ENCOUNTER — Ambulatory Visit (INDEPENDENT_AMBULATORY_CARE_PROVIDER_SITE_OTHER): Payer: Medicare HMO | Admitting: Family Medicine

## 2023-12-15 ENCOUNTER — Encounter: Payer: Self-pay | Admitting: Family Medicine

## 2023-12-15 VITALS — BP 142/70 | HR 73 | Temp 97.6°F | Ht 69.0 in | Wt 255.0 lb

## 2023-12-15 DIAGNOSIS — E1169 Type 2 diabetes mellitus with other specified complication: Secondary | ICD-10-CM | POA: Diagnosis not present

## 2023-12-15 DIAGNOSIS — R197 Diarrhea, unspecified: Secondary | ICD-10-CM

## 2023-12-15 DIAGNOSIS — R10A1 Flank pain, right side: Secondary | ICD-10-CM | POA: Insufficient documentation

## 2023-12-15 DIAGNOSIS — Z7985 Long-term (current) use of injectable non-insulin antidiabetic drugs: Secondary | ICD-10-CM | POA: Diagnosis not present

## 2023-12-15 DIAGNOSIS — R109 Unspecified abdominal pain: Secondary | ICD-10-CM | POA: Diagnosis not present

## 2023-12-15 LAB — POC URINALSYSI DIPSTICK (AUTOMATED)
Bilirubin, UA: NEGATIVE
Blood, UA: NEGATIVE
Glucose, UA: NEGATIVE
Ketones, UA: NEGATIVE
Leukocytes, UA: NEGATIVE
Nitrite, UA: NEGATIVE
Protein, UA: NEGATIVE
Spec Grav, UA: 1.01 (ref 1.010–1.025)
Urobilinogen, UA: 0.2 U/dL
pH, UA: 8 (ref 5.0–8.0)

## 2023-12-15 MED ORDER — METFORMIN HCL 500 MG PO TABS: 500.0000 mg | ORAL_TABLET | Freq: Two times a day (BID) | ORAL | 3 refills | Status: DC

## 2023-12-15 MED ORDER — HYDROCODONE-ACETAMINOPHEN 5-325 MG PO TABS: 1.0000 | ORAL_TABLET | Freq: Three times a day (TID) | ORAL | 0 refills | Status: DC | PRN

## 2023-12-15 MED ORDER — OZEMPIC (0.25 OR 0.5 MG/DOSE) 2 MG/3ML ~~LOC~~ SOPN: 0.5000 mg | PEN_INJECTOR | SUBCUTANEOUS | 0 refills | Status: DC

## 2023-12-15 NOTE — Patient Instructions (Addendum)
 Start ozempic  0.5mg  weekly for 1 month then increase back to 1mg  weekly.  Drop metformin  to 500mg  twice daily.  You likely had a rib strain after sneezing. Heating pad now, try flexeril  muscle relaxant. I have also refilled pain medicine hydrocodone .  Pass by lab to pick up stool studies.  Good to see you today Return as needed or in 3 months for diabetes follow up visit.

## 2023-12-15 NOTE — Progress Notes (Signed)
 Ph: [phone] Fax: [phone]   Patient ID: Eric [name], male    DOB: [birth year], 68 y.o.   MRN: [id number]  This visit was conducted in person.  BP (!) 142/70   Pulse 73   Temp 97.6 F (36.4 C) (Oral)   Ht 5\' 9"  (1.753 m)   Wt 255 lb (115.7 kg)   SpO2 98%   BMI 37.66 kg/m    CC: 6 mo DM f/u visit  Subjective:   HPI: Eric [name] is a 68 y.o. male presenting on 12/15/2023 for Medical Management of Chronic Issues (Here for 6 mo DM f/u. C/o R flank pain. Started 1 wk ago. Also, c/o diarrhea for past month. )   Planning trip next week to [place]  to visit GF.   R flank pain x 4-5 days - treated at home with some left over pain pills. Denies inciting trauma/injury or falls. Pain described as sharp pain, worse with certain movements. He notes it may have started after several forceful sneezes  Notes diarrhea for the past 4-6 weeks - loose to watery, 3-4x/day. Has only had 2 solid bowel movements in the past 6 weeks. Stopped colace without benefit. Also regularly takes fiber supplement. No fevers/chills, abd pain, nausea/vomiting, blood or mucous in stool or pale or increased buoyancy. No recent abx use - last azithromycin  for sinusitis 06/2023. No recent diverticulitis flare.   DM - does not regularly check sugars. Compliant with antihyperglycemic regimen which includes: metformin  1500mg  daily, ozempic  1mg  weekly. Stopped ozempic  for several months - due to cost, but agrees to restart. Denies low sugars or hypoglycemic symptoms. Denies paresthesias, blurry vision. Last diabetic eye exam 10/2022. Glucometer brand: doesn't have one. Last foot exam: DUE. DSME: declines. Lab Results  Component Value Date   HGBA1C 7.3 (H) 12/08/2023   Diabetic Foot Exam - Simple   Simple Foot Form Diabetic Foot exam was performed with the following findings: Yes 12/15/2023  2:04 PM  Visual Inspection No deformities, no ulcerations, no other skin breakdown bilaterally: Yes Sensation  Testing Intact to touch and monofilament testing bilaterally: Yes Pulse Check Posterior Tibialis and Dorsalis pulse intact bilaterally: Yes Comments No claudication    Lab Results  Component Value Date   MICROALBUR 0.7 05/19/2023        Relevant past medical, surgical, family and social history reviewed and updated as indicated. Interim medical history since our last visit reviewed. Allergies and medications reviewed and updated. Outpatient Medications Prior to Visit  Medication Sig Dispense Refill   Ascorbic Acid (VITAMIN C) 1000 MG tablet Take 1,000 mg by mouth every evening.      cholecalciferol (VITAMIN D3) 25 MCG (1000 UT) tablet Take 1,000 Units by mouth every evening.      ciprofloxacin  (CIPRO ) 500 MG tablet Take 500 mg by mouth 2 (two) times daily.     clopidogrel  (PLAVIX ) 75 MG tablet TAKE 1 TABLET BY MOUTH DAILY 90 tablet 3   Coenzyme Q10 200 MG capsule Take 200 mg by mouth daily in the afternoon.      cyclobenzaprine  (FLEXERIL ) 10 MG tablet Take 1 tablet (10 mg total) by mouth 3 (three) times daily as needed for muscle spasms. 30 tablet 1   diphenhydrAMINE (BENADRYL) 25 MG tablet Take 25 mg by mouth every 6 (six) hours as needed for allergies.      docusate sodium (COLACE) 100 MG capsule Take 100 mg by mouth daily.     EPINEPHrine  0.3 mg/0.3 mL IJ SOAJ injection INJECT  IN THE MUSCLE AS DIRECTED 2 each 1   famotidine  (PEPCID ) 20 MG tablet TAKE 1 TABLET BY MOUTH TWICE A DAY 180 tablet 3   fexofenadine  (ALLEGRA ) 180 MG tablet TAKE 1 TABLET BY MOUTH DAILY 90 tablet 3   hydrochlorothiazide  (HYDRODIURIL ) 25 MG tablet Take 1 tablet (25 mg total) by mouth daily. 90 tablet 3   hydrocortisone  2.5 % cream Apply topically 3 (three) times a week. Apply to scaly areas on face 3 nights a week, Tuesday, Thursday and Saturday for Seborrheic Dermatits 30 g 11   ipratropium (ATROVENT ) 0.06 % nasal spray Place 2 sprays into both nostrils 4 (four) times daily. 15 mL 0   ketoconazole  (NIZORAL ) 2  % cream Apply 1 Application topically 3 (three) times a week. Apply to scaly areas on face 3 nights weekly, Monday, Wednesday, Friday for seborrheic dermatitis 60 g 11   losartan  (COZAAR ) 100 MG tablet TAKE 1 TABLET BY MOUTH DAILY 90 tablet 3   Methylcellulose, Laxative, (CITRUCEL) 500 MG TABS Take 1,000 mg by mouth at bedtime.     Multiple Vitamin (MULTIVITAMIN WITH MINERALS) TABS tablet Take 1 tablet by mouth every evening.     mupirocin  ointment (BACTROBAN ) 2 % Apply 1 Application topically daily. With dressing changes 22 g 0   nitroGLYCERIN  (NITROSTAT ) 0.4 MG SL tablet Place 1 tablet (0.4 mg total) under the tongue every 5 (five) minutes x 3 doses as needed for chest pain. PLACE 1 TABLET UNDER THE TONGUE IF NEEDED EVERY 5 MINUTES FOR CHEST PAIN FOR 3 DOSES IF NO RELIEF AFER FIRST DOSE CALL PRESCRIBER OR 911 25 tablet 0   omeprazole  (PRILOSEC) 40 MG capsule Take 40 mg by mouth daily.     ondansetron  (ZOFRAN -ODT) 4 MG disintegrating tablet Take 1 tablet (4 mg total) by mouth every 8 (eight) hours as needed for nausea or vomiting. 20 tablet 0   ondansetron  (ZOFRAN -ODT) 4 MG disintegrating tablet Take 1 tablet (4 mg total) by mouth every 8 (eight) hours as needed for nausea or vomiting. 20 tablet 0   pantoprazole  (PROTONIX ) 40 MG tablet Take 1 tablet (40 mg total) by mouth daily. 90 tablet 2   propranolol  (INDERAL ) 20 MG tablet Take 1 tablet (20 mg total) by mouth 3 (three) times daily as needed (tachycardia). 30 tablet 3   rosuvastatin  (CRESTOR ) 40 MG tablet Take 1 tablet (40 mg total) by mouth daily. 90 tablet 3   sildenafil  (VIAGRA ) 100 MG tablet Take 0.5-1 tablets (50-100 mg total) by mouth daily as needed for erectile dysfunction. 20 tablet 3   traZODone  (DESYREL ) 50 MG tablet TAKE 1/2 TO 1 TABLET BY MOUTH BY MOUTH AT BEDTIME AS NEEDED FOR SLEEP 90 tablet 3   triamcinolone  cream (KENALOG ) 0.1 % Apply 1 Application topically 2 (two) times daily. Apply to AA. 45 g 0   TURMERIC PO Take 1,000 mg by  mouth in the morning and at bedtime.     metFORMIN  (GLUCOPHAGE ) 500 MG tablet TAKE THREE TABLETS BY MOUTH EVERY MORNING WITH BREAKFAST 270 tablet 1   traMADol  (ULTRAM ) 50 MG tablet Take 50 mg by mouth every 6 (six) hours as needed.     Semaglutide , 1 MG/DOSE, (OZEMPIC , 1 MG/DOSE,) 4 MG/3ML SOPN DIAL AND INJECT UNDER THE SKIN 1 MG WEEKLY (Patient not taking: Reported on 12/15/2023) 3 mL 5   azithromycin  (ZITHROMAX  Z-PAK) 250 MG tablet Take 2 tablets on day 1 then 1 tablet daily 6 tablet 0   diazepam  (VALIUM ) 5 MG tablet Take 5  mg by mouth as directed.     lidocaine  (XYLOCAINE ) 2 % solution Use as directed 15 mLs in the mouth or throat every 3 (three) hours as needed (swish and spit). 100 mL 0   Omega-3 Fatty Acids (FISH OIL CONCENTRATE) 1000 MG CAPS Take 3,000 mg by mouth every evening.      promethazine -dextromethorphan (PROMETHAZINE -DM) 6.25-15 MG/5ML syrup Take 5 mLs by mouth 4 (four) times daily as needed. 118 mL 0   No facility-administered medications prior to visit.     Per HPI unless specifically indicated in ROS section below Review of Systems  Objective:  BP (!) 142/70   Pulse 73   Temp 97.6 F (36.4 C) (Oral)   Ht 5\' 9"  (1.753 m)   Wt 255 lb (115.7 kg)   SpO2 98%   BMI 37.66 kg/m   Wt Readings from Last 3 Encounters:  12/15/23 255 lb (115.7 kg)  11/10/23 256 lb 6.4 oz (116.3 kg)  07/07/23 235 lb (106.6 kg)      Physical Exam Vitals and nursing note reviewed.  Constitutional:      Appearance: Normal appearance. He is not ill-appearing.  HENT:     Mouth/Throat:     Mouth: Mucous membranes are moist.     Pharynx: Oropharynx is clear. No oropharyngeal exudate or posterior oropharyngeal erythema.  Eyes:     Extraocular Movements: Extraocular movements intact.     Conjunctiva/sclera: Conjunctivae normal.     Pupils: Pupils are equal, round, and reactive to light.  Cardiovascular:     Rate and Rhythm: Normal rate and regular rhythm.     Pulses: Normal pulses.      Heart sounds: Normal heart sounds. No murmur heard. Pulmonary:     Effort: Pulmonary effort is normal. No respiratory distress.     Breath sounds: Normal breath sounds. No wheezing, rhonchi or rales.  Musculoskeletal:     Right lower leg: No edema.     Left lower leg: No edema.     Comments: See HPI for foot exam if done  Skin:    General: Skin is warm and dry.     Findings: No rash.  Neurological:     Mental Status: He is alert.  Psychiatric:        Mood and Affect: Mood normal.        Behavior: Behavior normal.       Results for orders placed or performed in visit on 12/15/23  POCT Urinalysis Dipstick (Automated)   Collection Time: 12/15/23 12:22 PM  Result Value Ref Range   Color, UA yellow    Clarity, UA clear    Glucose, UA Negative Negative   Bilirubin, UA negative    Ketones, UA negative    Spec Grav, UA 1.010 1.010 - 1.025   Blood, UA negative    pH, UA 8.0 5.0 - 8.0   Protein, UA Negative Negative   Urobilinogen, UA 0.2 0.2 or 1.0 E.U./dL   Nitrite, UA negative    Leukocytes, UA Negative Negative   Lab Results  Component Value Date   NA 137 12/08/2023   CL 101 12/08/2023   K 4.0 12/08/2023   CO2 28 12/08/2023   BUN 15 12/08/2023   CREATININE 0.88 12/08/2023   GFR 89.58 12/08/2023   CALCIUM  9.6 12/08/2023   PHOS 3.2 03/06/2020   ALBUMIN 4.5 05/19/2023   GLUCOSE 129 (H) 12/08/2023    Lab Results  Component Value Date   HGBA1C 7.3 (H) 12/08/2023  Assessment & Plan:   Problem List Items Addressed This Visit     Type 2 diabetes mellitus with other specified complication (HCC) - Primary   Chronic, deteriorated in setting of stopping Ozempic  several months ago.  Will restart at 0.5mg  weekly x 1 month then increase back to 1mg  weekly. Given above concern for diarrhea, will drop metformin  from 1500mg  daily to 500mg  bid.  Continues to decline referral to diabetes education.  RTC 3 mo DM f/u visit       Relevant Medications   metFORMIN  (GLUCOPHAGE )  500 MG tablet   Semaglutide ,0.25 or 0.5MG /DOS, (OZEMPIC , 0.25 OR 0.5 MG/DOSE,) 2 MG/3ML SOPN   Diarrhea   Ongoing for over a month - describes frequent loose to watery stools, but only a few times a day when it happens.  Last antibiotic course was ~06/2023.  No recent travel. Will drop metformin  dosing from 1500mg  daily to 500mg  bid.  Given duration of loose stools will send GI pathogen panel r/o infectious cauase.       Relevant Orders   C. difficile GDH and Toxin A/B   Gastrointestinal Pathogen Pnl RT, PCR   Right flank pain   Started after forceful sneezing.  UA normal and symptoms not consistent with kidney stone.  Anticipate rhomboid vs lat dorsi strain. Supportive measures reviewed including heating pad, gentle stretching, flexeril  muscle relaxant, topical NSAID. Rx hydrocodone  sparing use per pt request. States tramadol  was ineffective.  Avoid oral NSAIDs in plavix  use.  [place] CSRS reviewed.       Relevant Orders   POCT Urinalysis Dipstick (Automated) (Completed)     Meds ordered this encounter  Medications   metFORMIN  (GLUCOPHAGE ) 500 MG tablet    Sig: Take 1 tablet (500 mg total) by mouth 2 (two) times daily with a meal.    Dispense:  180 tablet    Refill:  3    Note new does   Semaglutide ,0.25 or 0.5MG /DOS, (OZEMPIC , 0.25 OR 0.5 MG/DOSE,) 2 MG/3ML SOPN    Sig: Inject 0.5 mg into the skin once a week. For the first month then increase to 1mg  weekly    Dispense:  3 mL    Refill:  0   HYDROcodone -acetaminophen  (NORCO/VICODIN) 5-325 MG tablet    Sig: Take 1 tablet by mouth 3 (three) times daily as needed for moderate pain (pain score 4-6).    Dispense:  15 tablet    Refill:  0    Orders Placed This Encounter  Procedures   C. difficile GDH and Toxin A/B    Standing Status:   Future    Expiration Date:   12/14/2024   Gastrointestinal Pathogen Pnl RT, PCR    Standing Status:   Future    Expiration Date:   12/14/2024   POCT Urinalysis Dipstick (Automated)    Patient  Instructions  Start ozempic  0.5mg  weekly for 1 month then increase back to 1mg  weekly.  Drop metformin  to 500mg  twice daily.  You likely had a rib strain after sneezing. Heating pad now, try flexeril  muscle relaxant. I have also refilled pain medicine hydrocodone .  Pass by lab to pick up stool studies.  Good to see you today Return as needed or in 3 months for diabetes follow up visit.   Follow up plan: Return in about 3 months (around 03/16/2024) for follow up visit.  Claire Crick, MD

## 2023-12-16 NOTE — Assessment & Plan Note (Addendum)
 Chronic, deteriorated in setting of stopping Ozempic  several months ago.  Will restart at 0.5mg  weekly x 1 month then increase back to 1mg  weekly. Given above concern for diarrhea, will drop metformin  from 1500mg  daily to 500mg  bid.  Continues to decline referral to diabetes education.  RTC 3 mo DM f/u visit

## 2023-12-16 NOTE — Assessment & Plan Note (Signed)
 Ongoing for over a month - describes frequent loose to watery stools, but only a few times a day when it happens.  Last antibiotic course was ~06/2023.  No recent travel. Will drop metformin  dosing from 1500mg  daily to 500mg  bid.  Given duration of loose stools will send GI pathogen panel r/o infectious cauase.

## 2023-12-16 NOTE — Assessment & Plan Note (Addendum)
 Started after forceful sneezing.  UA normal and symptoms not consistent with kidney stone.  Anticipate rhomboid vs lat dorsi strain. Supportive measures reviewed including heating pad, gentle stretching, flexeril  muscle relaxant, topical NSAID. Rx hydrocodone  sparing use per pt request. States tramadol  was ineffective.  Avoid oral NSAIDs in plavix  use.  [place] CSRS reviewed.

## 2023-12-18 ENCOUNTER — Ambulatory Visit
Admission: EM | Admit: 2023-12-18 | Discharge: 2023-12-18 | Disposition: A | Discharge status: Home / Self Care | Attending: Emergency Medicine | Admitting: Emergency Medicine

## 2023-12-18 DIAGNOSIS — S39012A Strain of muscle, fascia and tendon of lower back, initial encounter: Secondary | ICD-10-CM | POA: Diagnosis not present

## 2023-12-18 DIAGNOSIS — S29019A Strain of muscle and tendon of unspecified wall of thorax, initial encounter: Secondary | ICD-10-CM

## 2023-12-18 MED ORDER — DEXAMETHASONE SODIUM PHOSPHATE 10 MG/ML IJ SOLN
10.0000 mg | Freq: Once | INTRAMUSCULAR | Status: AC
  Administered 2023-12-18: 10 mg via INTRAMUSCULAR

## 2023-12-18 MED ORDER — BACLOFEN 10 MG PO TABS: 10.0000 mg | ORAL_TABLET | Freq: Three times a day (TID) | ORAL | 0 refills | Status: DC

## 2023-12-18 MED ORDER — PREDNISONE 10 MG (21) PO TBPK: ORAL_TABLET | ORAL | 0 refills | Status: DC
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
## 2023-12-18 NOTE — Discharge Instructions (Addendum)
 Take the prednisone  according to the package instructions.  He will take it each morning with breakfast for the next 6 days.  Take the baclofen, 10 mg every 8 hours, on a schedule for the next 48 hours and then as needed.  Apply moist heat to your back for 30 minutes at a time 2-3 times a day to improve blood flow to the area and help remove the lactic acid causing the spasm.  Follow the back exercises given at discharge.  Return for reevaluation for any new or worsening symptoms.

## 2023-12-18 NOTE — ED Triage Notes (Addendum)
 Patient states that he sneezed really hard and felt back pain on the right side of his back. Goes down his right leg sometimes. No urinary sx. Patient seen PC and was given norco. Patient states that he hasn't picked up the medication   12/15/23 patient seen PCP

## 2023-12-19 ENCOUNTER — Other Ambulatory Visit: Payer: Self-pay

## 2023-12-19 ENCOUNTER — Telehealth: Payer: Self-pay | Admitting: Family Medicine

## 2023-12-19 DIAGNOSIS — R197 Diarrhea, unspecified: Secondary | ICD-10-CM

## 2023-12-19 NOTE — Telephone Encounter (Signed)
 Called to speak to pt. He said they are a weight loss supplement with Apple Cider Vinegar, he thinks. He is starting back on Ozempic . I looked it up on [employer].

## 2023-12-19 NOTE — Telephone Encounter (Signed)
 Pt came in and would like to know if its ok if he can take   Lipo Gummies his sister brought him

## 2023-12-20 ENCOUNTER — Ambulatory Visit: Payer: Self-pay | Admitting: Family Medicine

## 2023-12-20 LAB — C. DIFFICILE GDH AND TOXIN A/B
GDH ANTIGEN: NOT DETECTED
MICRO NUMBER:: 16502425
SPECIMEN QUALITY:: ADEQUATE
TOXIN A AND B: NOT DETECTED

## 2023-12-21 NOTE — Telephone Encounter (Signed)
Spoke with pt relaying Dr. G's message. Pt verbalizes understanding.  

## 2023-12-21 NOTE — Telephone Encounter (Addendum)
 Reviewed ingredients of LipoGummies online - B6, B12, folate, iodine, apple cider vinegar complex and beetroot and pomegranate with 4gms of added sugars.  Ok to try as long as not too expensive, ensure doesn't raise sugars when he takes it.

## 2023-12-22 LAB — GASTROINTESTINAL PATHOGEN PNL
CampyloBacter Group: NOT DETECTED
Norovirus GI/GII: NOT DETECTED
Rotavirus A: NOT DETECTED
Salmonella species: NOT DETECTED
Shiga Toxin 1: NOT DETECTED
Shiga Toxin 2: NOT DETECTED
Shigella Species: NOT DETECTED
Vibrio Group: NOT DETECTED
Yersinia enterocolitica: NOT DETECTED

## 2023-12-29 ENCOUNTER — Encounter: Payer: Self-pay | Admitting: Pharmacist

## 2023-12-29 NOTE — Progress Notes (Signed)
 Pharmacy Quality Measure Review  This patient is appearing on a report for being at risk of failing the adherence measure for diabetes medications this calendar year.   Medication: METFORMIN  500 MG Last fill date: 10/23/2023 for 90 day supply  N/A. Next refill due 01/21/24.  Likely flagged on insurance report due to no fill history for Jan and Feb aside from 15 ds.   New Rx sent 12/15/23 x90 days. 3 additional refills remaining.

## 2024-01-10 ENCOUNTER — Other Ambulatory Visit: Payer: Self-pay | Admitting: Family Medicine

## 2024-01-10 NOTE — Telephone Encounter (Signed)
 Pt to increase to 1 mg wkly after 1 mo of 0.5 mg dose (see 12/15/23, OV notes). Rx sent 07/27/23, #3 ml/5 refills to [facility].   Request denied.

## 2024-01-11 ENCOUNTER — Other Ambulatory Visit (HOSPITAL_COMMUNITY): Payer: Self-pay

## 2024-01-15 ENCOUNTER — Ambulatory Visit: Admitting: Cardiovascular Disease

## 2024-01-20 ENCOUNTER — Other Ambulatory Visit: Payer: Self-pay | Admitting: Family Medicine

## 2024-01-20 DIAGNOSIS — E1169 Type 2 diabetes mellitus with other specified complication: Secondary | ICD-10-CM

## 2024-01-22 DIAGNOSIS — H00014 Hordeolum externum left upper eyelid: Secondary | ICD-10-CM | POA: Diagnosis not present

## 2024-01-30 ENCOUNTER — Ambulatory Visit: Payer: Self-pay

## 2024-01-30 NOTE — Telephone Encounter (Signed)
 Copied from CRM [id number]. Topic: Clinical - Prescription Issue >> Jan 30, 2024 11:02 AM Donna BRAVO wrote: Reason for CRM: patient calling in asking why medication refill metFORMIN  (GLUCOPHAGE ) 500 MG tablet pharmacy texted patient stating medication denied by prescriber. Patient would like a call back regarding this issue. Patient phone  [phone]

## 2024-01-30 NOTE — Telephone Encounter (Signed)
 Per chart, rx sent 12/15/23, #180/3 refills to [facility].   Spoke with pt relaying info above. Suggested pt contact pharmacy asking them to fill most recent rx. Pt verbalizes understanding.

## 2024-03-19 DIAGNOSIS — Z20822 Contact with and (suspected) exposure to covid-19: Secondary | ICD-10-CM | POA: Diagnosis not present

## 2024-03-19 DIAGNOSIS — J069 Acute upper respiratory infection, unspecified: Secondary | ICD-10-CM | POA: Diagnosis not present

## 2024-03-25 ENCOUNTER — Other Ambulatory Visit: Payer: Self-pay

## 2024-03-25 ENCOUNTER — Emergency Department

## 2024-03-25 ENCOUNTER — Emergency Department
Admission: EM | Admit: 2024-03-25 | Discharge: 2024-03-25 | Disposition: A | Discharge status: Home / Self Care | Attending: Emergency Medicine | Admitting: Emergency Medicine

## 2024-03-25 DIAGNOSIS — Y92003 Bedroom of unspecified non-institutional (private) residence as the place of occurrence of the external cause: Secondary | ICD-10-CM | POA: Insufficient documentation

## 2024-03-25 DIAGNOSIS — I2510 Atherosclerotic heart disease of native coronary artery without angina pectoris: Secondary | ICD-10-CM | POA: Insufficient documentation

## 2024-03-25 DIAGNOSIS — I6529 Occlusion and stenosis of unspecified carotid artery: Secondary | ICD-10-CM | POA: Diagnosis not present

## 2024-03-25 DIAGNOSIS — S0990XA Unspecified injury of head, initial encounter: Secondary | ICD-10-CM | POA: Insufficient documentation

## 2024-03-25 DIAGNOSIS — S161XXA Strain of muscle, fascia and tendon at neck level, initial encounter: Secondary | ICD-10-CM | POA: Insufficient documentation

## 2024-03-25 DIAGNOSIS — E119 Type 2 diabetes mellitus without complications: Secondary | ICD-10-CM | POA: Diagnosis not present

## 2024-03-25 DIAGNOSIS — I672 Cerebral atherosclerosis: Secondary | ICD-10-CM | POA: Diagnosis not present

## 2024-03-25 DIAGNOSIS — W06XXXA Fall from bed, initial encounter: Secondary | ICD-10-CM | POA: Insufficient documentation

## 2024-03-25 DIAGNOSIS — M50322 Other cervical disc degeneration at C5-C6 level: Secondary | ICD-10-CM | POA: Diagnosis not present

## 2024-03-25 DIAGNOSIS — Z7902 Long term (current) use of antithrombotics/antiplatelets: Secondary | ICD-10-CM | POA: Insufficient documentation

## 2024-03-25 DIAGNOSIS — I10 Essential (primary) hypertension: Secondary | ICD-10-CM | POA: Diagnosis not present

## 2024-03-25 DIAGNOSIS — M542 Cervicalgia: Secondary | ICD-10-CM | POA: Diagnosis not present

## 2024-03-25 DIAGNOSIS — R519 Headache, unspecified: Secondary | ICD-10-CM | POA: Diagnosis not present

## 2024-03-25 DIAGNOSIS — M4802 Spinal stenosis, cervical region: Secondary | ICD-10-CM | POA: Diagnosis not present

## 2024-03-25 DIAGNOSIS — Z043 Encounter for examination and observation following other accident: Secondary | ICD-10-CM | POA: Diagnosis not present

## 2024-03-25 MED ORDER — OXYCODONE-ACETAMINOPHEN 5-325 MG PO TABS: 1.0000 | ORAL_TABLET | Freq: Four times a day (QID) | ORAL | 0 refills | Status: AC | PRN

## 2024-03-25 NOTE — ED Triage Notes (Signed)
 Pt to ED via POV from home. Pt reports rolled off the bed while sleeping and hit his head on ground. Pt reports pain to head, neck and upper back. Pt is on blood thinner.

## 2024-03-25 NOTE — ED Notes (Signed)
 Pt to imaging at this time

## 2024-03-25 NOTE — Discharge Instructions (Addendum)
 No driving within 8 hours of use of oxycodone .  Do not mix this with any other sedating medications.  Use only as prescribed.

## 2024-03-25 NOTE — ED Provider Notes (Signed)
 [facility] Provider Note    Event Date/Time   First MD Initiated Contact with Patient 03/25/24 1759     (approximate)   History   Fall   HPI  Eric [name] is a 68 y.o. male history of coronary disease type 2 diabetes and hypertension  Patient reports he takes Plavix .  This morning patient was sleeping, he reports he rolled out of bed.  About 4 AM.  He landed on the floor.  Reports throughout the day to having soreness across the back of his skull and across his middle neck that radiates slightly towards his shoulder blades more so on the right  Feels like his neck muscles are stiff and tight.  Did not pass out, reports he just rolled out of bed.  He got himself up.  He has not felt ill or sick or otherwise.  He reports that he wants to make sure he has not suffered any internal injury though because he hit the floor pretty hard when he fell out of the bed  No numbness or weakness.  Walking normally good strength is not noticed any tingling or other concerns.  No other injury.  History of previous shoulder surgeries.  Reports no issues with the arms legs or other place but just more of his neck.  No trouble breathing no chest pain     Physical Exam   Triage Vital Signs: ED Triage Vitals  Encounter Vitals Group     BP 03/25/24 1738 (!) 158/95     Girls Systolic BP Percentile --      Girls Diastolic BP Percentile --      Boys Systolic BP Percentile --      Boys Diastolic BP Percentile --      Pulse Rate 03/25/24 1738 78     Resp 03/25/24 1738 18     Temp 03/25/24 1738 97.6 F (36.4 C)     Temp Source 03/25/24 1738 Oral     SpO2 03/25/24 1738 98 %     Weight --      Height --      Head Circumference --      Peak Flow --      Pain Score 03/25/24 1739 7     Pain Loc --      Pain Education --      Exclude from Growth Chart --     Most recent vital signs: Vitals:   03/25/24 1738 03/25/24 1848  BP: (!) 158/95 (!) 142/82  Pulse: 78 77   Resp: 18 17  Temp: 97.6 F (36.4 C)   SpO2: 98% 95%     General: Awake, no distress.  Normocephalic atraumatic.  Slightly sore to palpation across the posterior occiput also no obvious midline cervical tenderness but reports tenderness more along the right lower paraspinous region of the neck.  No deformity.  Moves all extremities 5-5 strength normal sensation excellent handgrip strength, patient able to ambulate without difficulty. CV:  Good peripheral perfusion.  Normal work of breathing Resp:  Normal effort.  Abd:  No distention.  Other:     ED Results / Procedures / Treatments   Labs (all labs ordered are listed, but only abnormal results are displayed) Labs Reviewed - No data to display   EKG     RADIOLOGY  CT Cervical Spine Wo Contrast Result Date: 03/25/2024 CLINICAL DATA:  Fall at home, rolled off the bed. Struck head on the ground. Neck pain. EXAM: CT CERVICAL  SPINE WITHOUT CONTRAST TECHNIQUE: Multidetector CT imaging of the cervical spine was performed without intravenous contrast. Multiplanar CT image reconstructions were also generated. RADIATION DOSE REDUCTION: This exam was performed according to the departmental dose-optimization program which includes automated exposure control, adjustment of the mA and/or kV according to patient size and/or use of iterative reconstruction technique. COMPARISON:  None Available. FINDINGS: Alignment: Normal. Skull base and vertebrae: No acute fracture. Cervical vertebral body heights are maintained. Slight loss of height of T1 superior endplate with present on prior lung cancer screening CT and is chronic. The dens and skull base are intact. Soft tissues and spinal canal: No prevertebral fluid or swelling. No visible canal hematoma. There is ossification of the posterior longitudinal ligament at C4. Disc levels:  Disc space narrowing and spurring at C5-C6. Upper chest: No acute findings. Other: Carotid calcifications. IMPRESSION: 1. No  acute fracture or subluxation of the cervical spine. 2. Degenerative disc disease at C5-C6. Electronically Signed   By: Andrea Gasman M.D.   On: 03/25/2024 18:49   CT Head Wo Contrast Result Date: 03/25/2024 CLINICAL DATA:  Fall at home, rolled off the bed. Struck head on the ground. Head pain. EXAM: CT HEAD WITHOUT CONTRAST TECHNIQUE: Contiguous axial images were obtained from the base of the skull through the vertex without intravenous contrast. RADIATION DOSE REDUCTION: This exam was performed according to the departmental dose-optimization program which includes automated exposure control, adjustment of the mA and/or kV according to patient size and/or use of iterative reconstruction technique. COMPARISON:  None Available. FINDINGS: Brain: No intracranial hemorrhage, mass effect, or midline shift. No hydrocephalus. The basilar cisterns are patent. No evidence of territorial infarct or acute ischemia. No extra-axial or intracranial fluid collection. Vascular: Atherosclerosis of skullbase vasculature without hyperdense vessel or abnormal calcification. Skull: No fracture or focal lesion. Sinuses/Orbits: No acute findings. Mucosal thickening in the ethmoid air cells. No mastoid effusion. Other: No confluent scalp hematoma. IMPRESSION: No acute intracranial abnormality. No skull fracture. Electronically Signed   By: Andrea Gasman M.D.   On: 03/25/2024 18:45      PROCEDURES:  Critical Care performed: No  Procedures   MEDICATIONS ORDERED IN ED: Medications - No data to display   IMPRESSION / MDM / ASSESSMENT AND PLAN / ED COURSE  I reviewed the triage vital signs and the nursing notes.                              Differential diagnosis includes, but is not limited to, contusion, muscular strain, doubtful that he would have an acute cervical spinal injury or bony fracture based on mechanism but he is also on Plavix  and given his concern of pain CT imaging of the head and neck is ordered.   He has very reassuring neurologic examination and is normocephalic atraumatic without obvious deformity injury or deficit.  No preceding or ongoing symptoms of illness other than soreness.  Patient does report that in the past has used oxycodone  for shoulder surgeries, if if he could perhaps receive a prescription for that to use the next couple of nights for pain and discomfort.  Shared medical decision making, he drove himself here, but would be amenable to not driving while taking oxycodone  and has used it before with good success.  Use only as prescribed.  Good be reasonable for have not have this on hand for a very short course for musculoskeletal strain type symptoms.  Patient's presentation is most  consistent with acute complicated illness / injury requiring diagnostic workup.      Clinical Course as of 03/25/24 1933  Mon Mar 25, 2024  8162 CT head inter by me as grossly negative for acute large hemorrhage.  Awaiting radiology read [MQ]  1930 Patient fully alert well-oriented.  Advises he will not drive when using oxycodone , not mix with any other sedatives.  He is awake alert well-oriented very pleasant.  Reviewed his reassuring CT findings.  Discussed careful return precautions.  Patient agreeable [MQ]  1931 Return precautions and treatment recommendations and follow-up discussed with the patient who is agreeable with the plan.  [MQ]    Clinical Course User Index [MQ] Dicky Anes, MD     FINAL CLINICAL IMPRESSION(S) / ED DIAGNOSES   Final diagnoses:  Injury of head, initial encounter  Strain of neck muscle, initial encounter     Rx / DC Orders   ED Discharge Orders          Ordered    oxyCODONE -acetaminophen  (PERCOCET/ROXICET) 5-325 MG tablet  Every 6 hours PRN        03/25/24 1932             Note:  This document was prepared using Dragon voice recognition software and may include unintentional dictation errors.   Dicky Anes, MD 03/25/24 [phone]

## 2024-04-20 ENCOUNTER — Other Ambulatory Visit: Payer: Self-pay | Admitting: Cardiovascular Disease

## 2024-04-20 ENCOUNTER — Other Ambulatory Visit: Payer: Self-pay | Admitting: Family Medicine

## 2024-04-22 ENCOUNTER — Ambulatory Visit: Admitting: Family Medicine

## 2024-04-22 NOTE — Progress Notes (Signed)
 Eric [name]                                          MRN: [id number]   04/22/2024   The [facility] Quality Team Specialist reviewed this patient medical record for the purposes of chart review for care gap closure. The following were reviewed: chart review for care gap closure-kidney health evaluation for diabetes:eGFR  and uACR.    [facility] Quality Team

## 2024-04-22 NOTE — Telephone Encounter (Signed)
 This has been given by cardiology in the past. Should this be sent to them?

## 2024-04-26 ENCOUNTER — Telehealth: Payer: Self-pay | Admitting: Cardiovascular Disease

## 2024-04-26 MED ORDER — CLOPIDOGREL BISULFATE 75 MG PO TABS: 75.0000 mg | ORAL_TABLET | Freq: Every day | ORAL | 1 refills | Status: AC

## 2024-04-26 NOTE — Telephone Encounter (Signed)
*  STAT* If patient is at the pharmacy, call can be transferred to refill team.   1. Which medications need to be refilled? (please list name of each medication and dose if known)  clopidogrel  (PLAVIX ) 75 MG tablet   2. Which pharmacy/location (including street and city if local pharmacy) is medication to be sent to? [facility] [id number] - [place], [place] - [address]  3. Do they need a 30 day or 90 day supply?  90 day supply

## 2024-04-26 NOTE — Telephone Encounter (Signed)
 Pt's medication was sent to pt's pharmacy as requested. Confirmation received.

## 2024-04-29 ENCOUNTER — Encounter: Payer: Self-pay | Admitting: Family Medicine

## 2024-04-29 ENCOUNTER — Ambulatory Visit (INDEPENDENT_AMBULATORY_CARE_PROVIDER_SITE_OTHER): Admitting: Family Medicine

## 2024-04-29 VITALS — BP 116/68 | HR 72 | Temp 98.2°F | Ht 69.0 in | Wt 245.0 lb

## 2024-04-29 DIAGNOSIS — K219 Gastro-esophageal reflux disease without esophagitis: Secondary | ICD-10-CM

## 2024-04-29 DIAGNOSIS — Z23 Encounter for immunization: Secondary | ICD-10-CM | POA: Diagnosis not present

## 2024-04-29 DIAGNOSIS — M542 Cervicalgia: Secondary | ICD-10-CM

## 2024-04-29 DIAGNOSIS — Z7985 Long-term (current) use of injectable non-insulin antidiabetic drugs: Secondary | ICD-10-CM

## 2024-04-29 DIAGNOSIS — I10 Essential (primary) hypertension: Secondary | ICD-10-CM

## 2024-04-29 DIAGNOSIS — Z7984 Long term (current) use of oral hypoglycemic drugs: Secondary | ICD-10-CM

## 2024-04-29 DIAGNOSIS — E1169 Type 2 diabetes mellitus with other specified complication: Secondary | ICD-10-CM

## 2024-04-29 DIAGNOSIS — N529 Male erectile dysfunction, unspecified: Secondary | ICD-10-CM | POA: Diagnosis not present

## 2024-04-29 LAB — POCT GLYCOSYLATED HEMOGLOBIN (HGB A1C): Hemoglobin A1C: 6.2 % — AB (ref 4.0–5.6)

## 2024-04-29 MED ORDER — SILDENAFIL CITRATE 100 MG PO TABS: 50.0000 mg | ORAL_TABLET | Freq: Every day | ORAL | 5 refills | Status: AC | PRN

## 2024-04-29 MED ORDER — CYCLOBENZAPRINE HCL 10 MG PO TABS: 10.0000 mg | ORAL_TABLET | Freq: Three times a day (TID) | ORAL | 3 refills | Status: AC | PRN

## 2024-04-29 NOTE — Assessment & Plan Note (Addendum)
 Chronic, stable. Continue current regimen.

## 2024-04-29 NOTE — Assessment & Plan Note (Addendum)
 Viagra  Rx printed for patient per his request.  He is aware not to mix with SL nitro

## 2024-04-29 NOTE — Assessment & Plan Note (Signed)
 10 lb weight loss with healthy diet changes and restarting ozempic  - continue current regimen. Congratulated!

## 2024-04-29 NOTE — Assessment & Plan Note (Addendum)
 Refilled flexeril  to use PRN.  Consider PT referral

## 2024-04-29 NOTE — Assessment & Plan Note (Addendum)
 Chronic, improvement noted along with weight loss - congratulated patient! Continue current regimen including ozempic  1mg  weekly and metformin  500mg  bid.  RTC 3 mo CPE

## 2024-04-29 NOTE — Progress Notes (Signed)
 Ph: [phone] Fax: [phone]   Patient ID: Eric [name], male    DOB: 01/23/1956, 68 y.o.   MRN: [id number]  This visit was conducted in person.  BP 116/68   Pulse 72   Temp 98.2 F (36.8 C) (Oral)   Ht 5' 9 (1.753 m)   Wt 245 lb (111.1 kg)   SpO2 95%   BMI 36.18 kg/m    CC: DM f/u visit  Subjective:   HPI: Eric [name] is a 68 y.o. male presenting on 04/29/2024 for Medical Management of Chronic Issues   Clemens out of bed last month - due to vivid dream. Hit head/neck. Seen at ER where CT head and neck imaging was reassuring. Having ongoing neck pain with radiation into L shoulder.   10 lb weight loss in 4 months - healthy diet changes to low carb - meat, vegetables and salads. No bread, dessert.   Due to diarrhea, last visit we dropped metformin  from 1500mg  to 1000mg  daily. Stools are more solid now.   DM - does not regularly check sugars. Compliant with antihyperglycemic regimen which includes: ozempic  1 mg weekly (restarted 11/2023), metformin  500mg  bid. Tolerating well without nausea, diarrhea, constipation or epigastric pain. Denies low sugars or hypoglycemic symptoms. Denies paresthesias, blurry vision. Last diabetic eye exam 10/2023. Glucometer brand: doesn't use this. Last foot exam: 11/2023. DSME: declines. Lab Results  Component Value Date   HGBA1C 6.2 (A) 04/29/2024   Diabetic Foot Exam - Simple   No data filed    No results found for: MACKEY CURRENT   Food allergies - all grains and soy cause tongue/lip swelling - managing with pepcid  bid and allegra . Also has epi pen. Saw allergist about 10 yrs ago. Notes he does not monitor diet anymore as symptoms are well controlled on this regimen.   Insomnia - trazodone  50mg  nightly hasn't been effective     Relevant past medical, surgical, family and social history reviewed and updated as indicated. Interim medical history since our last visit reviewed. Allergies and medications reviewed and  updated. Outpatient Medications Prior to Visit  Medication Sig Dispense Refill   Ascorbic Acid (VITAMIN C) 1000 MG tablet Take 1,000 mg by mouth every evening.      cholecalciferol (VITAMIN D3) 25 MCG (1000 UT) tablet Take 1,000 Units by mouth every evening.      ciprofloxacin  (CIPRO ) 500 MG tablet Take 500 mg by mouth 2 (two) times daily.     clopidogrel  (PLAVIX ) 75 MG tablet Take 1 tablet (75 mg total) by mouth daily. 90 tablet 1   Coenzyme Q10 200 MG capsule Take 200 mg by mouth daily in the afternoon.      diphenhydrAMINE (BENADRYL) 25 MG tablet Take 25 mg by mouth every 6 (six) hours as needed for allergies.      docusate sodium (COLACE) 100 MG capsule Take 100 mg by mouth daily.     EPINEPHrine  0.3 mg/0.3 mL IJ SOAJ injection INJECT IN THE MUSCLE AS DIRECTED 2 each 1   famotidine  (PEPCID ) 20 MG tablet TAKE 1 TABLET BY MOUTH 2 TIMES A DAY 180 tablet 1   fexofenadine  (ALLEGRA ) 180 MG tablet TAKE 1 TABLET BY MOUTH DAILY 90 tablet 3   hydrochlorothiazide  (HYDRODIURIL ) 25 MG tablet TAKE 1 TABLET BY MOUTH DAILY 90 tablet 1   hydrocortisone  2.5 % cream Apply topically 3 (three) times a week. Apply to scaly areas on face 3 nights a week, Tuesday, Thursday and Saturday for Seborrheic Dermatits 30  g 11   ipratropium (ATROVENT ) 0.06 % nasal spray Place 2 sprays into both nostrils 4 (four) times daily. 15 mL 0   ketoconazole  (NIZORAL ) 2 % cream Apply 1 Application topically 3 (three) times a week. Apply to scaly areas on face 3 nights weekly, Monday, Wednesday, Friday for seborrheic dermatitis 60 g 11   losartan  (COZAAR ) 100 MG tablet TAKE 1 TABLET BY MOUTH DAILY 90 tablet 1   metFORMIN  (GLUCOPHAGE ) 500 MG tablet Take 1 tablet (500 mg total) by mouth 2 (two) times daily with a meal. 180 tablet 3   Methylcellulose, Laxative, (CITRUCEL) 500 MG TABS Take 1,000 mg by mouth at bedtime.     Multiple Vitamin (MULTIVITAMIN WITH MINERALS) TABS tablet Take 1 tablet by mouth every evening.     mupirocin  ointment  (BACTROBAN ) 2 % Apply 1 Application topically daily. With dressing changes 22 g 0   nitroGLYCERIN  (NITROSTAT ) 0.4 MG SL tablet Place 1 tablet (0.4 mg total) under the tongue every 5 (five) minutes x 3 doses as needed for chest pain. PLACE 1 TABLET UNDER THE TONGUE IF NEEDED EVERY 5 MINUTES FOR CHEST PAIN FOR 3 DOSES IF NO RELIEF AFER FIRST DOSE CALL PRESCRIBER OR 911 25 tablet 0   ondansetron  (ZOFRAN -ODT) 4 MG disintegrating tablet Take 1 tablet (4 mg total) by mouth every 8 (eight) hours as needed for nausea or vomiting. 20 tablet 0   ondansetron  (ZOFRAN -ODT) 4 MG disintegrating tablet Take 1 tablet (4 mg total) by mouth every 8 (eight) hours as needed for nausea or vomiting. 20 tablet 0   oxyCODONE -acetaminophen  (PERCOCET/ROXICET) 5-325 MG tablet Take 1 tablet by mouth every 6 (six) hours as needed for severe pain (pain score 7-10). 8 tablet 0   pantoprazole  (PROTONIX ) 40 MG tablet Take 1 tablet (40 mg total) by mouth daily. 90 tablet 2   propranolol  (INDERAL ) 20 MG tablet Take 1 tablet (20 mg total) by mouth 3 (three) times daily as needed (tachycardia). 30 tablet 3   rosuvastatin  (CRESTOR ) 40 MG tablet TAKE 1 TABLET BY MOUTH DAILY 90 tablet 1   Semaglutide , 1 MG/DOSE, (OZEMPIC , 1 MG/DOSE,) 4 MG/3ML SOPN DIAL AND INJECT UNDER THE SKIN 1 MG WEEKLY 3 mL 5   traZODone  (DESYREL ) 50 MG tablet TAKE 1/2 TO 1 TABLET BY MOUTH BY MOUTH AT BEDTIME AS NEEDED FOR SLEEP 90 tablet 3   TURMERIC PO Take 1,000 mg by mouth in the morning and at bedtime.     baclofen  (LIORESAL ) 10 MG tablet Take 1 tablet (10 mg total) by mouth 3 (three) times daily. 30 each 0   cyclobenzaprine  (FLEXERIL ) 10 MG tablet Take 1 tablet (10 mg total) by mouth 3 (three) times daily as needed for muscle spasms. 30 tablet 1   sildenafil  (VIAGRA ) 100 MG tablet Take 0.5-1 tablets (50-100 mg total) by mouth daily as needed for erectile dysfunction. 20 tablet 3   omeprazole  (PRILOSEC) 40 MG capsule Take 40 mg by mouth daily. (Patient not taking:  Reported on 04/29/2024)     predniSONE  (STERAPRED UNI-PAK 21 TAB) 10 MG (21) TBPK tablet Take 6 tablets on day 1, 5 tablets day 2, 4 tablets day 3, 3 tablets day 4, 2 tablets day 5, 1 tablet day 6 (Patient not taking: Reported on 04/29/2024) 21 tablet 0   Semaglutide ,0.25 or 0.5MG /DOS, (OZEMPIC , 0.25 OR 0.5 MG/DOSE,) 2 MG/3ML SOPN Inject 0.5 mg into the skin once a week. For the first month then increase to 1mg  weekly (Patient not taking: Reported on 04/29/2024)  3 mL 0   No facility-administered medications prior to visit.     Per HPI unless specifically indicated in ROS section below Review of Systems  Objective:  BP 116/68   Pulse 72   Temp 98.2 F (36.8 C) (Oral)   Ht 5' 9 (1.753 m)   Wt 245 lb (111.1 kg)   SpO2 95%   BMI 36.18 kg/m   Wt Readings from Last 3 Encounters:  04/29/24 245 lb (111.1 kg)  12/15/23 255 lb (115.7 kg)  11/10/23 256 lb 6.4 oz (116.3 kg)      Physical Exam Vitals and nursing note reviewed.  Constitutional:      Appearance: Normal appearance. He is not ill-appearing.  HENT:     Head: Normocephalic and atraumatic.     Mouth/Throat:     Mouth: Mucous membranes are moist.     Pharynx: Oropharynx is clear. No oropharyngeal exudate or posterior oropharyngeal erythema.  Eyes:     Extraocular Movements: Extraocular movements intact.     Conjunctiva/sclera: Conjunctivae normal.     Pupils: Pupils are equal, round, and reactive to light.  Cardiovascular:     Rate and Rhythm: Normal rate and regular rhythm.     Pulses: Normal pulses.     Heart sounds: Normal heart sounds. No murmur heard. Pulmonary:     Effort: Pulmonary effort is normal. No respiratory distress.     Breath sounds: Normal breath sounds. No wheezing, rhonchi or rales.  Musculoskeletal:     Cervical back: Normal range of motion and neck supple.     Right lower leg: No edema.     Left lower leg: No edema.     Comments: See HPI for foot exam if done  Skin:    General: Skin is warm and dry.      Findings: No rash.  Neurological:     Mental Status: He is alert.  Psychiatric:        Mood and Affect: Mood normal.        Behavior: Behavior normal.       Results for orders placed or performed in visit on 04/29/24  POCT glycosylated hemoglobin (Hb A1C)   Collection Time: 04/29/24  2:29 PM  Result Value Ref Range   Hemoglobin A1C 6.2 (A) 4.0 - 5.6 %   HbA1c POC (<> result, manual entry)     HbA1c, POC (prediabetic range)     HbA1c, POC (controlled diabetic range)      Assessment & Plan:   Problem List Items Addressed This Visit     Severe obesity (BMI 35.0-39.9) with comorbidity (HCC)   10 lb weight loss with healthy diet changes and restarting ozempic  - continue current regimen. Congratulated!      HYPERTENSION, BENIGN   Chronic, stable. Continue current regimen.      Relevant Medications   sildenafil  (VIAGRA ) 100 MG tablet   Type 2 diabetes mellitus with other specified complication (HCC) - Primary   Chronic, improvement noted along with weight loss - congratulated patient! Continue current regimen including ozempic  1mg  weekly and metformin  500mg  bid.  RTC 3 mo CPE      Relevant Orders   POCT glycosylated hemoglobin (Hb A1C) (Completed)   GERD (gastroesophageal reflux disease)   Stable period on pantoprazole  and pepcid  - continue this.       Neck pain on left side   Refilled flexeril  to use PRN.  Consider PT referral      Erectile dysfunction   Viagra  Rx printed  for patient per his request.  He is aware not to mix with SL nitro      Other Visit Diagnoses       Encounter for immunization       Relevant Orders   Flu vaccine HIGH DOSE PF(Fluzone Trivalent) (Completed)        Meds ordered this encounter  Medications   cyclobenzaprine  (FLEXERIL ) 10 MG tablet    Sig: Take 1 tablet (10 mg total) by mouth 3 (three) times daily as needed for muscle spasms.    Dispense:  30 tablet    Refill:  3   sildenafil  (VIAGRA ) 100 MG tablet    Sig: Take  0.5-1 tablets (50-100 mg total) by mouth daily as needed for erectile dysfunction.    Dispense:  20 tablet    Refill:  5    Orders Placed This Encounter  Procedures   Flu vaccine HIGH DOSE PF(Fluzone Trivalent)   POCT glycosylated hemoglobin (Hb A1C)    Patient Instructions  Flu shot today  You are doing well today - congratulations on weight loss! Continue current medicines Flexeril  refilled today May try trazodone  1.5 tablets at night for sleep. If ineffective, may increase to 2 tablets at night for sleep. Update us  with effect.  Return in 3 months or physical/wellness visit   Follow up plan: Return in about 3 months (around 07/30/2024) for annual exam, prior fasting for blood work, medicare wellness visit.  Anton Blas, MD

## 2024-04-29 NOTE — Patient Instructions (Addendum)
 Flu shot today  You are doing well today - congratulations on weight loss! Continue current medicines Flexeril  refilled today May try trazodone  1.5 tablets at night for sleep. If ineffective, may increase to 2 tablets at night for sleep. Update us  with effect.  Return in 3 months or physical/wellness visit

## 2024-04-29 NOTE — Assessment & Plan Note (Signed)
 Stable period on pantoprazole  and pepcid  - continue this.

## 2024-04-30 ENCOUNTER — Encounter: Payer: Self-pay | Admitting: Pharmacist

## 2024-04-30 ENCOUNTER — Ambulatory Visit: Payer: Medicare HMO | Admitting: Dermatology

## 2024-04-30 DIAGNOSIS — Z8589 Personal history of malignant neoplasm of other organs and systems: Secondary | ICD-10-CM

## 2024-04-30 DIAGNOSIS — L219 Seborrheic dermatitis, unspecified: Secondary | ICD-10-CM | POA: Diagnosis not present

## 2024-04-30 DIAGNOSIS — Z7189 Other specified counseling: Secondary | ICD-10-CM

## 2024-04-30 DIAGNOSIS — L918 Other hypertrophic disorders of the skin: Secondary | ICD-10-CM | POA: Diagnosis not present

## 2024-04-30 DIAGNOSIS — L820 Inflamed seborrheic keratosis: Secondary | ICD-10-CM | POA: Diagnosis not present

## 2024-04-30 DIAGNOSIS — Z86018 Personal history of other benign neoplasm: Secondary | ICD-10-CM

## 2024-04-30 DIAGNOSIS — L578 Other skin changes due to chronic exposure to nonionizing radiation: Secondary | ICD-10-CM | POA: Diagnosis not present

## 2024-04-30 DIAGNOSIS — D492 Neoplasm of unspecified behavior of bone, soft tissue, and skin: Secondary | ICD-10-CM | POA: Diagnosis not present

## 2024-04-30 DIAGNOSIS — W908XXA Exposure to other nonionizing radiation, initial encounter: Secondary | ICD-10-CM

## 2024-04-30 DIAGNOSIS — D229 Melanocytic nevi, unspecified: Secondary | ICD-10-CM

## 2024-04-30 DIAGNOSIS — L821 Other seborrheic keratosis: Secondary | ICD-10-CM

## 2024-04-30 DIAGNOSIS — Z79899 Other long term (current) drug therapy: Secondary | ICD-10-CM

## 2024-04-30 DIAGNOSIS — D225 Melanocytic nevi of trunk: Secondary | ICD-10-CM | POA: Diagnosis not present

## 2024-04-30 DIAGNOSIS — Z1283 Encounter for screening for malignant neoplasm of skin: Secondary | ICD-10-CM

## 2024-04-30 DIAGNOSIS — L814 Other melanin hyperpigmentation: Secondary | ICD-10-CM | POA: Diagnosis not present

## 2024-04-30 MED ORDER — KETOCONAZOLE 2 % EX CREA: 1.0000 | TOPICAL_CREAM | CUTANEOUS | 11 refills | Status: AC

## 2024-04-30 MED ORDER — HYDROCORTISONE 2.5 % EX CREA: TOPICAL_CREAM | CUTANEOUS | 11 refills | Status: AC

## 2024-04-30 NOTE — Progress Notes (Signed)
 Pharmacy Quality Measure Review  This patient is appearing on a report for being at risk of failing the adherence measure for diabetes medications this calendar year.   Medication: metformin  500 mg  Last fill date: 01/30/24 for 90 day supply  Contacted pharmacy to facilitate refills. 2 additional refills remaining.

## 2024-04-30 NOTE — Patient Instructions (Addendum)

## 2024-04-30 NOTE — Progress Notes (Unsigned)
 Follow-Up Visit   Subjective  Eric [name] is a 68 y.o. male who presents for the following: Skin Cancer Screening and Full Body Skin Exam  The patient presents for Total-Body Skin Exam (TBSE) for skin cancer screening and mole check. The patient has spots, moles and lesions to be evaluated, some may be new or changing and the patient may have concern these could be cancer.  The following portions of the chart were reviewed this encounter and updated as appropriate: medications, allergies, medical history  Review of Systems:  No other skin or systemic complaints except as noted in HPI or Assessment and Plan.  Objective  Well appearing patient in no apparent distress; mood and affect are within normal limits.  A full examination was performed including scalp, head, eyes, ears, nose, lips, neck, chest, axillae, abdomen, back, buttocks, bilateral upper extremities, bilateral lower extremities, hands, feet, fingers, toes, fingernails, and toenails. All findings within normal limits unless otherwise noted below.   Relevant physical exam findings are noted in the Assessment and Plan.  trunk, arms x 7 Stuck-on, waxy, tan-brown papules and plaques -- Discussed benign etiology and prognosis.  Left sup lateral buttock below waistline 0.5 cm Irregular brown macule      Assessment & Plan   SKIN CANCER SCREENING PERFORMED TODAY.  ACTINIC DAMAGE - Chronic condition, secondary to cumulative UV/sun exposure - diffuse scaly erythematous macules with underlying dyspigmentation - Recommend daily broad spectrum sunscreen SPF 30+ to sun-exposed areas, reapply every 2 hours as needed.  - Staying in the shade or wearing long sleeves, sun glasses (UVA+UVB protection) and wide brim hats (4-inch brim around the entire circumference of the hat) are also recommended for sun protection.  - Call for new or changing lesions.  LENTIGINES, SEBORRHEIC KERATOSES, HEMANGIOMAS - Benign normal skin  lesions - Benign-appearing - Call for any changes  MELANOCYTIC NEVI - Tan-brown and/or pink-flesh-colored symmetric macules and papules - Benign appearing on exam today - Observation - Call clinic for new or changing moles - Recommend daily use of broad spectrum spf 30+ sunscreen to sun-exposed areas.   HISTORY OF DYSPLASTIC NEVUS No evidence of recurrence today Recommend regular full body skin exams Recommend daily broad spectrum sunscreen SPF 30+ to sun-exposed areas, reapply every 2 hours as needed.  Call if any new or changing lesions are noted between office visits   Acrochordons (Skin Tags) - Fleshy, skin-colored pedunculated papules - Benign appearing.  - Observe. - If desired, they can be removed with an in office procedure that is not covered by insurance. - Please call the clinic if you notice any new or changing lesions.    HISTORY OF SQUAMOUS CELL CARCINOMA OF THE SKIN - No evidence of recurrence today - No lymphadenopathy - Recommend regular full body skin exams - Recommend daily broad spectrum sunscreen SPF 30+ to sun-exposed areas, reapply every 2 hours as needed.  - Call if any new or changing lesions are noted between office visits  INFLAMED SEBORRHEIC KERATOSIS trunk, arms x 7 Symptomatic, irritating, patient would like treated.  Destruction of lesion - trunk, arms x 7 Complexity: simple   Destruction method: cryotherapy   Informed consent: discussed and consent obtained   Timeout:  patient name, date of birth, surgical site, and procedure verified Lesion destroyed using liquid nitrogen: Yes   Region frozen until ice ball extended beyond lesion: Yes   Outcome: patient tolerated procedure well with no complications   Post-procedure details: wound care instructions given  NEOPLASM OF SKIN Left sup lateral buttock below waistline Epidermal / dermal shaving  Lesion diameter (cm):  0.5 Informed consent: discussed and consent obtained   Timeout:  patient name, date of birth, surgical site, and procedure verified   Procedure prep:  Patient was prepped and draped in usual sterile fashion Prep type:  Isopropyl alcohol Anesthesia: the lesion was anesthetized in a standard fashion   Anesthetic:  1% lidocaine  w/ epinephrine  1-100,000 buffered w/ 8.4% NaHCO3 Hemostasis achieved with: pressure, aluminum chloride and electrodesiccation   Outcome: patient tolerated procedure well   Post-procedure details: sterile dressing applied and wound care instructions given   Dressing type: bandage and petrolatum    Specimen 1 - Surgical pathology Differential Diagnosis: R/O Dysplastic nevus   Check Margins: No  Seborrheic dermatitis b/l brows, glabella   Seborrheic Dermatitis  -  is a chronic persistent rash characterized by pinkness and scaling most commonly of the mid face but also can occur on the scalp (dandruff), ears; mid chest, mid back and groin.  It tends to be exacerbated by stress and cooler weather.  People who have neurologic disease may experience new onset or exacerbation of existing seborrheic dermatitis.  The condition is not curable but treatable and can be controlled.   Cont Ketoconazole  2  % cream - apply topically to aa's of eyebrows and forehead once daily M-W-F.  Cont Hydrocortisone  2.5 % cream - apply topically to aa's of eyebrows and forehead daily on T-Th-Sat.    Topical steroids (such as triamcinolone , fluocinolone, fluocinonide, mometasone, clobetasol, halobetasol, betamethasone, hydrocortisone ) can cause thinning and lightening of the skin if they are used for too long in the same area. Your physician has selected the right strength medicine for your problem and area affected on the body. Please use your medication only as directed by your physician to prevent side effects.    Return in about 1 year (around 04/30/2025) for TBSE, hx of SCC, hx of Dysplastic nevus .  IFay Kirks, CMA, am acting as scribe for Alm Rhyme, MD .   Documentation: I have reviewed the above documentation for accuracy and completeness, and I agree with the above.  Alm Rhyme, MD

## 2024-05-01 ENCOUNTER — Encounter: Payer: Self-pay | Admitting: Dermatology

## 2024-05-01 ENCOUNTER — Ambulatory Visit: Payer: Self-pay | Admitting: Dermatology

## 2024-05-01 LAB — SURGICAL PATHOLOGY

## 2024-05-01 NOTE — Telephone Encounter (Addendum)
 Called and discussed bx results with patient he verbalized understanding and denied further questions. Will recheck at next followup.   ----- Message from Alm Rhyme sent at 05/01/2024  4:37 PM EDT ----- FINAL DIAGNOSIS        1. Skin, left sup lateral buttock below waistline :       DYSPLASTIC JUNCTIONAL LENTIGINOUS NEVUS WITH MODERATE ATYPIA, LIMITED MARGINS       FREE   Moderate Dysplastic Recheck next visit ----- Message ----- From: Interface, Lab In [facility] Sent: 05/01/2024   3:50 PM EDT To: Alm JAYSON Rhyme, MD

## 2024-05-02 NOTE — Progress Notes (Signed)
 Eric [name]                                          MRN: [id number]   05/02/2024   The [facility] Quality Team Specialist reviewed this patient medical record for the purposes of chart review for care gap closure. The following were reviewed: abstraction for care gap closure-controlling blood pressure.    [facility] Quality Team
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
## 2024-05-13 ENCOUNTER — Encounter: Payer: Self-pay | Admitting: Cardiovascular Disease

## 2024-05-13 ENCOUNTER — Ambulatory Visit: Attending: Cardiovascular Disease | Admitting: Cardiovascular Disease

## 2024-05-13 VITALS — BP 120/60 | HR 77 | Ht 69.0 in | Wt 248.1 lb

## 2024-05-13 DIAGNOSIS — R079 Chest pain, unspecified: Secondary | ICD-10-CM | POA: Diagnosis not present

## 2024-05-13 DIAGNOSIS — E785 Hyperlipidemia, unspecified: Secondary | ICD-10-CM | POA: Diagnosis not present

## 2024-05-13 DIAGNOSIS — I25118 Atherosclerotic heart disease of native coronary artery with other forms of angina pectoris: Secondary | ICD-10-CM | POA: Diagnosis not present

## 2024-05-13 DIAGNOSIS — I4719 Other supraventricular tachycardia: Secondary | ICD-10-CM | POA: Diagnosis not present

## 2024-05-13 DIAGNOSIS — E118 Type 2 diabetes mellitus with unspecified complications: Secondary | ICD-10-CM | POA: Diagnosis not present

## 2024-05-13 DIAGNOSIS — I10 Essential (primary) hypertension: Secondary | ICD-10-CM

## 2024-05-13 DIAGNOSIS — E1169 Type 2 diabetes mellitus with other specified complication: Secondary | ICD-10-CM | POA: Diagnosis not present

## 2024-05-13 MED ORDER — NITROGLYCERIN 0.4 MG SL SUBL: 0.4000 mg | SUBLINGUAL_TABLET | SUBLINGUAL | 0 refills | Status: AC | PRN

## 2024-05-13 NOTE — Patient Instructions (Addendum)
 Medication Instructions:  Your physician recommends that you continue on your current medications as directed. Please refer to the Current Medication list given to you today.  *If you need a refill on your cardiac medications before your next appointment, please call your pharmacy*  Lab Work: No labs ordered today  If you have labs (blood work) drawn today and your tests are completely normal, you will receive your results only by: MyChart Message (if you have MyChart) OR A paper copy in the mail If you have any lab test that is abnormal or we need to change your treatment, we will call you to review the results.  Testing/Procedures: No test ordered today   Follow-Up: At [facility], you and your health needs are our priority.  As part of our continuing mission to provide you with exceptional heart care, our providers are all part of one team.  This team includes your primary Cardiologist (physician) and Advanced Practice Providers or APPs (Physician Assistants and Nurse Practitioners) who all work together to provide you with the care you need, when you need it.  Your next appointment:   6 month(s)  Provider:   You may see Timothy Gollan, MD or one of the following Advanced Practice Providers on your designated Care Team:   Laneta Pintos, NP Gildardo Labrador, PA-C Varney Gentleman, PA-C Cadence Roselle, PA-C Ronald Cockayne, NP Morey Ar, NP    We recommend signing up for the patient portal called "MyChart".  Sign up information is provided on this After Visit Summary.  MyChart is used to connect with patients for Virtual Visits (Telemedicine).  Patients are able to view lab/test results, encounter notes, upcoming appointments, etc.  Non-urgent messages can be sent to your provider as well.   To learn more about what you can do with MyChart, go to [email or web address].

## 2024-05-17 ENCOUNTER — Other Ambulatory Visit: Payer: Self-pay | Admitting: Cardiovascular Disease

## 2024-05-21 DIAGNOSIS — H40003 Preglaucoma, unspecified, bilateral: Secondary | ICD-10-CM | POA: Diagnosis not present

## 2024-05-21 DIAGNOSIS — H2513 Age-related nuclear cataract, bilateral: Secondary | ICD-10-CM | POA: Diagnosis not present

## 2024-06-06 ENCOUNTER — Other Ambulatory Visit: Payer: Self-pay | Admitting: Family Medicine

## 2024-06-06 DIAGNOSIS — E1169 Type 2 diabetes mellitus with other specified complication: Secondary | ICD-10-CM

## 2024-06-06 NOTE — Telephone Encounter (Signed)
 ERx

## 2024-06-10 ENCOUNTER — Other Ambulatory Visit: Payer: Self-pay | Admitting: Family Medicine

## 2024-06-11 ENCOUNTER — Telehealth: Payer: Self-pay

## 2024-06-11 NOTE — Telephone Encounter (Signed)
 Patient called regarding BX previously done in October at waistline. Patient states the area was red and hurting but he started applying antibiotic ointment and noticed improvement. Advised patient to cover with ointment or vaseline and cover with band aid, this will help healing and prevent any possible skin infections. aw

## 2024-06-18 ENCOUNTER — Telehealth: Payer: Self-pay | Admitting: Family Medicine

## 2024-06-18 MED ORDER — EPINEPHRINE 0.3 MG/0.3ML IJ SOAJ: INTRAMUSCULAR | 1 refills | Status: AC

## 2024-06-18 NOTE — Addendum Note (Signed)
 Addended by: RILLA BALLER on: 06/18/2024 10:29 AM   Modules accepted: Orders

## 2024-06-18 NOTE — Telephone Encounter (Signed)
 Copied from CRM [id number]. Topic: Clinical - Prescription Issue >> Jun 17, 2024 11:49 AM Rea ORN wrote: Reason for CRM: Pt called to advise that yesterday his pharmacy stated his epi pen rx was denied and he wanted to know why. After researching I advised the rx was denied because it was sent to pharmacy by other means. Pt would like the rx resent as soon as possible as he will be going out of town Friday and will be gone for 3 weeks.    Please call back [phone] once reordered

## 2024-06-18 NOTE — Telephone Encounter (Signed)
 Why was this denied initially? It was last refilled 05/2023 Please notify pt I have refilled to [facility] pharmacy.

## 2024-06-18 NOTE — Telephone Encounter (Signed)
 Called patient let know was sent in

## 2024-06-19 ENCOUNTER — Ambulatory Visit

## 2024-07-06 ENCOUNTER — Other Ambulatory Visit: Payer: Self-pay | Admitting: Cardiovascular Disease

## 2024-07-06 ENCOUNTER — Other Ambulatory Visit: Payer: Self-pay | Admitting: Family Medicine

## 2024-07-06 ENCOUNTER — Ambulatory Visit
Admission: EM | Admit: 2024-07-06 | Discharge: 2024-07-06 | Disposition: A | Discharge status: Home / Self Care | Attending: Physician Assistant | Admitting: Physician Assistant

## 2024-07-06 ENCOUNTER — Encounter: Payer: Self-pay | Admitting: Emergency Medicine

## 2024-07-06 DIAGNOSIS — R0981 Nasal congestion: Secondary | ICD-10-CM

## 2024-07-06 DIAGNOSIS — R051 Acute cough: Secondary | ICD-10-CM

## 2024-07-06 DIAGNOSIS — J069 Acute upper respiratory infection, unspecified: Secondary | ICD-10-CM | POA: Diagnosis not present

## 2024-07-06 DIAGNOSIS — F4320 Adjustment disorder, unspecified: Secondary | ICD-10-CM

## 2024-07-06 DIAGNOSIS — K219 Gastro-esophageal reflux disease without esophagitis: Secondary | ICD-10-CM

## 2024-07-06 LAB — POC COVID19/FLU A&B COMBO
Covid Antigen, POC: NEGATIVE
Influenza A Antigen, POC: NEGATIVE
Influenza B Antigen, POC: NEGATIVE

## 2024-07-06 MED ORDER — PSEUDOEPH-BROMPHEN-DM 30-2-10 MG/5ML PO SYRP: 10.0000 mL | ORAL_SOLUTION | Freq: Four times a day (QID) | ORAL | 0 refills | Status: AC | PRN

## 2024-07-06 MED ORDER — IPRATROPIUM BROMIDE 0.06 % NA SOLN: 2.0000 | Freq: Four times a day (QID) | NASAL | 0 refills | Status: DC
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
## 2024-07-06 NOTE — ED Triage Notes (Signed)
 Patient c/o sinus congestion, nasal congestion and sinus pressure that started on Thursday.  Patient wants a prescription for Augmentin .  Patient denies fevers.

## 2024-07-06 NOTE — Discharge Instructions (Addendum)
-  Negative flu and COVID testing  URI/COLD SYMPTOMS: Your exam today is consistent with a viral illness. Antibiotics are not indicated at this time. Use medications as directed, including cough syrup, nasal saline, and decongestants. Your symptoms should improve over the next few days and resolve within a couple of weeks. Increase rest and fluids. F/u if symptoms worsen or predominate such as sore throat, ear pain, productive cough, shortness of breath, or if you develop high fevers or worsening fatigue over the next several days.  Antibiotics are considered for sinus infection if  no improvement after about 10 days.

## 2024-07-09 ENCOUNTER — Other Ambulatory Visit: Payer: Self-pay | Admitting: Cardiovascular Disease

## 2024-07-09 ENCOUNTER — Other Ambulatory Visit: Payer: Self-pay | Admitting: Emergency Medicine

## 2024-07-09 NOTE — Telephone Encounter (Signed)
 Pt's pharmacy requesting a refill on non cardiac medication fexofenadine . Would Dr. Gollan like to refill this medication? Please address

## 2024-07-09 NOTE — Telephone Encounter (Signed)
 Requesting: Trazodone   Contract: No UDS:  Last Visit: 06/18/2024 Next Visit: 08/27/2024 Last Refill: 08/02/2023   Please Advise

## 2024-07-10 NOTE — Telephone Encounter (Signed)
°*  STAT* If patient is at the pharmacy, call can be transferred to refill team.   1. Which medications need to be refilled? (please list name of each medication and dose if known)   fexofenadine  (ALLEGRA ) 180 MG tablet   2. Would you like to learn more about the convenience, safety, & potential cost savings by using the [facility]?   3. Are you open to using the [facility] (Type [facility]. ).  4. Which pharmacy/location (including street and city if local pharmacy) is medication to be sent to?  [facility] [id number] - [place], [place] - [address]   5. Do they need a 30 day or 90 day supply?   90 day  Patient stated he has a little medication left.

## 2024-07-12 DIAGNOSIS — J0101 Acute recurrent maxillary sinusitis: Secondary | ICD-10-CM | POA: Diagnosis not present

## 2024-07-12 MED ORDER — FEXOFENADINE HCL 180 MG PO TABS: 180.0000 mg | ORAL_TABLET | Freq: Every day | ORAL | 3 refills | Status: AC

## 2024-07-12 NOTE — Telephone Encounter (Signed)
 Refill sent to preferred pharmacy.

## 2024-08-19 ENCOUNTER — Other Ambulatory Visit: Payer: Self-pay | Admitting: Family Medicine

## 2024-08-19 ENCOUNTER — Telehealth: Payer: Self-pay

## 2024-08-19 ENCOUNTER — Other Ambulatory Visit

## 2024-08-19 ENCOUNTER — Ambulatory Visit

## 2024-08-19 VITALS — Ht 69.0 in | Wt 245.5 lb

## 2024-08-19 DIAGNOSIS — K760 Fatty (change of) liver, not elsewhere classified: Secondary | ICD-10-CM

## 2024-08-19 DIAGNOSIS — Z125 Encounter for screening for malignant neoplasm of prostate: Secondary | ICD-10-CM

## 2024-08-19 DIAGNOSIS — E1169 Type 2 diabetes mellitus with other specified complication: Secondary | ICD-10-CM

## 2024-08-19 DIAGNOSIS — Z0000 Encounter for general adult medical examination without abnormal findings: Secondary | ICD-10-CM

## 2024-08-19 NOTE — Telephone Encounter (Signed)
 would like for the nurse to call to schedule fasting labwork appt prior to CPE

## 2024-08-19 NOTE — Patient Instructions (Addendum)
 Eric [name],  Thank you for taking the time for your Medicare Wellness Visit. I appreciate your continued commitment to your health goals. Please review the care plan we discussed, and feel free to reach out if I can assist you further.  Please note that Annual Wellness Visits do not include a physical exam. Some assessments may be limited, especially if the visit was conducted virtually. If needed, we may recommend an in-person follow-up with your provider.  Ongoing Care Seeing your primary care provider every 3 to 6 months helps us  monitor your health and provide consistent, personalized care.   Referrals If a referral was made during today's visit and you haven't received any updates within two weeks, please contact the referred provider directly to check on the status.  Recommended Screenings:  Health Maintenance  Topic Date Due   Pneumococcal Vaccine for age over 31 (2 of 2 - PCV) 03/11/2015   Kidney health urinalysis for diabetes  03/02/2024   COVID-19 Vaccine (8 - 2025-26 season) 03/25/2024   Medicare Annual Wellness Visit  05/04/2024   Hemoglobin A1C  10/28/2024   Eye exam for diabetics  11/19/2024   Yearly kidney function blood test for diabetes  12/07/2024   Complete foot exam   12/14/2024   DTaP/Tdap/Td vaccine (3 - Td or Tdap) 03/18/2025   Colon Cancer Screening  11/16/2027   Flu Shot  Completed   Hepatitis C Screening  Completed   Zoster (Shingles) Vaccine  Completed   Meningitis B Vaccine  Aged Out       08/19/2024    1:09 PM  Advanced Directives  Does Patient Have a Medical Advance Directive? Yes  Type of Estate Agent of [place];Living will  Does patient want to make changes to medical advance directive? Yes (Inpatient - patient requests chaplain consult to change a medical advance directive)  Copy of Healthcare Power of Attorney in Chart? No - copy requested    Vision: Annual vision screenings are recommended for early detection of  glaucoma, cataracts, and diabetic retinopathy. These exams can also reveal signs of chronic conditions such as diabetes and high blood pressure.  Dental: Annual dental screenings help detect early signs of oral cancer, gum disease, and other conditions linked to overall health, including heart disease and diabetes.
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
## 2024-08-20 NOTE — Telephone Encounter (Addendum)
 Patient is scheduled for labs 08/21/24.

## 2024-08-21 ENCOUNTER — Other Ambulatory Visit

## 2024-08-21 DIAGNOSIS — E785 Hyperlipidemia, unspecified: Secondary | ICD-10-CM

## 2024-08-21 DIAGNOSIS — K760 Fatty (change of) liver, not elsewhere classified: Secondary | ICD-10-CM | POA: Diagnosis not present

## 2024-08-21 DIAGNOSIS — Z125 Encounter for screening for malignant neoplasm of prostate: Secondary | ICD-10-CM

## 2024-08-21 DIAGNOSIS — E1169 Type 2 diabetes mellitus with other specified complication: Secondary | ICD-10-CM

## 2024-08-21 LAB — LIPID PANEL
Cholesterol: 93 mg/dL (ref 28–200)
HDL: 32.8 mg/dL — ABNORMAL LOW
LDL Cholesterol: 35 mg/dL (ref 10–99)
NonHDL: 60.14
Total CHOL/HDL Ratio: 3
Triglycerides: 124 mg/dL (ref 10.0–149.0)
VLDL: 24.8 mg/dL (ref 0.0–40.0)

## 2024-08-21 LAB — MICROALBUMIN / CREATININE URINE RATIO
Creatinine,U: 69.8 mg/dL
Microalb Creat Ratio: 11.2 mg/g (ref 0.0–30.0)
Microalb, Ur: 0.8 mg/dL (ref 0.7–1.9)

## 2024-08-21 LAB — HEMOGLOBIN A1C: Hgb A1c MFr Bld: 7 % — ABNORMAL HIGH (ref 4.6–6.5)

## 2024-08-21 LAB — COMPREHENSIVE METABOLIC PANEL WITH GFR
ALT: 33 U/L (ref 3–53)
AST: 22 U/L (ref 5–37)
Albumin: 4.5 g/dL (ref 3.5–5.2)
Alkaline Phosphatase: 43 U/L (ref 39–117)
BUN: 13 mg/dL (ref 6–23)
CO2: 27 meq/L (ref 19–32)
Calcium: 9.6 mg/dL (ref 8.4–10.5)
Chloride: 99 meq/L (ref 96–112)
Creatinine, Ser: 0.84 mg/dL (ref 0.40–1.50)
GFR: 90.4 mL/min
Glucose, Bld: 131 mg/dL — ABNORMAL HIGH (ref 70–99)
Potassium: 4.1 meq/L (ref 3.5–5.1)
Sodium: 135 meq/L (ref 135–145)
Total Bilirubin: 0.5 mg/dL (ref 0.2–1.2)
Total Protein: 7.3 g/dL (ref 6.0–8.3)

## 2024-08-21 LAB — CBC WITH DIFFERENTIAL/PLATELET
Basophils Absolute: 0.1 10*3/uL (ref 0.0–0.1)
Basophils Relative: 0.9 % (ref 0.0–3.0)
Eosinophils Absolute: 0.2 10*3/uL (ref 0.0–0.7)
Eosinophils Relative: 2.6 % (ref 0.0–5.0)
HCT: 48 % (ref 39.0–52.0)
Hemoglobin: 16 g/dL (ref 13.0–17.0)
Lymphocytes Relative: 19.2 % (ref 12.0–46.0)
Lymphs Abs: 1.6 10*3/uL (ref 0.7–4.0)
MCHC: 33.3 g/dL (ref 30.0–36.0)
MCV: 86.9 fl (ref 78.0–100.0)
Monocytes Absolute: 1 10*3/uL (ref 0.1–1.0)
Monocytes Relative: 12.1 % — ABNORMAL HIGH (ref 3.0–12.0)
Neutro Abs: 5.4 10*3/uL (ref 1.4–7.7)
Neutrophils Relative %: 65.2 % (ref 43.0–77.0)
Platelets: 190 10*3/uL (ref 150.0–400.0)
RBC: 5.52 Mil/uL (ref 4.22–5.81)
RDW: 14.9 % (ref 11.5–15.5)
WBC: 8.3 10*3/uL (ref 4.0–10.5)

## 2024-08-22 ENCOUNTER — Ambulatory Visit: Payer: Self-pay | Admitting: Family Medicine

## 2024-08-22 LAB — VITAMIN B12: Vitamin B-12: 657 pg/mL (ref 211–911)

## 2024-08-22 LAB — PSA: PSA: 0.99 ng/mL (ref 0.10–4.00)

## 2024-08-27 ENCOUNTER — Encounter: Payer: Self-pay | Admitting: Family Medicine

## 2024-08-27 ENCOUNTER — Ambulatory Visit: Admitting: Family Medicine

## 2024-08-27 VITALS — BP 120/70 | HR 77 | Temp 98.0°F | Ht 68.0 in | Wt 251.4 lb

## 2024-08-27 DIAGNOSIS — Z7189 Other specified counseling: Secondary | ICD-10-CM

## 2024-08-27 DIAGNOSIS — E785 Hyperlipidemia, unspecified: Secondary | ICD-10-CM

## 2024-08-27 DIAGNOSIS — E1169 Type 2 diabetes mellitus with other specified complication: Secondary | ICD-10-CM | POA: Diagnosis not present

## 2024-08-27 DIAGNOSIS — R0981 Nasal congestion: Secondary | ICD-10-CM | POA: Diagnosis not present

## 2024-08-27 DIAGNOSIS — I10 Essential (primary) hypertension: Secondary | ICD-10-CM

## 2024-08-27 DIAGNOSIS — Z23 Encounter for immunization: Secondary | ICD-10-CM

## 2024-08-27 DIAGNOSIS — K760 Fatty (change of) liver, not elsewhere classified: Secondary | ICD-10-CM | POA: Diagnosis not present

## 2024-08-27 DIAGNOSIS — I2510 Atherosclerotic heart disease of native coronary artery without angina pectoris: Secondary | ICD-10-CM

## 2024-08-27 DIAGNOSIS — M542 Cervicalgia: Secondary | ICD-10-CM

## 2024-08-27 DIAGNOSIS — G8929 Other chronic pain: Secondary | ICD-10-CM | POA: Diagnosis not present

## 2024-08-27 DIAGNOSIS — Z0000 Encounter for general adult medical examination without abnormal findings: Secondary | ICD-10-CM

## 2024-08-27 DIAGNOSIS — K219 Gastro-esophageal reflux disease without esophagitis: Secondary | ICD-10-CM

## 2024-08-27 MED ORDER — FLUTICASONE PROPIONATE 50 MCG/ACT NA SUSP: 2.0000 | Freq: Every day | NASAL | 6 refills | Status: AC

## 2024-08-27 MED ORDER — METFORMIN HCL 500 MG PO TABS: 500.0000 mg | ORAL_TABLET | Freq: Two times a day (BID) | ORAL | 3 refills | Status: AC

## 2024-08-27 NOTE — Assessment & Plan Note (Addendum)
 Noted on previous imaging. LFTs remain normal.   Fibrosis 4 Score = 1.35 Fib-4 interpretation is not validated for people under 35 or over 68 years of age. However, scores under 2.0 are generally considered low risk.

## 2024-08-27 NOTE — Assessment & Plan Note (Signed)
"  Chronic, stable.  Continue current regimen.  "

## 2024-08-27 NOTE — Assessment & Plan Note (Signed)
 Chronic, stable. Continue rosuvastatin . The ASCVD Risk score (Arnett DK, et al., 2019) failed to calculate for the following reasons:   The valid total cholesterol range is 130 to 320 mg/dL

## 2024-08-27 NOTE — Assessment & Plan Note (Signed)
 Preventative protocols reviewed and updated unless pt declined. Discussed healthy diet and lifestyle.

## 2024-08-27 NOTE — Assessment & Plan Note (Signed)
 Acute exacerbation after falls out of bed x2.  Seen once at ER for this with reassuring head and neck CT imaging.  Update if ongoing pain to consider re-imaging with rpt neck CT. Pt agrees with plan.   Consider REM sleep behavior disorder.

## 2024-08-27 NOTE — Assessment & Plan Note (Signed)
 Advanced directive - working on this. GF Donna Canicci (in [place] ) to be HCPOA. Full code, but would not want prolonged life support if terminal condition.

## 2024-08-27 NOTE — Assessment & Plan Note (Addendum)
 Chronic, deteriorated but still controlled. 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  Some concern with increased cost of drugs this year 2026 - discussed options including online pharmacy, switching drug based on insurance preference or PAP if eligible - he will continue current regimen as is.

## 2024-08-27 NOTE — Patient Instructions (Addendum)
 Prevnar-20 today  Check with insurance on coverage for ozempic  vs trulicity vs mounjaro (which is most affordable).  Bring us  a copy of your living will to update your chart.  May try flonase  (fluticasone ) nasal steroid as needed, hold if nosebleed - sent to pharmacy Good to see you today.  Return as needed or in 6 months for follow up visit   [email or web address] - online website

## 2024-08-27 NOTE — Assessment & Plan Note (Signed)
 Appreciate cardiology care S/p 2 stents Continues plavix  alone.

## 2024-11-26 ENCOUNTER — Other Ambulatory Visit

## 2025-02-25 ENCOUNTER — Ambulatory Visit: Admitting: Family Medicine

## 2025-05-06 ENCOUNTER — Ambulatory Visit: Admitting: Dermatology

## 2025-08-29 ENCOUNTER — Ambulatory Visit

## 2025-08-29 ENCOUNTER — Encounter: Admitting: Family Medicine
